# Patient Record
Sex: Male | Born: 1945 | Race: White | Hispanic: No | Smoking: Former smoker
Health system: Southern US, Community
[De-identification: ages and names within clinical notes are randomized; demographics above are authoritative.]

## PROBLEM LIST (undated history)

## (undated) DIAGNOSIS — C801 Malignant (primary) neoplasm, unspecified: Secondary | ICD-10-CM

## (undated) DIAGNOSIS — L309 Dermatitis, unspecified: Secondary | ICD-10-CM

## (undated) DIAGNOSIS — R42 Dizziness and giddiness: Secondary | ICD-10-CM

## (undated) DIAGNOSIS — K635 Polyp of colon: Secondary | ICD-10-CM

## (undated) DIAGNOSIS — I639 Cerebral infarction, unspecified: Secondary | ICD-10-CM

## (undated) DIAGNOSIS — I6529 Occlusion and stenosis of unspecified carotid artery: Secondary | ICD-10-CM

## (undated) DIAGNOSIS — I10 Essential (primary) hypertension: Secondary | ICD-10-CM

## (undated) DIAGNOSIS — J45909 Unspecified asthma, uncomplicated: Secondary | ICD-10-CM

## (undated) DIAGNOSIS — J9819 Other pulmonary collapse: Secondary | ICD-10-CM

## (undated) DIAGNOSIS — K219 Gastro-esophageal reflux disease without esophagitis: Secondary | ICD-10-CM

## (undated) DIAGNOSIS — J302 Other seasonal allergic rhinitis: Secondary | ICD-10-CM

## (undated) DIAGNOSIS — M1990 Unspecified osteoarthritis, unspecified site: Secondary | ICD-10-CM

## (undated) HISTORY — PX: HERNIA REPAIR: SHX51

## (undated) HISTORY — DX: Cerebral infarction, unspecified: I63.9

## (undated) HISTORY — DX: Occlusion and stenosis of unspecified carotid artery: I65.29

## (undated) HISTORY — PX: OTHER SURGICAL HISTORY: SHX169

## (undated) HISTORY — DX: Polyp of colon: K63.5

## (undated) HISTORY — DX: Gastro-esophageal reflux disease without esophagitis: K21.9

## (undated) HISTORY — DX: Unspecified osteoarthritis, unspecified site: M19.90

## (undated) HISTORY — PX: PROSTATE SURGERY: SHX751

## (undated) HISTORY — DX: Dermatitis, unspecified: L30.9

## (undated) HISTORY — DX: Dizziness and giddiness: R42

---

## 2007-01-15 DIAGNOSIS — R972 Elevated prostate specific antigen [PSA]: Secondary | ICD-10-CM | POA: Insufficient documentation

## 2009-01-11 DIAGNOSIS — M129 Arthropathy, unspecified: Secondary | ICD-10-CM | POA: Insufficient documentation

## 2009-07-23 DIAGNOSIS — R310 Gross hematuria: Secondary | ICD-10-CM | POA: Insufficient documentation

## 2009-07-27 DIAGNOSIS — N281 Cyst of kidney, acquired: Secondary | ICD-10-CM | POA: Insufficient documentation

## 2009-08-09 DIAGNOSIS — N35919 Unspecified urethral stricture, male, unspecified site: Secondary | ICD-10-CM | POA: Insufficient documentation

## 2009-08-09 DIAGNOSIS — R319 Hematuria, unspecified: Secondary | ICD-10-CM | POA: Insufficient documentation

## 2011-06-27 DIAGNOSIS — K429 Umbilical hernia without obstruction or gangrene: Secondary | ICD-10-CM | POA: Insufficient documentation

## 2011-08-09 DIAGNOSIS — Z860101 Personal history of adenomatous and serrated colon polyps: Secondary | ICD-10-CM | POA: Insufficient documentation

## 2012-02-17 ENCOUNTER — Emergency Department (HOSPITAL_COMMUNITY): Payer: Medicare HMO

## 2012-02-17 ENCOUNTER — Emergency Department (HOSPITAL_COMMUNITY)
Admission: EM | Admit: 2012-02-17 | Discharge: 2012-02-17 | Disposition: A | Discharge status: Home / Self Care | Payer: Medicare HMO | Attending: Emergency Medicine | Admitting: Emergency Medicine

## 2012-02-17 ENCOUNTER — Encounter (HOSPITAL_COMMUNITY): Payer: Self-pay

## 2012-02-17 DIAGNOSIS — J329 Chronic sinusitis, unspecified: Secondary | ICD-10-CM

## 2012-02-17 DIAGNOSIS — Z8709 Personal history of other diseases of the respiratory system: Secondary | ICD-10-CM | POA: Insufficient documentation

## 2012-02-17 DIAGNOSIS — I10 Essential (primary) hypertension: Secondary | ICD-10-CM | POA: Insufficient documentation

## 2012-02-17 DIAGNOSIS — Z79899 Other long term (current) drug therapy: Secondary | ICD-10-CM | POA: Insufficient documentation

## 2012-02-17 DIAGNOSIS — Z5189 Encounter for other specified aftercare: Secondary | ICD-10-CM

## 2012-02-17 DIAGNOSIS — J45909 Unspecified asthma, uncomplicated: Secondary | ICD-10-CM | POA: Insufficient documentation

## 2012-02-17 DIAGNOSIS — L089 Local infection of the skin and subcutaneous tissue, unspecified: Secondary | ICD-10-CM

## 2012-02-17 DIAGNOSIS — J019 Acute sinusitis, unspecified: Secondary | ICD-10-CM | POA: Insufficient documentation

## 2012-02-17 HISTORY — DX: Unspecified asthma, uncomplicated: J45.909

## 2012-02-17 HISTORY — DX: Essential (primary) hypertension: I10

## 2012-02-17 HISTORY — DX: Other pulmonary collapse: J98.19

## 2012-02-17 HISTORY — DX: Other seasonal allergic rhinitis: J30.2

## 2012-02-17 LAB — CBC WITH DIFFERENTIAL/PLATELET
HCT: 42.3 % (ref 39.0–52.0)
Hemoglobin: 14.3 g/dL (ref 13.0–17.0)
Lymphocytes Relative: 12 % (ref 12–46)
Lymphs Abs: 1 10*3/uL (ref 0.7–4.0)
Monocytes Absolute: 1 10*3/uL (ref 0.1–1.0)
Monocytes Relative: 12 % (ref 3–12)
Neutro Abs: 6.2 10*3/uL (ref 1.7–7.7)
WBC: 8.4 10*3/uL (ref 4.0–10.5)

## 2012-02-17 LAB — BASIC METABOLIC PANEL
CO2: 25 mEq/L (ref 19–32)
Chloride: 97 mEq/L (ref 96–112)
Glucose, Bld: 101 mg/dL — ABNORMAL HIGH (ref 70–99)
Sodium: 132 mEq/L — ABNORMAL LOW (ref 135–145)

## 2012-02-17 LAB — MRSA PCR SCREENING: MRSA by PCR: NEGATIVE

## 2012-02-17 MED ORDER — MUPIROCIN CALCIUM 2 % EX CREA: TOPICAL_CREAM | Freq: Three times a day (TID) | CUTANEOUS | Status: DC

## 2012-02-17 MED ORDER — HYDROXYZINE HCL 25 MG PO TABS: 25.0000 mg | ORAL_TABLET | Freq: Four times a day (QID) | ORAL | Status: DC

## 2012-02-17 MED ORDER — HYDROXYZINE HCL 25 MG PO TABS
25.0000 mg | ORAL_TABLET | Freq: Once | ORAL | Status: AC
  Administered 2012-02-17: 25 mg via ORAL
  Filled 2012-02-17: qty 1

## 2012-02-17 MED ORDER — IOHEXOL 300 MG/ML  SOLN
80.0000 mL | Freq: Once | INTRAMUSCULAR | Status: AC | PRN
  Administered 2012-02-17: 80 mL via INTRAVENOUS

## 2012-02-17 MED ORDER — SODIUM CHLORIDE 0.9 % IV SOLN
Freq: Once | INTRAVENOUS | Status: AC
  Administered 2012-02-17: 11:00:00 via INTRAVENOUS

## 2012-02-17 NOTE — ED Notes (Signed)
Sent by pmd for wound check, pt has wound to face, has been treated by multiple antibiotics, has wound that started out in nose and appears to be spreading to face and into lip area. Wbc's are getting high.

## 2012-02-17 NOTE — ED Notes (Signed)
Patient states that he was referred to the ER from Dr. Gardiner Coins office.  States that he seen his doctor this am, yesterday and last Wednesday.  States that he has been on several different medications, Bactrim, Kelflex, (Valtrex and Rocephin yesterday).

## 2012-02-17 NOTE — ED Provider Notes (Signed)
History     CSN: [id number]  Arrival date & time 02/17/12  0921   First MD Initiated Contact with Patient 02/17/12 [phone]      Chief Complaint  Patient presents with  . Wound Check    (Consider location/radiation/quality/duration/timing/severity/associated sxs/prior treatment) Patient is a 67 y.o. male presenting with wound check. The history is provided by the patient.  Wound Check This is a new problem. The problem has been gradually worsening.    Past Medical History  Diagnosis Date  . Hypertension   . Asthma   . Collapse of lung   . Seasonal allergies     Past Surgical History  Procedure Laterality Date  . Hernia repair    . Prostate surgery      No family history on file.  History  Substance Use Topics  . Smoking status: Never Smoker   . Smokeless tobacco: Not on file  . Alcohol Use: No      Review of Systems  Allergies  Review of patient's allergies indicates not on file.  Home Medications  No current outpatient prescriptions on file.  BP 140/74  Pulse 88  Temp(Src) 98.3 F (36.8 C) (Oral)  Resp 18  Ht 5\' 9"  (1.753 m)  Wt 170 lb (77.111 kg)  BMI 25.09 kg/m2  SpO2 99%  Physical Exam  Nursing note and vitals reviewed. Constitutional: He is oriented to person, place, and time. He appears well-developed and well-nourished.  Non-toxic appearance.  HENT:  Head: Normocephalic.  Right Ear: Tympanic membrane and external ear normal.  Left Ear: Tympanic membrane and external ear normal.  Pt has a small wound that has scabbed in the left nare. No active bleeding.The nose is sore There is increase redness of the face and upper lip. The face is warm but not hot. No ear involvement.  Eyes: Conjunctivae, EOM and lids are normal. Pupils are equal, round, and reactive to light. Right eye exhibits no discharge. Left eye exhibits no discharge.  Neck: Normal range of motion. Neck supple. Carotid bruit is not present.  Cardiovascular: Normal rate, regular  rhythm, normal heart sounds, intact distal pulses and normal pulses.   Pulmonary/Chest: Breath sounds normal. No respiratory distress.  Abdominal: Soft. Bowel sounds are normal. There is no tenderness. There is no guarding.  Musculoskeletal: Normal range of motion.  Lymphadenopathy:       Head (right side): No submandibular adenopathy present.       Head (left side): No submandibular adenopathy present.    He has no cervical adenopathy.  Neurological: He is alert and oriented to person, place, and time. He has normal strength. No cranial nerve deficit or sensory deficit.  Skin: Skin is warm and dry.  Psychiatric: He has a normal mood and affect. His speech is normal.    ED Course  Procedures (including critical care time)  Labs Reviewed - No data to display No results found.   No diagnosis found.    MDM  I have reviewed nursing notes, vital signs, and all appropriate lab and imaging results for this patient. MRSA screen is negative.(pt has been on 3 days of septra)  CBC wnl.  CT Maxillofacial  Questions soft tissue swelling of the chin. Sinusitis noted, but no air fluid levels. No abscess. Plan to finish the septra. Add bactroban to face and chin. Vistaril for itching. Pt to return if not improving.        Kathie Dike, [place] 02/19/12 [phone]

## 2012-02-19 NOTE — ED Provider Notes (Signed)
Medical screening examination/treatment/procedure(s) were performed by non-physician practitioner and as supervising physician I was immediately available for consultation/collaboration.   Carleene Cooper III, MD 02/19/12 [phone]

## 2012-04-16 ENCOUNTER — Other Ambulatory Visit: Payer: Self-pay | Admitting: *Deleted

## 2012-04-16 MED ORDER — OMEPRAZOLE 20 MG PO CPDR: 20.0000 mg | DELAYED_RELEASE_CAPSULE | Freq: Two times a day (BID) | ORAL | Status: DC

## 2012-04-16 NOTE — Telephone Encounter (Signed)
Requesting refill. Original rx given on 03-19-12 by [facility] for #[id number]. Please advise

## 2012-05-20 ENCOUNTER — Ambulatory Visit (INDEPENDENT_AMBULATORY_CARE_PROVIDER_SITE_OTHER): Payer: Medicare HMO | Admitting: Nurse Practitioner

## 2012-05-20 ENCOUNTER — Encounter: Payer: Self-pay | Admitting: Nurse Practitioner

## 2012-05-20 VITALS — BP 135/77 | HR 64 | Temp 97.0°F | Ht 69.0 in | Wt 175.0 lb

## 2012-05-20 DIAGNOSIS — K2970 Gastritis, unspecified, without bleeding: Secondary | ICD-10-CM

## 2012-05-20 DIAGNOSIS — B9681 Helicobacter pylori [H. pylori] as the cause of diseases classified elsewhere: Secondary | ICD-10-CM

## 2012-05-20 DIAGNOSIS — K219 Gastro-esophageal reflux disease without esophagitis: Secondary | ICD-10-CM

## 2012-05-20 DIAGNOSIS — H8110 Benign paroxysmal vertigo, unspecified ear: Secondary | ICD-10-CM

## 2012-05-20 DIAGNOSIS — J45909 Unspecified asthma, uncomplicated: Secondary | ICD-10-CM

## 2012-05-20 DIAGNOSIS — I10 Essential (primary) hypertension: Secondary | ICD-10-CM

## 2012-05-20 DIAGNOSIS — A048 Other specified bacterial intestinal infections: Secondary | ICD-10-CM

## 2012-05-20 DIAGNOSIS — M1990 Unspecified osteoarthritis, unspecified site: Secondary | ICD-10-CM

## 2012-05-20 DIAGNOSIS — N400 Enlarged prostate without lower urinary tract symptoms: Secondary | ICD-10-CM

## 2012-05-20 DIAGNOSIS — L309 Dermatitis, unspecified: Secondary | ICD-10-CM | POA: Insufficient documentation

## 2012-05-20 DIAGNOSIS — L259 Unspecified contact dermatitis, unspecified cause: Secondary | ICD-10-CM

## 2012-05-20 DIAGNOSIS — L4050 Arthropathic psoriasis, unspecified: Secondary | ICD-10-CM | POA: Insufficient documentation

## 2012-05-20 DIAGNOSIS — E291 Testicular hypofunction: Secondary | ICD-10-CM

## 2012-05-20 LAB — COMPLETE METABOLIC PANEL WITH GFR
ALT: 22 U/L (ref 0–53)
BUN: 17 mg/dL (ref 6–23)
CO2: 28 mEq/L (ref 19–32)
Calcium: 10.7 mg/dL — ABNORMAL HIGH (ref 8.4–10.5)
Chloride: 98 mEq/L (ref 96–112)
Creat: 0.96 mg/dL (ref 0.50–1.35)
GFR, Est African American: >89 mL/min
Glucose, Bld: 98 mg/dL (ref 70–99)

## 2012-05-20 MED ORDER — FLUTICASONE PROPIONATE 50 MCG/ACT NA SUSP: 2.0000 | Freq: Two times a day (BID) | NASAL | Status: DC

## 2012-05-20 MED ORDER — LOSARTAN POTASSIUM-HCTZ 100-25 MG PO TABS: 1.0000 | ORAL_TABLET | Freq: Every day | ORAL | Status: DC

## 2012-05-20 MED ORDER — TRAMADOL HCL 50 MG PO TABS: 50.0000 mg | ORAL_TABLET | Freq: Four times a day (QID) | ORAL | Status: DC | PRN

## 2012-05-20 MED ORDER — MECLIZINE HCL 25 MG PO TABS: 25.0000 mg | ORAL_TABLET | Freq: Three times a day (TID) | ORAL | Status: DC | PRN

## 2012-05-20 MED ORDER — CLOBETASOL PROPIONATE 0.05 % EX CREA: 1.0000 "application " | TOPICAL_CREAM | Freq: Every day | CUTANEOUS | Status: DC | PRN

## 2012-05-20 MED ORDER — TESTOSTERONE 10 MG/ACT (2%) TD GEL: 1.0000 "application " | Freq: Every day | TRANSDERMAL | Status: DC

## 2012-05-20 MED ORDER — OMEPRAZOLE 20 MG PO CPDR: 20.0000 mg | DELAYED_RELEASE_CAPSULE | Freq: Two times a day (BID) | ORAL | Status: DC

## 2012-05-20 NOTE — Patient Instructions (Signed)
Health Maintenance, Males A healthy lifestyle and preventative care can promote health and wellness.  Maintain regular health, dental, and eye exams.  Eat a healthy diet. Foods like vegetables, fruits, whole grains, low-fat dairy products, and lean protein foods contain the nutrients you need without too many calories. Decrease your intake of foods high in solid fats, added sugars, and salt. Get information about a proper diet from your caregiver, if necessary.  Regular physical exercise is one of the most important things you can do for your health. Most adults should get at least 150 minutes of moderate-intensity exercise (any activity that increases your heart rate and causes you to sweat) each week. In addition, most adults need muscle-strengthening exercises on 2 or more days a week.   Maintain a healthy weight. The body mass index (BMI) is a screening tool to identify possible weight problems. It provides an estimate of body fat based on height and weight. Your caregiver can help determine your BMI, and can help you achieve or maintain a healthy weight. For adults 20 years and older:  A BMI below 18.5 is considered underweight.  A BMI of 18.5 to 24.9 is normal.  A BMI of 25 to 29.9 is considered overweight.  A BMI of 30 and above is considered obese.  Maintain normal blood lipids and cholesterol by exercising and minimizing your intake of saturated fat. Eat a balanced diet with plenty of fruits and vegetables. Blood tests for lipids and cholesterol should begin at age 20 and be repeated every 5 years. If your lipid or cholesterol levels are high, you are over 50, or you are a high risk for heart disease, you may need your cholesterol levels checked more frequently.Ongoing high lipid and cholesterol levels should be treated with medicines, if diet and exercise are not effective.  If you smoke, find out from your caregiver how to quit. If you do not use tobacco, do not start.  If you  choose to drink alcohol, do not exceed 2 drinks per day. One drink is considered to be 12 ounces (355 mL) of beer, 5 ounces (148 mL) of wine, or 1.5 ounces (44 mL) of liquor.  Avoid use of street drugs. Do not share needles with anyone. Ask for help if you need support or instructions about stopping the use of drugs.  High blood pressure causes heart disease and increases the risk of stroke. Blood pressure should be checked at least every 1 to 2 years. Ongoing high blood pressure should be treated with medicines if weight loss and exercise are not effective.  If you are 45 to 69 years old, ask your caregiver if you should take aspirin to prevent heart disease.  Diabetes screening involves taking a blood sample to check your fasting blood sugar level. This should be done once every 3 years, after age 45, if you are within normal weight and without risk factors for diabetes. Testing should be considered at a younger age or be carried out more frequently if you are overweight and have at least 1 risk factor for diabetes.  Colorectal cancer can be detected and often prevented. Most routine colorectal cancer screening begins at the age of 50 and continues through age 75. However, your caregiver may recommend screening at an earlier age if you have risk factors for colon cancer. On a yearly basis, your caregiver may provide home test kits to check for hidden blood in the stool. Use of a small camera at the end of a tube,   to directly examine the colon (sigmoidoscopy or colonoscopy), can detect the earliest forms of colorectal cancer. Talk to your caregiver about this at age 50, when routine screening begins. Direct examination of the colon should be repeated every 5 to 10 years through age 75, unless early forms of pre-cancerous polyps or small growths are found.  Hepatitis C blood testing is recommended for all people born from 1945 through 1965 and any individual with known risks for hepatitis C.  Healthy  men should no longer receive prostate-specific antigen (PSA) blood tests as part of routine cancer screening. Consult with your caregiver about prostate cancer screening.  Testicular cancer screening is not recommended for adolescents or adult males who have no symptoms. Screening includes self-exam, caregiver exam, and other screening tests. Consult with your caregiver about any symptoms you have or any concerns you have about testicular cancer.  Practice safe sex. Use condoms and avoid high-risk sexual practices to reduce the spread of sexually transmitted infections (STIs).  Use sunscreen with a sun protection factor (SPF) of 30 or greater. Apply sunscreen liberally and repeatedly throughout the day. You should seek shade when your shadow is shorter than you. Protect yourself by wearing long sleeves, pants, a wide-brimmed hat, and sunglasses year round, whenever you are outdoors.  Notify your caregiver of new moles or changes in moles, especially if there is a change in shape or color. Also notify your caregiver if a mole is larger than the size of a pencil eraser.  A one-time screening for abdominal aortic aneurysm (AAA) and surgical repair of large AAAs by sound wave imaging (ultrasonography) is recommended for ages 65 to 75 years who are current or former smokers.  Stay current with your immunizations. Document Released: 06/24/2007 Document Revised: 03/20/2011 Document Reviewed: 05/23/2010 [employer] Patient Information 2013 [employer].  

## 2012-05-20 NOTE — Progress Notes (Signed)
Subjective:    Patient ID: Kamerin Grumbine, male    DOB: 1945-05-24, 67 y.o.   MRN: [id number]  Hypertension This is a chronic problem. The current episode started more than 1 year ago. The problem is unchanged. The problem is uncontrolled. Associated symptoms include peripheral edema. Pertinent negatives include no chest pain, headaches, neck pain or shortness of breath. There are no associated agents to hypertension. Risk factors for coronary artery disease include male gender. Past treatments include angiotensin blockers. The current treatment provides significant improvement. There are no compliance problems.   Gastrophageal Reflux He complains of belching. He reports no chest pain, no coughing, no dysphagia, no heartburn, no hoarse voice or no nausea. This is a chronic problem. Episode frequency: but did have a blotting sensation over the weekend. The problem has been unchanged. Pertinent negatives include no melena, muscle weakness or weight loss. He has tried a PPI for the symptoms. The treatment provided significant relief. Past procedures include an EGD (y-6).  allergic rhinitis Patient used flonase in the past. They thought he was having allergic reaction to it. But come to find out it was reaction to face masks that he was wearing. Patient says that he has been sneezing a lot from working outside.  Asthma Aggravated by outside allergens. Worse since being off flonase BPH Recently had a TURP and says that he still is having problems urinating. Eczema Worsening- Wants to see dermatologist Osteo arthritis Worsening- Has pain all the time despite using ultram- Wants to see specialist Vertigo  meclizine helps but patient is out of meds   Review of Systems  Constitutional: Negative for weight loss.  HENT: Negative for hoarse voice and neck pain.   Respiratory: Negative for cough and shortness of breath.   Cardiovascular: Negative for chest pain.  Gastrointestinal: Negative for heartburn,  dysphagia, nausea and melena.  Musculoskeletal: Negative for muscle weakness.  Neurological: Negative for headaches.  All other systems reviewed and are negative.       Objective:   Physical Exam  Constitutional: He is oriented to person, place, and time. He appears well-developed and well-nourished.  HENT:  Head: Normocephalic.  Right Ear: External ear normal.  Left Ear: External ear normal.  Nose: Nose normal.  Mouth/Throat: Oropharynx is clear and moist.  Eyes: EOM are normal. Pupils are equal, round, and reactive to light.  Neck: Normal range of motion. Neck supple. No thyromegaly present.  Cardiovascular: Normal rate, regular rhythm, normal heart sounds and intact distal pulses.   No murmur heard. Pulmonary/Chest: Effort normal and breath sounds normal. He has no wheezes. He has no rales.  Abdominal: Soft. Bowel sounds are normal.  Musculoskeletal: Normal range of motion.  Neurological: He is alert and oriented to person, place, and time.  Skin: Skin is warm and dry.  Psychiatric: He has a normal mood and affect. His behavior is normal. Judgment and thought content normal.   BP 135/77  Pulse 64  Temp(Src) 97 F (36.1 C) (Oral)  Ht 5\' 9"  (1.753 m)  Wt 175 lb (79.379 kg)  BMI 25.83 kg/m2        Assessment & Plan:  1. GERD (gastroesophageal reflux disease) Avoid spicy and fatty foods Do not eat 2 hours prior to bedtime - omeprazole (PRILOSEC) 20 MG capsule; Take 1 capsule (20 mg total) by mouth 2 (two) times daily.  Dispense: 30 capsule; Refill: 2  2. Hypertension Low Na+ diet encouraged - losartan-hydrochlorothiazide (HYZAAR) 100-25 MG per tablet; Take 1 tablet by mouth daily.  Dispense: 30 tablet; Refill: 5 - COMPLETE METABOLIC PANEL WITH GFR - NMR Lipoprofile with Lipids  3. Eczema Avoid scratching  Avoid hot showers or baths - clobetasol cream (TEMOVATE) 0.05 %; Apply 1 application topically daily as needed (for areas on back).  Dispense: 30 g; Refill:  5 - Ambulatory referral to Dermatology  4. Asthma, chronic Avoid allergens - fluticasone (FLONASE) 50 MCG/ACT nasal spray; Place 2 sprays into the nose 2 (two) times daily.  Dispense: 1 g; Refill: 5  5. BPH (benign prostatic hyperplasia) Follow up withurologist  6. Osteoarthritis Rest when hurting - traMADol (ULTRAM) 50 MG tablet; Take 1 tablet (50 mg total) by mouth every 6 (six) hours as needed for pain.  Dispense: 30 tablet; Refill: 1 - Ambulatory referral to Rheumatology  7. Benign paroxysmal positional vertigo Force fluids - meclizine (ANTIVERT) 25 MG tablet; Take 1 tablet (25 mg total) by mouth 3 (three) times daily as needed.  Dispense: 30 tablet; Refill: 3  9. Hypogonadism male  - Testosterone 10 MG/ACT (2%) GEL; Place 1 application onto the skin daily.  Dispense: 60 g; Refill: 3  Mary-Margaret Daphine Deutscher, FNP

## 2012-05-22 ENCOUNTER — Other Ambulatory Visit: Payer: Self-pay | Admitting: Nurse Practitioner

## 2012-05-22 LAB — NMR LIPOPROFILE WITH LIPIDS
HDL Particle Number: 31.4 umol/L (ref >=30.5)
LDL (calc): 111 mg/dL — ABNORMAL HIGH (ref <100)
LDL Particle Number: 1671 nmol/L — ABNORMAL HIGH (ref <1000)
LDL Size: 20.6 nm (ref >20.5)
LP-IR Score: 76 — ABNORMAL HIGH (ref <=45)
Small LDL Particle Number: 749 nmol/L — ABNORMAL HIGH (ref <=527)

## 2012-05-22 MED ORDER — ATORVASTATIN CALCIUM 40 MG PO TABS: 40.0000 mg | ORAL_TABLET | Freq: Every day | ORAL | Status: DC

## 2012-06-15 ENCOUNTER — Other Ambulatory Visit: Payer: Self-pay | Admitting: Nurse Practitioner

## 2012-07-24 ENCOUNTER — Other Ambulatory Visit: Payer: Self-pay | Admitting: Nurse Practitioner

## 2012-07-25 ENCOUNTER — Other Ambulatory Visit: Payer: Self-pay | Admitting: Nurse Practitioner

## 2012-07-25 NOTE — Telephone Encounter (Signed)
Last seen and filled 05/20/12

## 2012-08-27 ENCOUNTER — Other Ambulatory Visit: Payer: Self-pay | Admitting: Nurse Practitioner

## 2012-08-28 NOTE — Telephone Encounter (Signed)
rx ready for pickup 

## 2012-08-28 NOTE — Telephone Encounter (Signed)
Last seen 05/20/12 MMM Last filled 07/25/12   If approved print and route to nurse

## 2012-08-29 NOTE — Telephone Encounter (Signed)
Called to [facility] 

## 2012-09-30 ENCOUNTER — Other Ambulatory Visit: Payer: Self-pay | Admitting: Nurse Practitioner

## 2012-10-01 NOTE — Telephone Encounter (Signed)
Last seen 05/20/12, last filled 08/27/12, Rx will print, nurse to call pt to pickup

## 2012-10-01 NOTE — Telephone Encounter (Signed)
Last refill without being seen 

## 2012-10-03 ENCOUNTER — Other Ambulatory Visit: Payer: Self-pay | Admitting: Nurse Practitioner

## 2012-10-03 DIAGNOSIS — E291 Testicular hypofunction: Secondary | ICD-10-CM

## 2012-10-03 NOTE — Telephone Encounter (Signed)
Last seen 05/20/12  Rodney Hernandez  If approved print and route to nurse

## 2012-10-03 NOTE — Telephone Encounter (Signed)
Up front 

## 2012-10-04 ENCOUNTER — Telehealth: Payer: Self-pay | Admitting: *Deleted

## 2012-10-04 MED ORDER — TESTOSTERONE 10 MG/ACT (2%) TD GEL: 1.0000 "application " | Freq: Every day | TRANSDERMAL | Status: DC

## 2012-10-04 NOTE — Telephone Encounter (Signed)
Script for Tramadol ready but number busy.

## 2012-10-07 ENCOUNTER — Telehealth: Payer: Self-pay | Admitting: Nurse Practitioner

## 2012-10-07 DIAGNOSIS — E291 Testicular hypofunction: Secondary | ICD-10-CM

## 2012-10-08 MED ORDER — TESTOSTERONE 10 MG/ACT (2%) TD GEL: 1.0000 "application " | Freq: Every day | TRANSDERMAL | Status: DC

## 2012-10-08 NOTE — Telephone Encounter (Signed)
Testosterone rx ready for pick up  

## 2012-10-08 NOTE — Telephone Encounter (Signed)
Number keeps RX up front

## 2012-10-10 ENCOUNTER — Telehealth: Payer: Self-pay | Admitting: Nurse Practitioner

## 2012-10-10 NOTE — Telephone Encounter (Signed)
Patient aware.

## 2012-10-14 ENCOUNTER — Telehealth: Payer: Self-pay | Admitting: General Practice

## 2012-10-14 NOTE — Telephone Encounter (Signed)
Should be 1 application daily

## 2012-10-14 NOTE — Telephone Encounter (Signed)
Pharmacy aware

## 2012-11-06 NOTE — Telephone Encounter (Signed)
No response from patient

## 2012-11-11 ENCOUNTER — Other Ambulatory Visit: Payer: Self-pay | Admitting: Nurse Practitioner

## 2012-11-29 ENCOUNTER — Other Ambulatory Visit: Payer: Self-pay | Admitting: Nurse Practitioner

## 2012-12-04 ENCOUNTER — Telehealth: Payer: Self-pay | Admitting: Nurse Practitioner

## 2012-12-04 MED ORDER — TRAMADOL HCL 50 MG PO TABS: 50.0000 mg | ORAL_TABLET | Freq: Two times a day (BID) | ORAL | Status: DC | PRN

## 2012-12-04 NOTE — Telephone Encounter (Signed)
rx ready for pickup 

## 2012-12-04 NOTE — Telephone Encounter (Signed)
Called patient; aware. 

## 2012-12-10 ENCOUNTER — Other Ambulatory Visit: Payer: Self-pay | Admitting: Nurse Practitioner

## 2012-12-13 ENCOUNTER — Ambulatory Visit (INDEPENDENT_AMBULATORY_CARE_PROVIDER_SITE_OTHER): Payer: Medicare HMO | Admitting: Nurse Practitioner

## 2012-12-13 ENCOUNTER — Encounter: Payer: Self-pay | Admitting: Nurse Practitioner

## 2012-12-13 VITALS — BP 155/83 | HR 76 | Temp 97.0°F | Ht 69.0 in | Wt 176.0 lb

## 2012-12-13 DIAGNOSIS — A048 Other specified bacterial intestinal infections: Secondary | ICD-10-CM

## 2012-12-13 DIAGNOSIS — L4050 Arthropathic psoriasis, unspecified: Secondary | ICD-10-CM

## 2012-12-13 DIAGNOSIS — B9681 Helicobacter pylori [H. pylori] as the cause of diseases classified elsewhere: Secondary | ICD-10-CM

## 2012-12-13 MED ORDER — CLARITHROMYCIN 500 MG PO TABS: 500.0000 mg | ORAL_TABLET | Freq: Two times a day (BID) | ORAL | Status: DC

## 2012-12-13 MED ORDER — LANSOPRAZOLE 30 MG PO CPDR: 30.0000 mg | DELAYED_RELEASE_CAPSULE | Freq: Every day | ORAL | Status: DC

## 2012-12-13 MED ORDER — AMOXICILLIN 875 MG PO TABS: 875.0000 mg | ORAL_TABLET | Freq: Two times a day (BID) | ORAL | Status: DC

## 2012-12-13 NOTE — Patient Instructions (Signed)
Helicobacter Pylori Antibodies Test °This is a blood test which looks for a germ called Helicobacter pylori. This can also be diagnosed by a breath test or a microscopic examination of a portion (biopsy) of the small bowel. H. pylori is a germ that is found in the cells that line the stomach. It is a risk factor for stomach and small bowel ulcers, long standing inflammation of the lining of the stomach, or even ulcers that may occur in the esophagus (the canal that runs from the mouth to the stomach). This bacterium is also a factor in stomach cancer. The amount of the bacteria is found in about 10% of healthy persons younger than 69 years of age and the amount of the bacteria increases with age. Most persons with these bacteria have no symptoms; however, it is thought that when these bacteria cause ulcers, antibiotic medications can be used to help eliminate or reduce the problem.  °PREPARATION FOR TEST °No preparation or fasting is necessary. °NORMAL FINDINGS °Negative (H-pylori bacteria not present) °Ranges for normal findingsmay vary among different laboratories and hospitals. You should always check with your doctor after having lab work or other tests done to discuss the meaning of your test results and whether or not your values are considered within normal limits. °MEANING OF TEST  °Your caregiver will go over the test results with you and discuss the importance and meaning of your results, as well as treatment options and the need for additional tests if necessary. °It is your responsibility to obtain your test results. Ask the lab or department performing the test when and how you will get your results. °OBTAINING THE TEST RESULTS °It is your responsibility to obtain your test results. Ask the lab or department performing the test when and how you will get your results. °Document Released: 01/20/2004 Document Revised: 03/20/2011 Document Reviewed: 12/07/2007 °[employer]® Patient Information ©2014 [employer]. ° °

## 2012-12-13 NOTE — Progress Notes (Signed)
   Subjective:    Patient ID: Rodney Hernandez, male    DOB: [birth year], 67 y.o.   MRN: [id number]  HPI Patient in wanting a GI referral- Has had hpylori and took  prevpak- patient thinks it has come back- has bad gastric reflux and is very gassy- this past week symptoms have gotten worse. Symptoms are exactly the same as they were when was positive for hpylori.    Review of Systems  Constitutional: Negative.   HENT: Negative.   Respiratory: Negative.   Cardiovascular: Negative.   Gastrointestinal: Positive for nausea. Negative for abdominal pain, blood in stool and abdominal distention.  Genitourinary: Negative.   All other systems reviewed and are negative.       Objective:   Physical Exam  Constitutional: He appears well-developed and well-nourished.  Cardiovascular: Normal rate and normal heart sounds.   Pulmonary/Chest: Effort normal and breath sounds normal.  Abdominal: Soft. Bowel sounds are normal. He exhibits no mass. There is no tenderness. There is no rebound and no guarding.     BP 155/83  Pulse 76  Temp(Src) 97 F (36.1 C) (Oral)  Ht 5\' 9"  (1.753 m)  Wt 176 lb (79.833 kg)  BMI 25.98 kg/m2      Assessment & Plan:   1. Gastritis, Helicobacter pylori   2. Psoriatic arthritis    Orders Placed This Encounter  Procedures  . Ambulatory referral to Rheumatology    Referral Priority:  Routine    Referral Type:  Consultation    Referral Reason:  Specialty Services Required    Requested Specialty:  Rheumatology    Number of Visits Requested:  1   Meds ordered this encounter  Medications  . folic acid (FOLVITE) 1 MG tablet    Sig:   . methotrexate 25 MG/ML injection    Sig: Inject 50 mg/m2 as directed once a week.   . lansoprazole (PREVACID) 30 MG capsule    Sig: Take 1 capsule (30 mg total) by mouth daily at 12 noon.    Dispense:  28 capsule    Refill:  0    Order Specific Question:  Supervising Provider    Answer:  Ernestina Penna [[id number]]  . clarithromycin  (BIAXIN) 500 MG tablet    Sig: Take 1 tablet (500 mg total) by mouth 2 (two) times daily.    Dispense:  28 tablet    Refill:  0    Order Specific Question:  Supervising Provider    Answer:  Ernestina Penna [[id number]]  . amoxicillin (AMOXIL) 875 MG tablet    Sig: Take 1 tablet (875 mg total) by mouth 2 (two) times daily.    Dispense:  28 tablet    Refill:  0    Order Specific Question:  Supervising Provider    Answer:  Deborra Medina    Continue all meds Labs pending Diet and exercise encouraged Health maintenance reviewed Follow up in 3 months  Mary-Margaret Daphine Deutscher, FNP

## 2012-12-21 ENCOUNTER — Other Ambulatory Visit: Payer: Self-pay | Admitting: Nurse Practitioner

## 2012-12-25 ENCOUNTER — Telehealth: Payer: Self-pay | Admitting: Nurse Practitioner

## 2013-01-03 ENCOUNTER — Other Ambulatory Visit: Payer: Self-pay | Admitting: Nurse Practitioner

## 2013-01-17 ENCOUNTER — Telehealth: Payer: Self-pay | Admitting: Nurse Practitioner

## 2013-01-20 ENCOUNTER — Ambulatory Visit (INDEPENDENT_AMBULATORY_CARE_PROVIDER_SITE_OTHER): Payer: Medicare HMO | Admitting: Nurse Practitioner

## 2013-01-20 ENCOUNTER — Encounter (INDEPENDENT_AMBULATORY_CARE_PROVIDER_SITE_OTHER): Payer: Self-pay

## 2013-01-20 ENCOUNTER — Encounter: Payer: Self-pay | Admitting: Nurse Practitioner

## 2013-01-20 VITALS — BP 142/73 | HR 67 | Temp 97.0°F | Ht 69.0 in | Wt 176.0 lb

## 2013-01-20 DIAGNOSIS — L259 Unspecified contact dermatitis, unspecified cause: Secondary | ICD-10-CM

## 2013-01-20 DIAGNOSIS — L309 Dermatitis, unspecified: Secondary | ICD-10-CM

## 2013-01-20 DIAGNOSIS — K219 Gastro-esophageal reflux disease without esophagitis: Secondary | ICD-10-CM

## 2013-01-20 DIAGNOSIS — E291 Testicular hypofunction: Secondary | ICD-10-CM

## 2013-01-20 DIAGNOSIS — A048 Other specified bacterial intestinal infections: Secondary | ICD-10-CM

## 2013-01-20 DIAGNOSIS — K2970 Gastritis, unspecified, without bleeding: Secondary | ICD-10-CM

## 2013-01-20 DIAGNOSIS — N400 Enlarged prostate without lower urinary tract symptoms: Secondary | ICD-10-CM

## 2013-01-20 DIAGNOSIS — B9681 Helicobacter pylori [H. pylori] as the cause of diseases classified elsewhere: Secondary | ICD-10-CM

## 2013-01-20 DIAGNOSIS — I10 Essential (primary) hypertension: Secondary | ICD-10-CM

## 2013-01-20 MED ORDER — TESTOSTERONE 10 MG/ACT (2%) TD GEL: 1.0000 "application " | Freq: Every day | TRANSDERMAL | Status: DC

## 2013-01-20 NOTE — Patient Instructions (Signed)

## 2013-01-20 NOTE — Progress Notes (Signed)
Subjective:    Patient ID: Rodney Hernandez, male    DOB: [birth year], 68 y.o.   MRN: [id number]   Patien there today for follow up- he had gastritis at last visit but is much better. No complaints today.  Hypertension This is a chronic problem. The current episode started more than 1 year ago. The problem is unchanged. The problem is uncontrolled. Associated symptoms include peripheral edema. Pertinent negatives include no chest pain, headaches, neck pain or shortness of breath. There are no associated agents to hypertension. Risk factors for coronary artery disease include male gender. Past treatments include angiotensin blockers. The current treatment provides significant improvement. There are no compliance problems.   Gastrophageal Reflux He complains of belching. He reports no chest pain, no coughing, no dysphagia, no heartburn, no hoarse voice or no nausea. This is a chronic problem. Episode frequency: but did have a blotting sensation over the weekend. The problem has been unchanged. Pertinent negatives include no melena, muscle weakness or weight loss. He has tried a PPI for the symptoms. The treatment provided significant relief. Past procedures include an EGD (y-6).  allergic rhinitis Patient used flonase in the past. They thought he was having allergic reaction to it. But come to find out it was reaction to face masks that he was wearing. Patient says that he has been sneezing a lot from working outside.  Asthma Aggravated by outside allergens. Worse since being off flonase BPH Recently had a TURP and says that he still is having problems urinating. Eczema Worsening- Wants to see dermatologist Osteo arthritis Worsening- Has pain all the time despite using ultram- Wants to see specialist Vertigo  meclizine helps but patient is out of meds   Review of Systems  Constitutional: Negative for weight loss.  HENT: Negative for hoarse voice.   Respiratory: Negative for cough and shortness of  breath.   Cardiovascular: Negative for chest pain.  Gastrointestinal: Negative for heartburn, dysphagia, nausea and melena.  Musculoskeletal: Negative for muscle weakness and neck pain.  Neurological: Negative for headaches.  All other systems reviewed and are negative.       Objective:   Physical Exam  Constitutional: He is oriented to person, place, and time. He appears well-developed and well-nourished.  HENT:  Head: Normocephalic.  Right Ear: External ear normal.  Left Ear: External ear normal.  Nose: Nose normal.  Mouth/Throat: Oropharynx is clear and moist.  Eyes: EOM are normal. Pupils are equal, round, and reactive to light.  Neck: Normal range of motion. Neck supple. No thyromegaly present.  Cardiovascular: Normal rate, regular rhythm, normal heart sounds and intact distal pulses.   No murmur heard. Pulmonary/Chest: Effort normal and breath sounds normal. He has no wheezes. He has no rales.  Abdominal: Soft. Bowel sounds are normal.  Musculoskeletal: Normal range of motion.  Neurological: He is alert and oriented to person, place, and time.  Skin: Skin is warm and dry.  Psychiatric: He has a normal mood and affect. His behavior is normal. Judgment and thought content normal.   BP 142/73  Pulse 67  Temp(Src) 97 F (36.1 C) (Oral)  Ht _0  (1.753 m)  Wt 176 lb (79.833 kg)  BMI 25.98 kg/m2        Assessment & Plan:   1. Hypertension   2. Helicobacter positive gastritis   3. GERD (gastroesophageal reflux disease)   4. Eczema   5. 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  CMP14+EGFR  . NMR, lipoprofile   Meds ordered this encounter  Medications  . Testosterone 10 MG/ACT (2%) GEL    Sig: Place 1 application onto the skin daily.    Dispense:  60 g    Refill:  2    Order Specific Question:  Supervising Provider    Answer:  Joycelyn Man    Continue all meds Labs pending Diet  and exercise encouraged Health maintenance reviewed Follow up in 3 months  [place], FNP

## 2013-01-22 LAB — NMR, LIPOPROFILE
CHOLESTEROL: 171 mg/dL (ref <200)
HDL Cholesterol by NMR: 45 mg/dL (ref >=40)
HDL Particle Number: 32.3 umol/L (ref >=30.5)
LDL PARTICLE NUMBER: 1156 nmol/L — AB (ref <1000)
LDL SIZE: 20.6 nm (ref >20.5)
LDLC SERPL CALC-MCNC: 89 mg/dL (ref <100)
LP-IR SCORE: 57 — AB (ref <=45)
SMALL LDL PARTICLE NUMBER: 573 nmol/L — AB (ref <=527)
TRIGLYCERIDES BY NMR: 185 mg/dL — AB (ref <150)

## 2013-01-22 LAB — CMP14+EGFR
ALBUMIN: 4.7 g/dL (ref 3.6–4.8)
ALT: 21 IU/L (ref 0–44)
AST: 16 IU/L (ref 0–40)
Albumin/Globulin Ratio: 2.4 (ref 1.1–2.5)
Alkaline Phosphatase: 100 IU/L (ref 39–117)
BUN/Creatinine Ratio: 15 (ref 10–22)
BUN: 17 mg/dL (ref 8–27)
CO2: 26 mmol/L (ref 18–29)
Calcium: 10.4 mg/dL — ABNORMAL HIGH (ref 8.6–10.2)
Chloride: 97 mmol/L (ref 97–108)
Creatinine, Ser: 1.14 mg/dL (ref 0.76–1.27)
GFR calc Af Amer: 75 mL/min/{1.73_m2} (ref >59)
GFR calc non Af Amer: 65 mL/min/{1.73_m2} (ref >59)
GLUCOSE: 91 mg/dL (ref 65–99)
Globulin, Total: 2 g/dL (ref 1.5–4.5)
Potassium: 3.9 mmol/L (ref 3.5–5.2)
Sodium: 138 mmol/L (ref 134–144)
TOTAL PROTEIN: 6.7 g/dL (ref 6.0–8.5)
Total Bilirubin: 0.2 mg/dL (ref 0.0–1.2)

## 2013-01-24 ENCOUNTER — Telehealth: Payer: Self-pay | Admitting: Nurse Practitioner

## 2013-01-28 NOTE — Telephone Encounter (Signed)
Pt seen 01/20/13 at [facility] by [facility]

## 2013-01-29 ENCOUNTER — Other Ambulatory Visit: Payer: Self-pay | Admitting: Nurse Practitioner

## 2013-01-29 DIAGNOSIS — E291 Testicular hypofunction: Secondary | ICD-10-CM

## 2013-01-29 MED ORDER — TESTOSTERONE 10 MG/ACT (2%) TD GEL: TRANSDERMAL | Status: DC

## 2013-01-30 ENCOUNTER — Telehealth: Payer: Self-pay | Admitting: Nurse Practitioner

## 2013-01-30 NOTE — Telephone Encounter (Signed)
Rodney Hernandez has been working on this. Information was sent to rheumatology office but we haven't received a response despite leaving messages to call us back.  I will check with Rodney Hernandez when she returns tomorrow.

## 2013-02-01 ENCOUNTER — Other Ambulatory Visit: Payer: Self-pay | Admitting: Nurse Practitioner

## 2013-02-05 ENCOUNTER — Telehealth: Payer: Self-pay | Admitting: Nurse Practitioner

## 2013-02-05 NOTE — Telephone Encounter (Signed)
Need to kno wwhat they are talking about

## 2013-02-06 NOTE — Telephone Encounter (Signed)
Pt gets Testosterone 10% cream. 1 ml topically to muscle area every morning. 30grams qmonth with 6 month refill. This needs to be okay and [facility] [phone]. The rx keepsbeing sent for gel. He has been on cream since 2012.

## 2013-02-06 NOTE — Telephone Encounter (Signed)
Have them send request electronically- can not find this in epic to order- keeps coming up as gel

## 2013-02-06 NOTE — Telephone Encounter (Signed)
They can't send electronically refills on control meds. The technician at [facility] said they would take verbal if you would ok since pt brought other testosterone  rx to them.

## 2013-02-06 NOTE — Telephone Encounter (Signed)
[employer] notified.

## 2013-02-06 NOTE — Telephone Encounter (Signed)
Ok to give verbal order for testosterone he usually uses

## 2013-03-04 ENCOUNTER — Other Ambulatory Visit: Payer: Self-pay | Admitting: Nurse Practitioner

## 2013-03-07 ENCOUNTER — Other Ambulatory Visit: Payer: Self-pay | Admitting: *Deleted

## 2013-03-17 ENCOUNTER — Ambulatory Visit (INDEPENDENT_AMBULATORY_CARE_PROVIDER_SITE_OTHER): Payer: Medicare HMO | Admitting: Family Medicine

## 2013-03-17 ENCOUNTER — Encounter: Payer: Self-pay | Admitting: Family Medicine

## 2013-03-17 VITALS — BP 128/79 | HR 84 | Temp 98.0°F | Ht 69.0 in | Wt 174.0 lb

## 2013-03-17 DIAGNOSIS — R42 Dizziness and giddiness: Secondary | ICD-10-CM

## 2013-03-17 LAB — POCT CBC
Granulocyte percent: 78.9 %G (ref 37–80)
HCT, POC: 47.5 % (ref 43.5–53.7)
HEMOGLOBIN: 15.1 g/dL (ref 14.1–18.1)
Lymph, poc: 1.5 (ref 0.6–3.4)
MCH: 27.3 pg (ref 27–31.2)
MCHC: 31.7 g/dL — AB (ref 31.8–35.4)
MCV: 85.9 fL (ref 80–97)
MPV: 8.3 fL (ref 0–99.8)
POC Granulocyte: 7.2 — AB (ref 2–6.9)
POC LYMPH PERCENT: 16.8 %L (ref 10–50)
Platelet Count, POC: 380 10*3/uL (ref 142–424)
RBC: 5.5 M/uL (ref 4.69–6.13)
RDW, POC: 16.7 %
WBC: 9.1 10*3/uL (ref 4.6–10.2)

## 2013-03-17 LAB — GLUCOSE, POCT (MANUAL RESULT ENTRY): POC Glucose: 103 mg/dl — AB (ref 70–99)

## 2013-03-17 MED ORDER — MECLIZINE HCL 25 MG PO TABS: 25.0000 mg | ORAL_TABLET | Freq: Three times a day (TID) | ORAL | Status: DC | PRN

## 2013-03-17 NOTE — Progress Notes (Signed)
   Subjective:    Patient ID: Rodney Hernandez, male    DOB: 29-Jun-1945, 68 y.o.   MRN: [id number]  HPI Pt presents today with chief complaint of dizziness.  Pt states that sxs have been present for the past 2-3 days.  Has had recurrent generalized dizziness that has been present both with rest and exertion.  Denies any trauma or recent strenuous activity.  Has had vertiginous sxs in the past. Sxs do not seem to be in similar pattern.  No prior hx/o CVA, CVD in the past.  No hemiparesis, confusion, nausea.  On MTX for PsA Does take androgel for low T On Hyzaar for HTN. BP has been fairly controlled recently.      Review of Systems  All other systems reviewed and are negative.       Objective:   Physical Exam  Constitutional: He appears well-developed and well-nourished.  HENT:  Head: Normocephalic and atraumatic.  Eyes: Conjunctivae are normal. Pupils are equal, round, and reactive to light.  Neck: Normal range of motion. Neck supple.  Cardiovascular: Normal rate and regular rhythm.   Pulmonary/Chest: Effort normal.  Abdominal: Soft.  Musculoskeletal: Normal range of motion.  Neurological: No cranial nerve deficit. Coordination normal.  dix hallpike faintly positive on the L    Skin: Skin is warm.          Assessment & Plan:  Dizziness - Plan: POCT CBC, POCT glucose (manual entry), EKG 12-Lead, MR Brain W Wo Contrast, CANCELED: MR Brain W Wo Contrast  EKG, CBC, CBG, orthostatics WNL This may be recurrence of vertigo, but CVA/TIA is stil on ddx as sxs are not similar to previous vertigo flare. Will start pt on meclizine.  Will obtain MRI brain w/w/o contrast to better assess neuro pathology.  Discussed neuro red flags at length.  Follow up pending imaging.   Clinical update: Insurance will not pay for MRI Pt states that he will take meclizine. If sxs not improved with meclizine, will go to ER for further evaluation.  Discussed Neurovascular red flags with pt.  Expressed understanding.

## 2013-03-18 ENCOUNTER — Other Ambulatory Visit: Payer: Self-pay | Admitting: Family Medicine

## 2013-03-18 ENCOUNTER — Ambulatory Visit (HOSPITAL_COMMUNITY)
Admission: RE | Admit: 2013-03-18 | Discharge: 2013-03-18 | Disposition: A | Discharge status: Home / Self Care | Payer: Medicare HMO | Source: Ambulatory Visit | Attending: Family Medicine | Admitting: Family Medicine

## 2013-03-18 DIAGNOSIS — G319 Degenerative disease of nervous system, unspecified: Secondary | ICD-10-CM | POA: Insufficient documentation

## 2013-03-18 DIAGNOSIS — R42 Dizziness and giddiness: Secondary | ICD-10-CM

## 2013-03-18 DIAGNOSIS — R5383 Other fatigue: Secondary | ICD-10-CM

## 2013-03-18 DIAGNOSIS — R5381 Other malaise: Secondary | ICD-10-CM | POA: Insufficient documentation

## 2013-03-20 ENCOUNTER — Other Ambulatory Visit: Payer: Self-pay | Admitting: *Deleted

## 2013-03-20 DIAGNOSIS — IMO0002 Reserved for concepts with insufficient information to code with codable children: Secondary | ICD-10-CM

## 2013-03-21 ENCOUNTER — Telehealth: Payer: Self-pay | Admitting: Family Medicine

## 2013-03-21 NOTE — Telephone Encounter (Signed)
Discussed results and recommendations with patient.   

## 2013-03-25 ENCOUNTER — Ambulatory Visit (INDEPENDENT_AMBULATORY_CARE_PROVIDER_SITE_OTHER): Payer: Medicare HMO | Admitting: Neurology

## 2013-03-25 ENCOUNTER — Encounter (INDEPENDENT_AMBULATORY_CARE_PROVIDER_SITE_OTHER): Payer: Self-pay

## 2013-03-25 ENCOUNTER — Encounter: Payer: Self-pay | Admitting: Neurology

## 2013-03-25 VITALS — BP 136/84 | HR 83 | Ht 69.0 in | Wt 175.0 lb

## 2013-03-25 DIAGNOSIS — R42 Dizziness and giddiness: Secondary | ICD-10-CM

## 2013-03-25 DIAGNOSIS — I10 Essential (primary) hypertension: Secondary | ICD-10-CM

## 2013-03-25 NOTE — Progress Notes (Signed)
[facility]    Provider:  Dr Janann Colonel Referring Provider: Chipper Herb, MD Primary Care Physician:  Redge Gainer, MD  CC:  Vertigo  HPI:  Rodney Hernandez is a 68 y.o. male here as a referral from Dr. Laurance Flatten for vertigo evaluation. Has not had any symptoms for past 2 days. Most recent symptoms started around 2 weeks ago, noted episodes of feeling light headed, dizzy, off balance, room swimming sensation. Symptoms would come on suddenly, occurs when both still and when moving. Symptoms could be triggered by moving head right or left. Noted that he would veer or tip always to the right side. No nausea or emesis. No palpitations. Symptoms would last a few seconds (10-20s at the most). No improvement with sitting down. Tried meclizine, made him feel strange so he stopped taking it.   In the past had episodes of questionable vertigo 2-3 years ago, felt it was different then these episodes. Back thhen felt sensation of head moving forward. Symptoms lasted around 1 week and then improved with meclizine.   Has history of HTN, HLD. No DM. No tobacco, no EtOH.   MRI brain (without contrast) reviewed showed no structural processes, no noted CPA mass, moderate non specific white matter changes  Review of Systems: Out of a complete 14 system review, the patient complains of only the following symptoms, and all other reviewed systems are negative. + fatigue, blurred vision, easy bruising, confusion, headaches, sleepiness, dizziness  History   Social History  . Marital Status: Widowed    Spouse Name: N/A    Number of Children: 2  . Years of Education: 12   Occupational History  . Not on file.   Social History Main Topics  . Smoking status: Never Smoker   . Smokeless tobacco: Never Used  . Alcohol Use: No  . Drug Use: No  . Sexual Activity: No   Other Topics Concern  . Not on file   Social History Narrative   Widowed, 2 children   Right handed   12th grade   3 cups  daily    Family History  Problem Relation Age of Onset  . Cancer Father     lung ca  . Cancer Paternal Uncle     prostate ca  . Cancer Maternal Grandmother     stomach    Past Medical History  Diagnosis Date  . Hypertension   . Asthma   . Collapse of lung   . Seasonal allergies   . GERD (gastroesophageal reflux disease)   . Eczema   . Vertigo   . Arthritis     psoriatic arthritis and osteo arthritis    Past Surgical History  Procedure Laterality Date  . Hernia repair    . Prostate surgery    . Collapsed lung      Current Outpatient Prescriptions  Medication Sig Dispense Refill  . clobetasol cream (TEMOVATE) 0.05 %       . folic acid (FOLVITE) 1 MG tablet       . losartan-hydrochlorothiazide (HYZAAR) 100-25 MG per tablet TAKE 1 TABLET BY MOUTH DAILY.  30 tablet  5  . methotrexate 25 MG/ML injection Inject 50 mg/m2 as directed once a week.       Marland Kitchen omeprazole (PRILOSEC) 20 MG capsule TAKE 1 CAPSULE BY MOUTH 2 TIMES DAILY  60 capsule  2  . PROAIR HFA 108 (90 BASE) MCG/ACT inhaler INHALE 2 PUFFS 4 TIMES DAILY  8.5 each  5  . sucralfate (CARAFATE)  1 G tablet TAKE 1 TABLET BY MOUTH FOUR TIMES DAILY  120 tablet  2  . Testosterone (ANDROGEL TD) Place onto the skin.      Marland Kitchen traMADol (ULTRAM) 50 MG tablet        No current facility-administered medications for this visit.    Allergies as of 03/25/2013 - Review Complete 03/25/2013  Allergen Reaction Noted  . Nsaids Shortness Of Breath 02/17/2012  . Asa [aspirin]  05/20/2012  . Morphine and related Hives and Itching 02/17/2012  . Sulfa antibiotics  05/20/2012    Vitals: BP 136/84  Pulse 83  Ht 5\' 9"  (1.753 m)  Wt 175 lb (79.379 kg)  BMI 25.83 kg/m2 Last Weight:  Wt Readings from Last 1 Encounters:  03/25/13 175 lb (79.379 kg)   Last Height:   Ht Readings from Last 1 Encounters:  03/25/13 5\' 9"  (1.753 m)     Physical exam: Exam: Gen: NAD, conversant Eyes: anicteric sclerae, moist conjunctivae HENT:  Atraumatic, oropharynx clear Neck: Trachea midline; supple,  Lungs: CTA, no wheezing, rales, rhonic                          CV: RRR, no MRG Abdomen: Soft, non-tender;  Extremities: No peripheral edema  Skin: Normal temperature, no rash,  Psych: Appropriate affect, pleasant  Negative Dix Hallpike  Neuro: MS: AA&Ox3, appropriately interactive, normal affect   Speech: fluent w/o paraphasic error  Memory: good recent and remote recall  CN: PERRL, EOMI no nystagmus, no ptosis, sensation intact to LT V1-V3 bilat, face symmetric, no weakness, hearing grossly intact, palate elevates symmetrically, shoulder shrug 5/5 bilat,  tongue protrudes midline, no fasiculations noted.  Motor: normal bulk and tone Strength: 5/5  In all extremities  Coord: rapid alternating and point-to-point (FNF, HTS) movements intact.  Reflexes: symmetrical, bilat downgoing toes  Sens: LT intact in all extremities  Gait: posture, stance, stride and arm-swing normal. Tandem gait intact. Able to walk on heels and toes. Romberg absent.   Assessment:  After physical and neurologic examination, review of laboratory studies, imaging, neurophysiology testing and pre-existing records, assessment will be reviewed on the problem list.  Plan:  Treatment plan and additional workup will be reviewed under Problem List.  1)Vertigo 2)HTN  67y/o gentleman presenting for initial evaluation of symptoms concerning for a diagnosis of vertigo. Currently asymptomatic with last episode 2 days ago. Has had questionable episode of vertigo in the past. Based on clinical description and history suspect symptoms likely represent BPPV. Differential would also include a vascular etiology vs possible CPA mass. As symptoms have currently resolved would hold off on checking MRA head and/or MRI head with contrast at this time. If symptoms return can consider vestibular rehab referral.White matter changes are nonspecific. Follow up as needed.      Jim Like, DO  [facility] [address] [place], [place] [address]  Phone [phone] Fax [phone]

## 2013-05-12 ENCOUNTER — Other Ambulatory Visit: Payer: Self-pay | Admitting: *Deleted

## 2013-05-12 MED ORDER — SUCRALFATE 1 G PO TABS: 1.0000 g | ORAL_TABLET | Freq: Four times a day (QID) | ORAL | Status: DC

## 2013-06-09 ENCOUNTER — Other Ambulatory Visit: Payer: Self-pay

## 2013-06-09 MED ORDER — OMEPRAZOLE 20 MG PO CPDR: DELAYED_RELEASE_CAPSULE | ORAL | Status: DC

## 2013-06-09 MED ORDER — LOSARTAN POTASSIUM-HCTZ 100-25 MG PO TABS: ORAL_TABLET | ORAL | Status: DC

## 2013-07-08 ENCOUNTER — Other Ambulatory Visit (INDEPENDENT_AMBULATORY_CARE_PROVIDER_SITE_OTHER): Payer: Medicare HMO

## 2013-07-08 DIAGNOSIS — L4050 Arthropathic psoriasis, unspecified: Secondary | ICD-10-CM

## 2013-07-09 LAB — CBC WITH DIFFERENTIAL
Basophils Absolute: 0.1 10*3/uL (ref 0.0–0.2)
Basos: 1 %
EOS ABS: 0.2 10*3/uL (ref 0.0–0.4)
EOS: 2 %
HCT: 43.3 % (ref 37.5–51.0)
Hemoglobin: 14.5 g/dL (ref 12.6–17.7)
IMMATURE GRANS (ABS): 0.1 10*3/uL (ref 0.0–0.1)
Immature Granulocytes: 1 %
LYMPHS ABS: 1.9 10*3/uL (ref 0.7–3.1)
Lymphs: 21 %
MCH: 29.4 pg (ref 26.6–33.0)
MCHC: 33.5 g/dL (ref 31.5–35.7)
MCV: 88 fL (ref 79–97)
MONOS ABS: 1.1 10*3/uL — AB (ref 0.1–0.9)
Monocytes: 12 %
NEUTROS PCT: 63 %
Neutrophils Absolute: 5.8 10*3/uL (ref 1.4–7.0)
Platelets: 386 10*3/uL — ABNORMAL HIGH (ref 150–379)
RBC: 4.94 x10E6/uL (ref 4.14–5.80)
RDW: 15.6 % — ABNORMAL HIGH (ref 12.3–15.4)
WBC: 9.1 10*3/uL (ref 3.4–10.8)

## 2013-07-09 LAB — CMP14+EGFR
A/G RATIO: 2.4 (ref 1.1–2.5)
ALK PHOS: 104 IU/L (ref 39–117)
ALT: 30 IU/L (ref 0–44)
AST: 20 IU/L (ref 0–40)
Albumin: 4.7 g/dL (ref 3.5–4.8)
BUN / CREAT RATIO: 11 (ref 10–22)
BUN: 13 mg/dL (ref 8–27)
CALCIUM: 11.3 mg/dL — AB (ref 8.6–10.2)
CO2: 27 mmol/L (ref 18–29)
CREATININE: 1.19 mg/dL (ref 0.76–1.27)
Chloride: 97 mmol/L (ref 97–108)
GFR calc Af Amer: 71 mL/min/{1.73_m2} (ref >59)
GFR calc non Af Amer: 62 mL/min/{1.73_m2} (ref >59)
GLOBULIN, TOTAL: 2 g/dL (ref 1.5–4.5)
Glucose: 104 mg/dL — ABNORMAL HIGH (ref 65–99)
POTASSIUM: 4.6 mmol/L (ref 3.5–5.2)
SODIUM: 139 mmol/L (ref 134–144)
Total Bilirubin: 0.4 mg/dL (ref 0.0–1.2)
Total Protein: 6.7 g/dL (ref 6.0–8.5)

## 2013-07-23 ENCOUNTER — Other Ambulatory Visit: Payer: Self-pay | Admitting: Nurse Practitioner

## 2013-07-23 ENCOUNTER — Telehealth: Payer: Self-pay | Admitting: Nurse Practitioner

## 2013-07-23 MED ORDER — TESTOSTERONE 40.5 MG/2.5GM (1.62%) TD GEL: 1.0000 "application " | Freq: Every day | TRANSDERMAL | Status: DC

## 2013-07-23 NOTE — Telephone Encounter (Signed)
rx ready for pickup 

## 2013-07-23 NOTE — Telephone Encounter (Signed)
Patient aware.

## 2013-08-26 ENCOUNTER — Other Ambulatory Visit: Payer: Self-pay | Admitting: Family Medicine

## 2013-08-26 ENCOUNTER — Telehealth: Payer: Self-pay | Admitting: Nurse Practitioner

## 2013-08-26 MED ORDER — TESTOSTERONE 40.5 MG/2.5GM (1.62%) TD GEL: 1.0000 "application " | Freq: Every day | TRANSDERMAL | Status: DC

## 2013-08-26 NOTE — Telephone Encounter (Signed)
rx ready for pickup 

## 2013-08-27 NOTE — Telephone Encounter (Signed)
Up front patient aware 

## 2013-08-30 ENCOUNTER — Other Ambulatory Visit: Payer: Self-pay | Admitting: Family Medicine

## 2013-09-02 ENCOUNTER — Other Ambulatory Visit: Payer: Self-pay | Admitting: Family Medicine

## 2013-09-02 ENCOUNTER — Telehealth: Payer: Self-pay | Admitting: Nurse Practitioner

## 2013-09-02 NOTE — Telephone Encounter (Signed)
They will fax over RX request for provider to review

## 2013-09-05 ENCOUNTER — Other Ambulatory Visit: Payer: Self-pay | Admitting: Nurse Practitioner

## 2013-10-20 ENCOUNTER — Other Ambulatory Visit: Payer: Self-pay | Admitting: Nurse Practitioner

## 2013-10-21 NOTE — Telephone Encounter (Signed)
Last seen 03/17/13  Dr Ernestina Patches

## 2013-10-21 NOTE — Telephone Encounter (Signed)
no more refills without being seen  

## 2013-10-27 ENCOUNTER — Telehealth: Payer: Self-pay | Admitting: Family Medicine

## 2013-10-27 MED ORDER — TESTOSTERONE 40.5 MG/2.5GM (1.62%) TD GEL: 1.0000 "application " | Freq: Every day | TRANSDERMAL | Status: DC

## 2013-10-27 NOTE — Telephone Encounter (Signed)
rx ready for pickup 

## 2013-10-27 NOTE — Telephone Encounter (Signed)
Patient aware to pick up 

## 2013-11-11 ENCOUNTER — Other Ambulatory Visit: Payer: Self-pay | Admitting: Family Medicine

## 2013-11-13 ENCOUNTER — Other Ambulatory Visit: Payer: Self-pay | Admitting: Family Medicine

## 2013-11-15 ENCOUNTER — Other Ambulatory Visit: Payer: Self-pay | Admitting: Nurse Practitioner

## 2013-12-04 ENCOUNTER — Other Ambulatory Visit: Payer: Self-pay | Admitting: Family Medicine

## 2013-12-11 ENCOUNTER — Other Ambulatory Visit: Payer: Self-pay | Admitting: Nurse Practitioner

## 2013-12-24 ENCOUNTER — Ambulatory Visit (INDEPENDENT_AMBULATORY_CARE_PROVIDER_SITE_OTHER): Payer: Medicare HMO | Admitting: Family Medicine

## 2013-12-24 ENCOUNTER — Encounter: Payer: Self-pay | Admitting: Family Medicine

## 2013-12-24 VITALS — BP 155/88 | HR 96 | Temp 97.6°F | Ht 69.0 in | Wt 172.0 lb

## 2013-12-24 DIAGNOSIS — J0101 Acute recurrent maxillary sinusitis: Secondary | ICD-10-CM

## 2013-12-24 MED ORDER — AMOXICILLIN 875 MG PO TABS: 875.0000 mg | ORAL_TABLET | Freq: Two times a day (BID) | ORAL | Status: DC

## 2013-12-24 NOTE — Progress Notes (Signed)
   Subjective:    Patient ID: Rodney Hernandez, male    DOB: 12/16/1945, 68 y.o.   MRN: [id number]  HPI  68 year old gentleman with several day history of head congestion and cough. He has a history of bronchitis. He was a former smoker. He coughs up green sputum. He has had some fever and chills but is afebrile today.    Review of Systems  Constitutional: Negative.   HENT: Positive for congestion.   Eyes: Negative.   Respiratory: Positive for cough. Negative for shortness of breath.   Cardiovascular: Negative.  Negative for chest pain and leg swelling.  Gastrointestinal: Negative.   Genitourinary: Negative.   Musculoskeletal: Negative.   Skin: Negative.   Neurological: Negative.   Psychiatric/Behavioral: Negative.   All other systems reviewed and are negative.      Objective:   Physical Exam  Constitutional: He appears well-developed and well-nourished.  HENT:  Sinuses are tender to percussion  Cardiovascular: Normal rate and regular rhythm.   Pulmonary/Chest: Effort normal and breath sounds normal.   BP 155/88 mmHg  Pulse 96  Temp(Src) 97.6 F (36.4 C) (Oral)  Ht 5\' 9"  (1.753 m)  Wt 172 lb (78.019 kg)  BMI 25.39 kg/m2       Assessment & Plan:  1. Acute recurrent maxillary sinusitis Since the sputum is green and he has had some fever will treat with amoxicillin 875 twice a day for 10 days. Drink plenty of liquids. Patient says he cannot take Mucinex  Wardell Honour MD

## 2014-01-04 ENCOUNTER — Other Ambulatory Visit: Payer: Self-pay | Admitting: Family Medicine

## 2014-01-06 ENCOUNTER — Other Ambulatory Visit (INDEPENDENT_AMBULATORY_CARE_PROVIDER_SITE_OTHER): Payer: Medicare HMO

## 2014-01-06 ENCOUNTER — Telehealth: Payer: Self-pay | Admitting: *Deleted

## 2014-01-06 ENCOUNTER — Telehealth: Payer: Self-pay | Admitting: Nurse Practitioner

## 2014-01-06 DIAGNOSIS — L4053 Psoriatic spondylitis: Secondary | ICD-10-CM

## 2014-01-06 MED ORDER — TESTOSTERONE 40.5 MG/2.5GM (1.62%) TD GEL: 1.0000 "application " | Freq: Every day | TRANSDERMAL | Status: DC

## 2014-01-06 NOTE — Telephone Encounter (Signed)
Aware ,script ready. 

## 2014-01-06 NOTE — Progress Notes (Signed)
LAB ONLY FOR DR. Leigh Aurora

## 2014-01-06 NOTE — Telephone Encounter (Signed)
androgel rx ready for pick up

## 2014-01-07 LAB — CBC WITH DIFFERENTIAL
BASOS: 1 %
Basophils Absolute: 0.1 10*3/uL (ref 0.0–0.2)
EOS ABS: 0.1 10*3/uL (ref 0.0–0.4)
Eos: 1 %
HEMATOCRIT: 42.6 % (ref 37.5–51.0)
Hemoglobin: 13.9 g/dL (ref 12.6–17.7)
IMMATURE GRANS (ABS): 0.2 10*3/uL — AB (ref 0.0–0.1)
Immature Granulocytes: 3 %
Lymphocytes Absolute: 1.4 10*3/uL (ref 0.7–3.1)
Lymphs: 15 %
MCH: 29.3 pg (ref 26.6–33.0)
MCHC: 32.6 g/dL (ref 31.5–35.7)
MCV: 90 fL (ref 79–97)
MONOCYTES: 11 %
Monocytes Absolute: 1 10*3/uL — ABNORMAL HIGH (ref 0.1–0.9)
Neutrophils Absolute: 6.4 10*3/uL (ref 1.4–7.0)
Neutrophils Relative %: 69 %
Platelets: 366 10*3/uL (ref 150–379)
RBC: 4.75 x10E6/uL (ref 4.14–5.80)
RDW: 15.2 % (ref 12.3–15.4)
WBC: 9.2 10*3/uL (ref 3.4–10.8)

## 2014-01-07 LAB — CMP14+EGFR
ALT: 24 IU/L (ref 0–44)
AST: 18 IU/L (ref 0–40)
Albumin/Globulin Ratio: 2 (ref 1.1–2.5)
Albumin: 4.2 g/dL (ref 3.5–4.8)
Alkaline Phosphatase: 107 IU/L (ref 39–117)
BUN / CREAT RATIO: 14 (ref 10–22)
BUN: 14 mg/dL (ref 8–27)
CALCIUM: 10.6 mg/dL — AB (ref 8.6–10.2)
CO2: 26 mmol/L (ref 18–29)
CREATININE: 1.01 mg/dL (ref 0.76–1.27)
Chloride: 98 mmol/L (ref 97–108)
GFR calc Af Amer: 87 mL/min/{1.73_m2} (ref >59)
GFR calc non Af Amer: 75 mL/min/{1.73_m2} (ref >59)
Globulin, Total: 2.1 g/dL (ref 1.5–4.5)
Glucose: 89 mg/dL (ref 65–99)
POTASSIUM: 4.2 mmol/L (ref 3.5–5.2)
Sodium: 139 mmol/L (ref 134–144)
Total Bilirubin: 0.3 mg/dL (ref 0.0–1.2)
Total Protein: 6.3 g/dL (ref 6.0–8.5)

## 2014-01-26 ENCOUNTER — Other Ambulatory Visit: Payer: Self-pay | Admitting: Family Medicine

## 2014-01-26 NOTE — Telephone Encounter (Signed)
Per protocol, next appt 03/02/14

## 2014-02-22 ENCOUNTER — Other Ambulatory Visit: Payer: Self-pay | Admitting: Family Medicine

## 2014-02-25 DIAGNOSIS — Z85828 Personal history of other malignant neoplasm of skin: Secondary | ICD-10-CM | POA: Diagnosis not present

## 2014-02-25 DIAGNOSIS — L259 Unspecified contact dermatitis, unspecified cause: Secondary | ICD-10-CM | POA: Diagnosis not present

## 2014-02-25 DIAGNOSIS — L570 Actinic keratosis: Secondary | ICD-10-CM | POA: Diagnosis not present

## 2014-03-02 ENCOUNTER — Encounter: Payer: Self-pay | Admitting: Nurse Practitioner

## 2014-03-02 ENCOUNTER — Ambulatory Visit (INDEPENDENT_AMBULATORY_CARE_PROVIDER_SITE_OTHER): Payer: Commercial Managed Care - HMO | Admitting: Nurse Practitioner

## 2014-03-02 VITALS — BP 136/84 | HR 67 | Temp 97.1°F | Ht 69.0 in | Wt 173.0 lb

## 2014-03-02 DIAGNOSIS — N400 Enlarged prostate without lower urinary tract symptoms: Secondary | ICD-10-CM

## 2014-03-02 DIAGNOSIS — J4520 Mild intermittent asthma, uncomplicated: Secondary | ICD-10-CM

## 2014-03-02 DIAGNOSIS — Z23 Encounter for immunization: Secondary | ICD-10-CM | POA: Diagnosis not present

## 2014-03-02 DIAGNOSIS — E291 Testicular hypofunction: Secondary | ICD-10-CM | POA: Diagnosis not present

## 2014-03-02 DIAGNOSIS — K219 Gastro-esophageal reflux disease without esophagitis: Secondary | ICD-10-CM

## 2014-03-02 DIAGNOSIS — I10 Essential (primary) hypertension: Secondary | ICD-10-CM

## 2014-03-02 DIAGNOSIS — S61215A Laceration without foreign body of left ring finger without damage to nail, initial encounter: Secondary | ICD-10-CM | POA: Diagnosis not present

## 2014-03-02 MED ORDER — TESTOSTERONE 40.5 MG/2.5GM (1.62%) TD GEL: 1.0000 "application " | Freq: Every day | TRANSDERMAL | Status: DC

## 2014-03-02 MED ORDER — ALBUTEROL SULFATE HFA 108 (90 BASE) MCG/ACT IN AERS: 2.0000 | INHALATION_SPRAY | Freq: Four times a day (QID) | RESPIRATORY_TRACT | Status: DC | PRN

## 2014-03-02 MED ORDER — LOSARTAN POTASSIUM-HCTZ 100-25 MG PO TABS: 1.0000 | ORAL_TABLET | Freq: Every day | ORAL | Status: DC

## 2014-03-02 MED ORDER — OMEPRAZOLE 20 MG PO CPDR: DELAYED_RELEASE_CAPSULE | ORAL | Status: DC

## 2014-03-02 NOTE — Addendum Note (Signed)
Addended by: Earlene Plater on: 03/02/2014 02:47 PM   Modules accepted: Miquel Dunn

## 2014-03-02 NOTE — Patient Instructions (Signed)
Exercise to Stay Healthy Exercise helps you become and stay healthy. EXERCISE IDEAS AND TIPS Choose exercises that:  You enjoy.  Fit into your day. You do not need to exercise really hard to be healthy. You can do exercises at a slow or medium level and stay healthy. You can:  Stretch before and after working out.  Try yoga, Pilates, or tai chi.  Lift weights.  Walk fast, swim, jog, run, climb stairs, bicycle, dance, or rollerskate.  Take aerobic classes. Exercises that burn about 150 calories:  Running 1  miles in 15 minutes.  Playing volleyball for 45 to 60 minutes.  Washing and waxing a car for 45 to 60 minutes.  Playing touch football for 45 minutes.  Walking 1  miles in 35 minutes.  Pushing a stroller 1  miles in 30 minutes.  Playing basketball for 30 minutes.  Raking leaves for 30 minutes.  Bicycling 5 miles in 30 minutes.  Walking 2 miles in 30 minutes.  Dancing for 30 minutes.  Shoveling snow for 15 minutes.  Swimming laps for 20 minutes.  Walking up stairs for 15 minutes.  Bicycling 4 miles in 15 minutes.  Gardening for 30 to 45 minutes.  Jumping rope for 15 minutes.  Washing windows or floors for 45 to 60 minutes. Document Released: 01/28/2010 Document Revised: 03/20/2011 Document Reviewed: 01/28/2010 ExitCare Patient Information 2015 [employer]. This information is not intended to replace advice given to you by your health care provider. Make sure you discuss any questions you have with your health care provider.  

## 2014-03-02 NOTE — Progress Notes (Addendum)
Subjective:    Patient ID: Rodney Hernandez, male    DOB: November 01, 1945, 69 y.o.   MRN: [id number]   Patien there today for follow up- he had gastritis at last visit but is much better. No complaints today. Patient cut his left ring finger with a knife while cutting up an onion earlier today.  Hypertension This is a chronic problem. The current episode started more than 1 year ago. The problem is unchanged. The problem is controlled. Pertinent negatives include no headaches, neck pain or shortness of breath. Risk factors for coronary artery disease include dyslipidemia and male gender. Past treatments include angiotensin blockers and diuretics. Compliance problems include diet.   allergic rhinitis Patient used flonase in the past. They thought he was having allergic reaction to it. But come to find out it was reaction to face masks that he was wearing. Patient says that he has been sneezing a lot from working outside.  Asthma Aggravated by outside allergens. Worse since being off flonase BPH Recently had a TURP and says that he still is having problems urinating. Eczema Worsening- Wants to see dermatologist Osteo arthritis Worsening- Has pain all the time despite using ultram- Wants to see specialist Vertigo  meclizine helps but patient is out of meds GERD ompeprazole keeps symptoms under control   Review of Systems  Respiratory: Negative for shortness of breath.   Musculoskeletal: Negative for neck pain.  Neurological: Negative for headaches.  All other systems reviewed and are negative.      Objective:   Physical Exam  Constitutional: He is oriented to person, place, and time. He appears well-developed and well-nourished.  HENT:  Head: Normocephalic.  Right Ear: External ear normal.  Left Ear: External ear normal.  Nose: Nose normal.  Mouth/Throat: Oropharynx is clear and moist.  Eyes: EOM are normal. Pupils are equal, round, and reactive to light.  Neck: Normal range of motion.  Neck supple. No thyromegaly present.  Cardiovascular: Normal rate, regular rhythm, normal heart sounds and intact distal pulses.   No murmur heard. Pulmonary/Chest: Effort normal and breath sounds normal. He has no wheezes. He has no rales.  Abdominal: Soft. Bowel sounds are normal.  Genitourinary:  Hx prostatectomy   Musculoskeletal: Normal range of motion.  Neurological: He is alert and oriented to person, place, and time.  Skin: Skin is warm and dry.  Superficial laceration to left ring finger.  Psychiatric: He has a normal mood and affect. His behavior is normal. Judgment and thought content normal.    BP 136/84 mmHg  Pulse 67  Temp(Src) 97.1 F (36.2 C) (Oral)  Ht _0  (1.753 m)  Wt 173 lb (78.472 kg)  BMI 25.54 kg/m2        Assessment & Plan:   1. Essential hypertension Do not add slat to diet - losartan-hydrochlorothiazide (HYZAAR) 100-25 MG per tablet; Take 1 tablet by mouth daily.  Dispense: 30 tablet; Refill: 5 - CMP14+EGFR - NMR, lipoprofile  2. Gastroesophageal reflux disease without esophagitis Avoid spicy foods Do not eat 2 hours prior to bedtime - omeprazole (PRILOSEC) 20 MG capsule; TAKE 1 CAPSULE BY MOUTH 2 TIMES DAILY  Dispense: 60 capsule; Refill: 5  3. BPH (benign prostatic hyperplasia) Had prostatectomy  4. Asthma, chronic, mild intermittent, uncomplicated Avoid allergens - albuterol (PROVENTIL HFA;VENTOLIN HFA) 108 (90 BASE) MCG/ACT inhaler; Inhale 2 puffs into the lungs every 6 (six) hours as needed for wheezing or shortness of breath.  Dispense: 1 Inhaler; Refill: 1  5. Hypogonadism in male -  Testosterone (ANDROGEL) 40.5 MG/2.5GM (1.62%) GEL; Apply 1 application topically daily.  Dispense: 2.5 g; Refill: 5  6. Laceration left ring finger - TD  prevnar 13 today Td today Labs pending Health maintenance reviewed Diet and exercise encouraged Continue all meds Follow up  In 6 months   [place], FNP

## 2014-03-02 NOTE — Addendum Note (Signed)
Addended by: Rolena Infante on: 03/02/2014 03:16 PM   Modules accepted: Orders

## 2014-03-03 LAB — NMR, LIPOPROFILE
Cholesterol: 191 mg/dL (ref 100–199)
HDL CHOLESTEROL BY NMR: 54 mg/dL (ref >39)
HDL Particle Number: 39 umol/L (ref >=30.5)
LDL Particle Number: 1466 nmol/L — ABNORMAL HIGH (ref <1000)
LDL Size: 21 nm (ref >20.5)
LDL-C: 117 mg/dL — ABNORMAL HIGH (ref 0–99)
LP-IR SCORE: 50 — AB (ref <=45)
Small LDL Particle Number: 484 nmol/L (ref <=527)
Triglycerides by NMR: 101 mg/dL (ref 0–149)

## 2014-03-03 LAB — CMP14+EGFR
ALK PHOS: 93 IU/L (ref 39–117)
ALT: 34 IU/L (ref 0–44)
AST: 27 IU/L (ref 0–40)
Albumin/Globulin Ratio: 1.9 (ref 1.1–2.5)
Albumin: 4.5 g/dL (ref 3.5–4.8)
BUN/Creatinine Ratio: 14 (ref 10–22)
BUN: 18 mg/dL (ref 8–27)
Bilirubin Total: 0.4 mg/dL (ref 0.0–1.2)
CALCIUM: 10.8 mg/dL — AB (ref 8.6–10.2)
CO2: 28 mmol/L (ref 18–29)
Chloride: 96 mmol/L — ABNORMAL LOW (ref 97–108)
Creatinine, Ser: 1.28 mg/dL — ABNORMAL HIGH (ref 0.76–1.27)
GFR calc Af Amer: 65 mL/min/{1.73_m2} (ref >59)
GFR calc non Af Amer: 56 mL/min/{1.73_m2} — ABNORMAL LOW (ref >59)
GLOBULIN, TOTAL: 2.4 g/dL (ref 1.5–4.5)
GLUCOSE: 90 mg/dL (ref 65–99)
POTASSIUM: 4.5 mmol/L (ref 3.5–5.2)
SODIUM: 138 mmol/L (ref 134–144)
Total Protein: 6.9 g/dL (ref 6.0–8.5)

## 2014-03-06 ENCOUNTER — Telehealth: Payer: Self-pay | Admitting: Nurse Practitioner

## 2014-03-06 MED ORDER — ATORVASTATIN CALCIUM 40 MG PO TABS: 40.0000 mg | ORAL_TABLET | Freq: Every day | ORAL | Status: DC

## 2014-03-06 NOTE — Telephone Encounter (Signed)
lipitor rx sent to pharmacy 

## 2014-03-11 IMAGING — CT CT MAXILLOFACIAL W/ CM
3 of 5 series · 16 of 47 positions shown, 19 images · IV contrast (Omnipaque 300)
Comparison: None.

CLINICAL DATA: Facial cellulitis

CT MAXILLOFACIAL WITH CONTRAST
TECHNIQUE: Multidetector CT imaging of the maxillofacial
structures was performed with intravenous contrast. Multiplanar CT
image reconstructions were also generated.
Contrast: 80mL OMNIPAQUE IOHEXOL 300 MG/ML  SOLN

[Series 2: max st axial · axial · 0.37mm/px · z∈[-138,+14]mm · 11 of 90 slices shown, 14 images]
[im 7/90  brain]
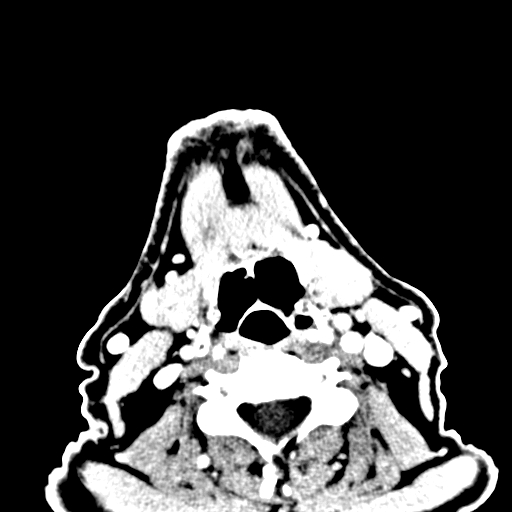
[im 7/90  bone]
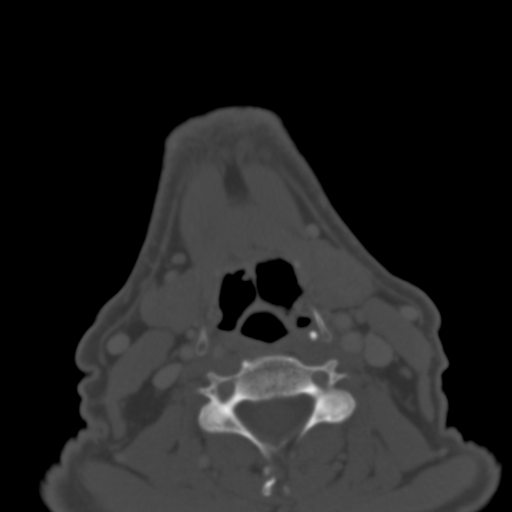
[im 13/90  bone]
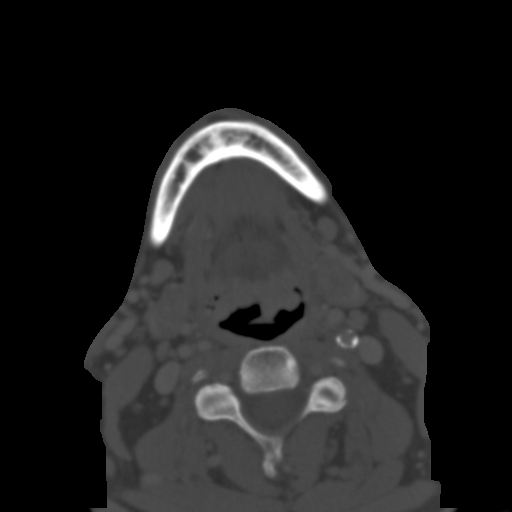
[im 22/90  bone]
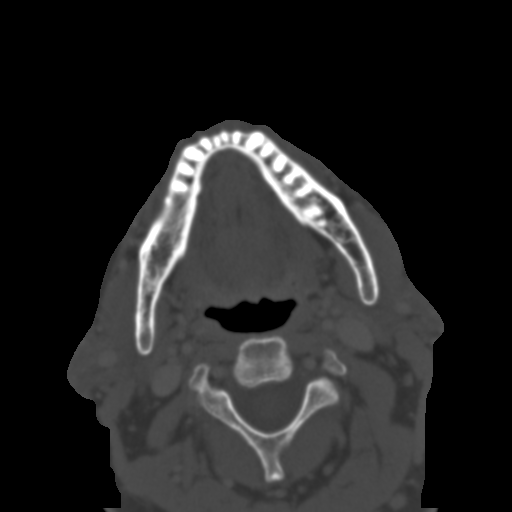
[im 28/90  bone]
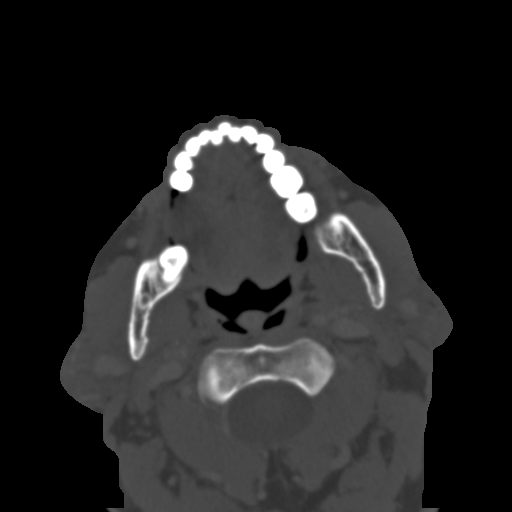
[im 37/90  brain]
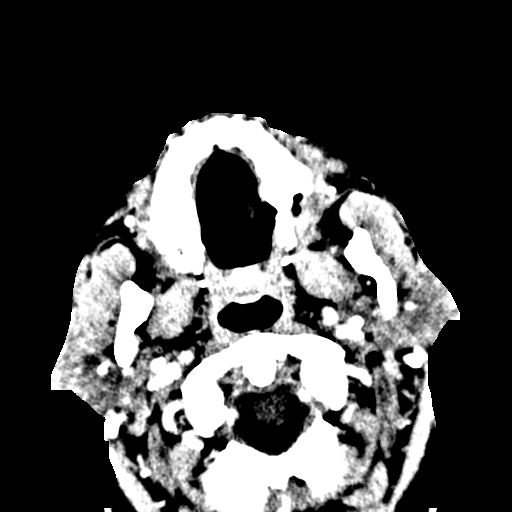
[im 37/90  bone]
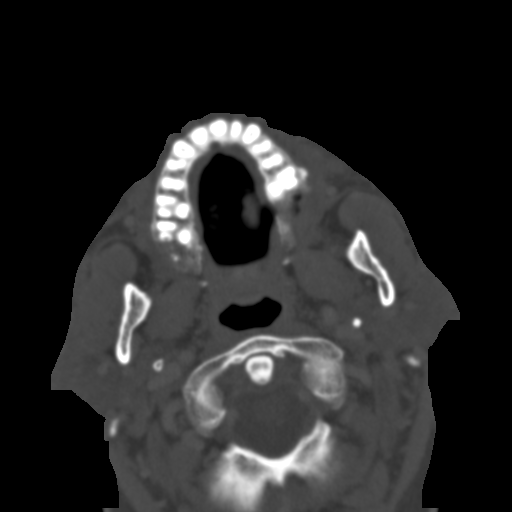
[im 47/90  bone]
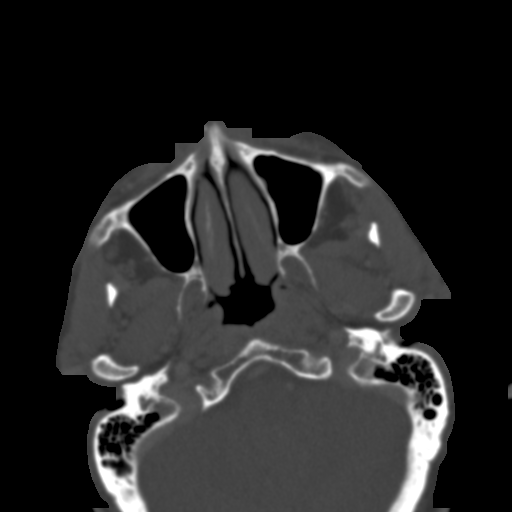
[im 53/90  bone]
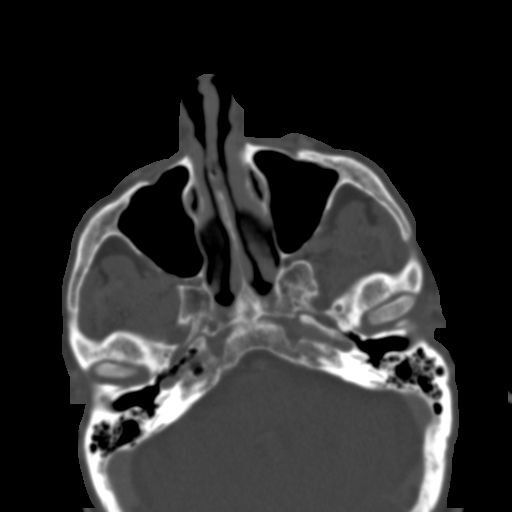
[im 62/90  bone]
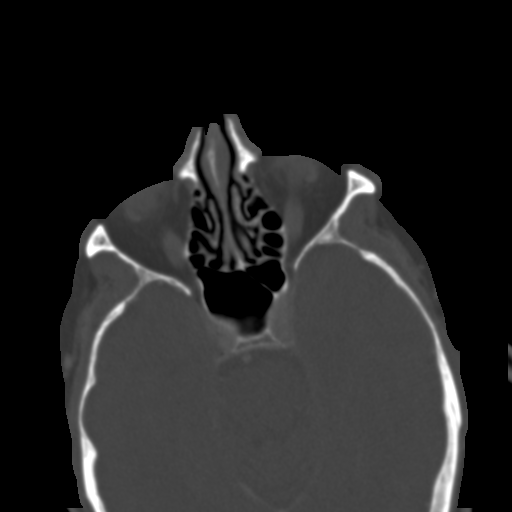
[im 68/90  brain]
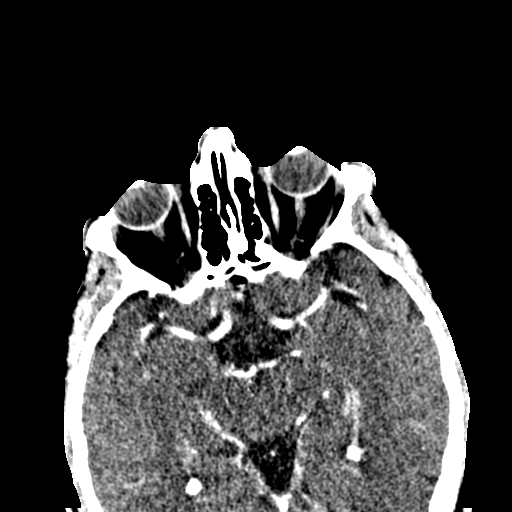
[im 68/90  bone]
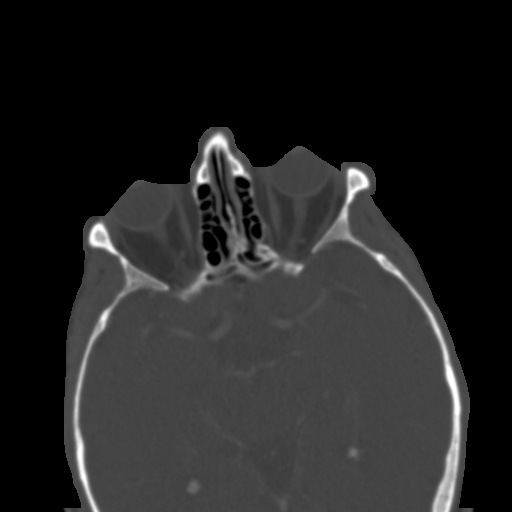
[im 77/90  bone]
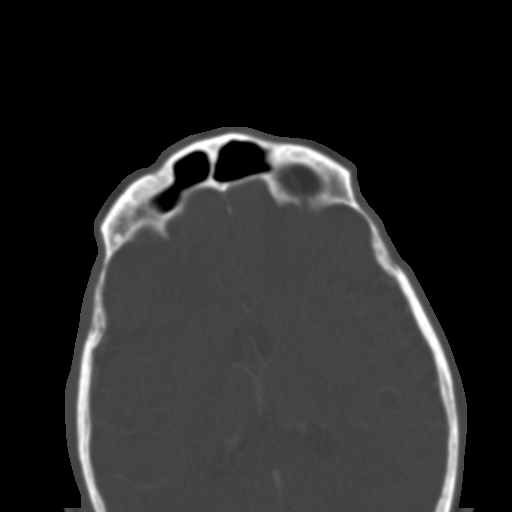
[im 83/90  bone]
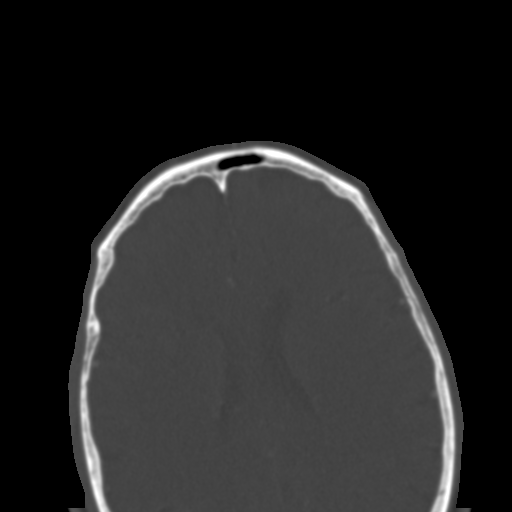

[Series 6: max bone coro · coronal · 0.36mm/px · 3 of 127 slices shown]
[im 26/127  bone]
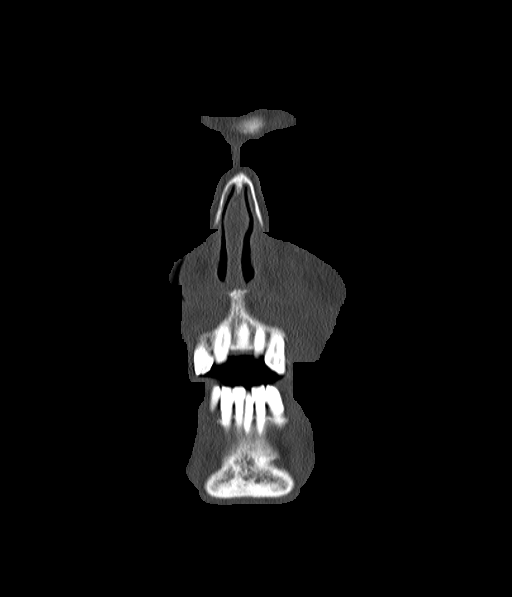
[im 51/127  bone]
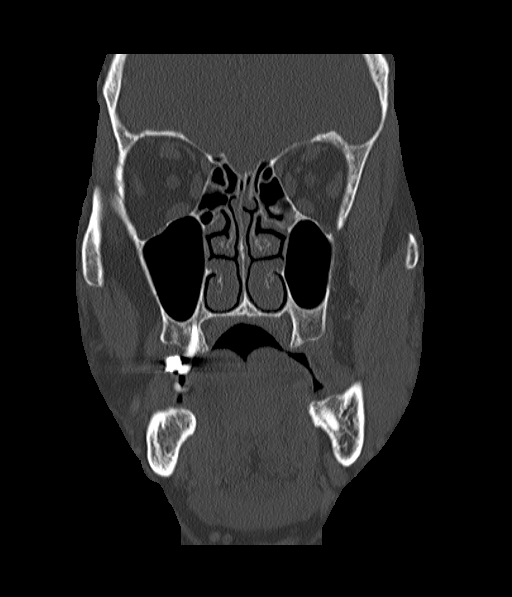
[im 76/127  bone]
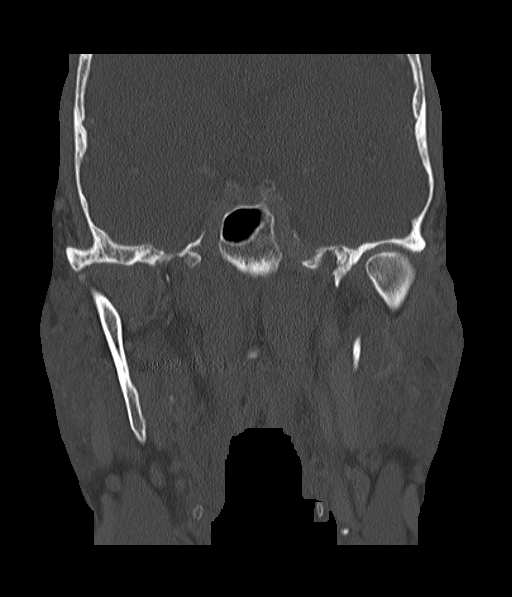

[Series 7: max bone sag · sagittal · 0.37mm/px · 2 of 127 slices shown]
[im 43/127  bone]
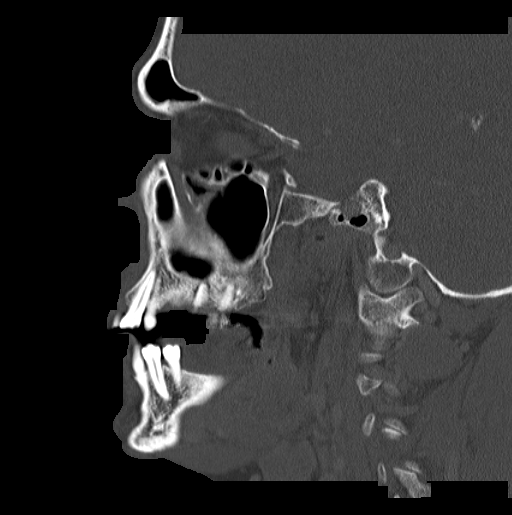
[im 85/127  bone]
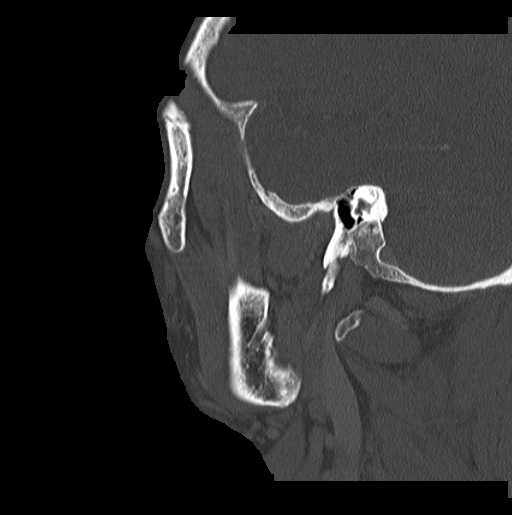

[16 of 47 positions shown; findings below may reference images not displayed]

FINDINGS: Question mild soft tissue swelling of the chin.  The soft
tissues overlying the remainder of the face are symmetric.  No soft
tissue abscess or mass is identified.

Submandibular and parotid glands are normal bilaterally.  No
pathologic adenopathy.  Sub centimeter submandibular nodes are
present bilaterally.

Negative for fracture.  No bony mass.  No periapical dental
abscess.

Mild sinusitis with mucosal thickening in the frontal and ethmoid
and maxillary sinuses bilaterally.  No air-fluid level.  Mastoid
sinus is clear bilaterally.
IMPRESSION: Negative for soft tissue abscess.

Negative for mass lesion.

Mild chronic sinusitis.

## 2014-03-23 ENCOUNTER — Other Ambulatory Visit: Payer: Self-pay | Admitting: Nurse Practitioner

## 2014-04-01 ENCOUNTER — Telehealth: Payer: Self-pay | Admitting: Nurse Practitioner

## 2014-04-01 ENCOUNTER — Other Ambulatory Visit: Payer: Self-pay | Admitting: *Deleted

## 2014-04-01 MED ORDER — SUCRALFATE 1 G PO TABS: ORAL_TABLET | ORAL | Status: DC

## 2014-04-02 ENCOUNTER — Telehealth: Payer: Self-pay | Admitting: Nurse Practitioner

## 2014-04-02 DIAGNOSIS — M5137 Other intervertebral disc degeneration, lumbosacral region: Secondary | ICD-10-CM | POA: Diagnosis not present

## 2014-04-02 DIAGNOSIS — M179 Osteoarthritis of knee, unspecified: Secondary | ICD-10-CM | POA: Diagnosis not present

## 2014-04-02 DIAGNOSIS — L4050 Arthropathic psoriasis, unspecified: Secondary | ICD-10-CM | POA: Diagnosis not present

## 2014-04-02 DIAGNOSIS — M25569 Pain in unspecified knee: Secondary | ICD-10-CM | POA: Diagnosis not present

## 2014-04-02 NOTE — Telephone Encounter (Signed)
Patient is not getting any better. The meclizine is not helping. He is still having real bad dizzy spells.

## 2014-04-06 ENCOUNTER — Other Ambulatory Visit: Payer: Self-pay

## 2014-04-06 DIAGNOSIS — H8110 Benign paroxysmal vertigo, unspecified ear: Secondary | ICD-10-CM

## 2014-04-06 NOTE — Telephone Encounter (Signed)
PER PATIENT, NO LONGER NTBS. HAS REFERRAL

## 2014-04-06 NOTE — Telephone Encounter (Signed)
This patient needs to make an appointment and come back in the rechecked.

## 2014-04-06 NOTE — Telephone Encounter (Signed)
Lmtcb, ntbs per Dr. Laurance Flatten

## 2014-04-13 DIAGNOSIS — H905 Unspecified sensorineural hearing loss: Secondary | ICD-10-CM | POA: Diagnosis not present

## 2014-04-13 DIAGNOSIS — H833X3 Noise effects on inner ear, bilateral: Secondary | ICD-10-CM | POA: Diagnosis not present

## 2014-04-13 DIAGNOSIS — H8113 Benign paroxysmal vertigo, bilateral: Secondary | ICD-10-CM | POA: Diagnosis not present

## 2014-04-28 DIAGNOSIS — H5210 Myopia, unspecified eye: Secondary | ICD-10-CM | POA: Diagnosis not present

## 2014-04-28 DIAGNOSIS — H524 Presbyopia: Secondary | ICD-10-CM | POA: Diagnosis not present

## 2014-04-28 DIAGNOSIS — D3132 Benign neoplasm of left choroid: Secondary | ICD-10-CM | POA: Diagnosis not present

## 2014-04-28 DIAGNOSIS — H52223 Regular astigmatism, bilateral: Secondary | ICD-10-CM | POA: Diagnosis not present

## 2014-04-28 DIAGNOSIS — H2510 Age-related nuclear cataract, unspecified eye: Secondary | ICD-10-CM | POA: Diagnosis not present

## 2014-04-28 DIAGNOSIS — I10 Essential (primary) hypertension: Secondary | ICD-10-CM | POA: Diagnosis not present

## 2014-06-01 ENCOUNTER — Telehealth: Payer: Self-pay | Admitting: Nurse Practitioner

## 2014-06-01 NOTE — Telephone Encounter (Signed)
NTBS.

## 2014-06-01 NOTE — Telephone Encounter (Signed)
Do you want to see patient or do you want him to just do labs

## 2014-06-02 NOTE — Telephone Encounter (Signed)
Appointment given for 6/2 with Ronnald Collum, FNP.

## 2014-06-11 ENCOUNTER — Ambulatory Visit (INDEPENDENT_AMBULATORY_CARE_PROVIDER_SITE_OTHER): Payer: Commercial Managed Care - HMO | Admitting: Nurse Practitioner

## 2014-06-11 ENCOUNTER — Encounter: Payer: Self-pay | Admitting: Nurse Practitioner

## 2014-06-11 VITALS — BP 123/71 | HR 78 | Temp 97.0°F | Ht 69.0 in | Wt 171.0 lb

## 2014-06-11 DIAGNOSIS — K219 Gastro-esophageal reflux disease without esophagitis: Secondary | ICD-10-CM

## 2014-06-11 DIAGNOSIS — I10 Essential (primary) hypertension: Secondary | ICD-10-CM | POA: Diagnosis not present

## 2014-06-11 DIAGNOSIS — E785 Hyperlipidemia, unspecified: Secondary | ICD-10-CM

## 2014-06-11 DIAGNOSIS — J4520 Mild intermittent asthma, uncomplicated: Secondary | ICD-10-CM

## 2014-06-11 DIAGNOSIS — N400 Enlarged prostate without lower urinary tract symptoms: Secondary | ICD-10-CM

## 2014-06-11 DIAGNOSIS — E291 Testicular hypofunction: Secondary | ICD-10-CM | POA: Insufficient documentation

## 2014-06-11 DIAGNOSIS — L309 Dermatitis, unspecified: Secondary | ICD-10-CM

## 2014-06-11 MED ORDER — ATORVASTATIN CALCIUM 40 MG PO TABS: 40.0000 mg | ORAL_TABLET | Freq: Every day | ORAL | Status: DC

## 2014-06-11 MED ORDER — LOSARTAN POTASSIUM-HCTZ 100-25 MG PO TABS: 1.0000 | ORAL_TABLET | Freq: Every day | ORAL | Status: DC

## 2014-06-11 MED ORDER — OMEPRAZOLE 20 MG PO CPDR: DELAYED_RELEASE_CAPSULE | ORAL | Status: DC

## 2014-06-11 MED ORDER — TESTOSTERONE 40.5 MG/2.5GM (1.62%) TD GEL: 1.0000 "application " | Freq: Every day | TRANSDERMAL | Status: DC

## 2014-06-11 NOTE — Patient Instructions (Signed)
Exercise to Stay Healthy Exercise helps you become and stay healthy. EXERCISE IDEAS AND TIPS Choose exercises that:  You enjoy.  Fit into your day. You do not need to exercise really hard to be healthy. You can do exercises at a slow or medium level and stay healthy. You can:  Stretch before and after working out.  Try yoga, Pilates, or tai chi.  Lift weights.  Walk fast, swim, jog, run, climb stairs, bicycle, dance, or rollerskate.  Take aerobic classes. Exercises that burn about 150 calories:  Running 1  miles in 15 minutes.  Playing volleyball for 45 to 60 minutes.  Washing and waxing a car for 45 to 60 minutes.  Playing touch football for 45 minutes.  Walking 1  miles in 35 minutes.  Pushing a stroller 1  miles in 30 minutes.  Playing basketball for 30 minutes.  Raking leaves for 30 minutes.  Bicycling 5 miles in 30 minutes.  Walking 2 miles in 30 minutes.  Dancing for 30 minutes.  Shoveling snow for 15 minutes.  Swimming laps for 20 minutes.  Walking up stairs for 15 minutes.  Bicycling 4 miles in 15 minutes.  Gardening for 30 to 45 minutes.  Jumping rope for 15 minutes.  Washing windows or floors for 45 to 60 minutes. Document Released: 01/28/2010 Document Revised: 03/20/2011 Document Reviewed: 01/28/2010 ExitCare Patient Information 2015 [employer]. This information is not intended to replace advice given to you by your health care provider. Make sure you discuss any questions you have with your health care provider.  

## 2014-06-11 NOTE — Progress Notes (Signed)
Subjective:    Patient ID: Rodney Hernandez, male    DOB: Mar 03, 1945, 69 y.o.   MRN: [id number]   Patien there today for follow up- he had gastritis at last visit but is much better. No complaints today.  Hypertension This is a chronic problem. The current episode started more than 1 year ago. The problem is unchanged. The problem is controlled. Pertinent negatives include no headaches, neck pain or shortness of breath. Risk factors for coronary artery disease include dyslipidemia and male gender. Past treatments include angiotensin blockers and diuretics. Compliance problems include diet.   Hyperlipidemia This is a chronic problem. The current episode started more than 1 year ago. Recent lipid tests were reviewed and are variable. Pertinent negatives include no shortness of breath. Current antihyperlipidemic treatment includes statins. The current treatment provides moderate improvement of lipids. Compliance problems include adherence to diet.  Risk factors for coronary artery disease include dyslipidemia, hypertension and male sex.  allergic rhinitis Patient used flonase in the past. They thought he was having allergic reaction to it. But come to find out it was reaction to face masks that he was wearing. Patient says that he has been sneezing a lot from working outside.  Asthma Aggravated by outside allergens. Worse since being off flonase BPH Recently had a TURP and says that he still is having problems urinating. Eczema Worsening- Wants to see dermatologist Osteo arthritis Worsening- Has pain all the time despite using ultram- Wants to see specialist Vertigo  meclizine helps but patient is out of meds GERD ompeprazole keeps symptoms under control   Review of Systems  Constitutional: Negative.   HENT: Negative.   Respiratory: Negative for shortness of breath.   Genitourinary: Negative.        Denies urinary incontinence  Musculoskeletal: Negative for neck pain.  Neurological: Negative  for headaches.  Psychiatric/Behavioral: Negative.   All other systems reviewed and are negative.      Objective:   Physical Exam  Constitutional: He is oriented to person, place, and time. He appears well-developed and well-nourished.  HENT:  Head: Normocephalic.  Right Ear: External ear normal.  Left Ear: External ear normal.  Nose: Nose normal.  Mouth/Throat: Oropharynx is clear and moist.  Eyes: EOM are normal. Pupils are equal, round, and reactive to light.  Neck: Normal range of motion. Neck supple. No thyromegaly present.  Cardiovascular: Normal rate, regular rhythm, normal heart sounds and intact distal pulses.   No murmur heard. Pulmonary/Chest: Effort normal and breath sounds normal. He has no wheezes. He has no rales.  Abdominal: Soft. Bowel sounds are normal.  Genitourinary:  Hx prostatectomy   Musculoskeletal: Normal range of motion.  Neurological: He is alert and oriented to person, place, and time.  Skin: Skin is warm and dry.  Psychiatric: He has a normal mood and affect. His behavior is normal. Judgment and thought content normal.    BP 123/71 mmHg  Pulse 78  Temp(Src) 97 F (36.1 C) (Oral)  Ht '5\' 9"'  (1.753 m)  Wt 171 lb (77.565 kg)  BMI 25.24 kg/m2        Assessment & Plan:    1. Essential hypertension Do not add slat to diet - losartan-hydrochlorothiazide (HYZAAR) 100-25 MG per tablet; Take 1 tablet by mouth daily.  Dispense: 30 tablet; Refill: 5 - CMP14+EGFR  2. Asthma, chronic, mild intermittent, uncomplicated  3. Gastroesophageal reflux disease without esophagitis .Avoid spicy foods Do not eat 2 hours prior to bedtime - omeprazole (PRILOSEC) 20 MG capsule; TAKE  1 CAPSULE BY MOUTH 2 TIMES DAILY  Dispense: 60 capsule; Refill: 5  4. BPH (benign prostatic hyperplasia) - PSA Total+%Free (Serial)  5. Eczema  6. Hypogonadism in male - Testosterone (ANDROGEL) 40.5 MG/2.5GM (1.62%) GEL; Apply 1 application topically daily.  Dispense: 2.5 g;  Refill: 5  7. Hyperlipidemia with target LDL less than 100 Low fat diet - atorvastatin (LIPITOR) 40 MG tablet; Take 1 tablet (40 mg total) by mouth daily.  Dispense: 90 tablet; Refill: 1 - NMR, lipoprofile    Labs pending Health maintenance reviewed Diet and exercise encouraged Continue all meds Follow up  In 6 months   [place], FNP

## 2014-06-12 ENCOUNTER — Telehealth: Payer: Self-pay | Admitting: *Deleted

## 2014-06-12 DIAGNOSIS — R972 Elevated prostate specific antigen [PSA]: Secondary | ICD-10-CM

## 2014-06-12 LAB — CMP14+EGFR
ALBUMIN: 4.5 g/dL (ref 3.5–4.8)
ALK PHOS: 98 IU/L (ref 39–117)
ALT: 25 IU/L (ref 0–44)
AST: 18 IU/L (ref 0–40)
Albumin/Globulin Ratio: 2.3 (ref 1.1–2.5)
BUN/Creatinine Ratio: 10 (ref 10–22)
BUN: 11 mg/dL (ref 8–27)
Bilirubin Total: 1 mg/dL (ref 0.0–1.2)
CO2: 23 mmol/L (ref 18–29)
CREATININE: 1.07 mg/dL (ref 0.76–1.27)
Calcium: 10.4 mg/dL — ABNORMAL HIGH (ref 8.6–10.2)
Chloride: 98 mmol/L (ref 97–108)
GFR, EST AFRICAN AMERICAN: 80 mL/min/{1.73_m2} (ref >59)
GFR, EST NON AFRICAN AMERICAN: 69 mL/min/{1.73_m2} (ref >59)
GLUCOSE: 94 mg/dL (ref 65–99)
Globulin, Total: 2 g/dL (ref 1.5–4.5)
Potassium: 4.3 mmol/L (ref 3.5–5.2)
Sodium: 139 mmol/L (ref 134–144)
TOTAL PROTEIN: 6.5 g/dL (ref 6.0–8.5)

## 2014-06-12 LAB — NMR, LIPOPROFILE
Cholesterol: 99 mg/dL — ABNORMAL LOW (ref 100–199)
HDL Cholesterol by NMR: 39 mg/dL — ABNORMAL LOW (ref >39)
HDL Particle Number: 28.8 umol/L — ABNORMAL LOW (ref >=30.5)
LDL PARTICLE NUMBER: 559 nmol/L (ref <1000)
LDL Size: 20.4 nm (ref >20.5)
LDL-C: 45 mg/dL (ref 0–99)
LP-IR Score: 65 — ABNORMAL HIGH (ref <=45)
Small LDL Particle Number: 265 nmol/L (ref <=527)
TRIGLYCERIDES BY NMR: 76 mg/dL (ref 0–149)

## 2014-06-12 LAB — PSA TOTAL+% FREE (SERIAL)
PSA FREE PCT: 21.7 %
PSA, Free: 1.02 ng/mL
Prostate Specific Ag, Serum: 4.7 ng/mL — ABNORMAL HIGH (ref 0.0–4.0)

## 2014-06-12 NOTE — Telephone Encounter (Signed)
-----   Message from Chevis Pretty, [place] sent at 06/12/2014  3:25 PM EDT ----- Kidney and liver function stable Cholesterol looks great psa elevated- needs to see urologist- does he have one?

## 2014-06-12 NOTE — Telephone Encounter (Signed)
Pt notified of results Verbalizes understanding Referral entered for urology

## 2014-06-22 DIAGNOSIS — R972 Elevated prostate specific antigen [PSA]: Secondary | ICD-10-CM | POA: Diagnosis not present

## 2014-06-22 DIAGNOSIS — E291 Testicular hypofunction: Secondary | ICD-10-CM | POA: Diagnosis not present

## 2014-07-03 ENCOUNTER — Other Ambulatory Visit (INDEPENDENT_AMBULATORY_CARE_PROVIDER_SITE_OTHER): Payer: Commercial Managed Care - HMO

## 2014-07-03 DIAGNOSIS — L4050 Arthropathic psoriasis, unspecified: Secondary | ICD-10-CM

## 2014-07-03 NOTE — Progress Notes (Signed)
Lab work for Dr Leigh Aurora CMP,CBC DX: L40.50

## 2014-07-04 LAB — CMP14+EGFR
ALBUMIN: 4.4 g/dL (ref 3.5–4.8)
ALK PHOS: 102 IU/L (ref 39–117)
ALT: 45 IU/L — ABNORMAL HIGH (ref 0–44)
AST: 24 IU/L (ref 0–40)
Albumin/Globulin Ratio: 2.1 (ref 1.1–2.5)
BILIRUBIN TOTAL: 0.5 mg/dL (ref 0.0–1.2)
BUN/Creatinine Ratio: 10 (ref 10–22)
BUN: 11 mg/dL (ref 8–27)
CO2: 27 mmol/L (ref 18–29)
Calcium: 10.8 mg/dL — ABNORMAL HIGH (ref 8.6–10.2)
Chloride: 98 mmol/L (ref 97–108)
Creatinine, Ser: 1.07 mg/dL (ref 0.76–1.27)
GFR calc Af Amer: 80 mL/min/{1.73_m2} (ref >59)
GFR calc non Af Amer: 69 mL/min/{1.73_m2} (ref >59)
GLOBULIN, TOTAL: 2.1 g/dL (ref 1.5–4.5)
Glucose: 90 mg/dL (ref 65–99)
Potassium: 4.1 mmol/L (ref 3.5–5.2)
SODIUM: 140 mmol/L (ref 134–144)
Total Protein: 6.5 g/dL (ref 6.0–8.5)

## 2014-07-04 LAB — CBC WITH DIFFERENTIAL/PLATELET
Basophils Absolute: 0 10*3/uL (ref 0.0–0.2)
Basos: 1 %
EOS (ABSOLUTE): 0.1 10*3/uL (ref 0.0–0.4)
EOS: 1 %
Hematocrit: 43.4 % (ref 37.5–51.0)
Hemoglobin: 14.6 g/dL (ref 12.6–17.7)
IMMATURE GRANS (ABS): 0 10*3/uL (ref 0.0–0.1)
Immature Granulocytes: 0 %
LYMPHS: 18 %
Lymphocytes Absolute: 1.5 10*3/uL (ref 0.7–3.1)
MCH: 29.4 pg (ref 26.6–33.0)
MCHC: 33.6 g/dL (ref 31.5–35.7)
MCV: 87 fL (ref 79–97)
MONOS ABS: 0.8 10*3/uL (ref 0.1–0.9)
Monocytes: 10 %
NEUTROS PCT: 70 %
Neutrophils Absolute: 5.7 10*3/uL (ref 1.4–7.0)
Platelets: 360 10*3/uL (ref 150–379)
RBC: 4.97 x10E6/uL (ref 4.14–5.80)
RDW: 14.7 % (ref 12.3–15.4)
WBC: 8.1 10*3/uL (ref 3.4–10.8)

## 2014-07-22 DIAGNOSIS — Z86010 Personal history of colonic polyps: Secondary | ICD-10-CM | POA: Diagnosis not present

## 2014-07-22 DIAGNOSIS — K219 Gastro-esophageal reflux disease without esophagitis: Secondary | ICD-10-CM | POA: Diagnosis not present

## 2014-07-27 DIAGNOSIS — J449 Chronic obstructive pulmonary disease, unspecified: Secondary | ICD-10-CM | POA: Diagnosis not present

## 2014-07-27 DIAGNOSIS — K2950 Unspecified chronic gastritis without bleeding: Secondary | ICD-10-CM | POA: Diagnosis not present

## 2014-07-27 DIAGNOSIS — Z86010 Personal history of colonic polyps: Secondary | ICD-10-CM | POA: Diagnosis not present

## 2014-07-27 DIAGNOSIS — Z1211 Encounter for screening for malignant neoplasm of colon: Secondary | ICD-10-CM | POA: Diagnosis not present

## 2014-07-27 DIAGNOSIS — D122 Benign neoplasm of ascending colon: Secondary | ICD-10-CM | POA: Diagnosis not present

## 2014-07-27 DIAGNOSIS — D126 Benign neoplasm of colon, unspecified: Secondary | ICD-10-CM | POA: Diagnosis not present

## 2014-07-27 DIAGNOSIS — K219 Gastro-esophageal reflux disease without esophagitis: Secondary | ICD-10-CM | POA: Diagnosis not present

## 2014-07-27 LAB — HM COLONOSCOPY

## 2014-08-19 ENCOUNTER — Other Ambulatory Visit: Payer: Self-pay | Admitting: Nurse Practitioner

## 2014-08-26 DIAGNOSIS — L570 Actinic keratosis: Secondary | ICD-10-CM | POA: Diagnosis not present

## 2014-08-26 DIAGNOSIS — L259 Unspecified contact dermatitis, unspecified cause: Secondary | ICD-10-CM | POA: Diagnosis not present

## 2014-08-26 DIAGNOSIS — Z85828 Personal history of other malignant neoplasm of skin: Secondary | ICD-10-CM | POA: Diagnosis not present

## 2014-08-28 ENCOUNTER — Other Ambulatory Visit: Payer: Self-pay | Admitting: Nurse Practitioner

## 2014-10-05 DIAGNOSIS — M5137 Other intervertebral disc degeneration, lumbosacral region: Secondary | ICD-10-CM | POA: Diagnosis not present

## 2014-10-05 DIAGNOSIS — M170 Bilateral primary osteoarthritis of knee: Secondary | ICD-10-CM | POA: Diagnosis not present

## 2014-10-05 DIAGNOSIS — M62838 Other muscle spasm: Secondary | ICD-10-CM | POA: Diagnosis not present

## 2014-10-05 DIAGNOSIS — L4050 Arthropathic psoriasis, unspecified: Secondary | ICD-10-CM | POA: Diagnosis not present

## 2014-10-05 DIAGNOSIS — M25569 Pain in unspecified knee: Secondary | ICD-10-CM | POA: Diagnosis not present

## 2014-10-14 ENCOUNTER — Ambulatory Visit (INDEPENDENT_AMBULATORY_CARE_PROVIDER_SITE_OTHER): Payer: Commercial Managed Care - HMO

## 2014-10-14 DIAGNOSIS — Z23 Encounter for immunization: Secondary | ICD-10-CM

## 2014-11-23 ENCOUNTER — Other Ambulatory Visit: Payer: Self-pay | Admitting: Nurse Practitioner

## 2014-12-01 DIAGNOSIS — L4059 Other psoriatic arthropathy: Secondary | ICD-10-CM | POA: Diagnosis not present

## 2014-12-01 DIAGNOSIS — M2550 Pain in unspecified joint: Secondary | ICD-10-CM | POA: Diagnosis not present

## 2014-12-22 ENCOUNTER — Other Ambulatory Visit (INDEPENDENT_AMBULATORY_CARE_PROVIDER_SITE_OTHER): Payer: Commercial Managed Care - HMO

## 2014-12-22 DIAGNOSIS — L4059 Other psoriatic arthropathy: Secondary | ICD-10-CM | POA: Diagnosis not present

## 2014-12-22 NOTE — Progress Notes (Signed)
Lab only 

## 2014-12-23 LAB — CMP14+EGFR
A/G RATIO: 2.4 (ref 1.1–2.5)
ALBUMIN: 4.6 g/dL (ref 3.5–4.8)
ALK PHOS: 105 IU/L (ref 39–117)
ALT: 34 IU/L (ref 0–44)
AST: 19 IU/L (ref 0–40)
BILIRUBIN TOTAL: 0.7 mg/dL (ref 0.0–1.2)
BUN / CREAT RATIO: 16 (ref 10–22)
BUN: 16 mg/dL (ref 8–27)
CHLORIDE: 95 mmol/L — AB (ref 96–106)
CO2: 26 mmol/L (ref 18–29)
Calcium: 10.7 mg/dL — ABNORMAL HIGH (ref 8.6–10.2)
Creatinine, Ser: 1 mg/dL (ref 0.76–1.27)
GFR calc Af Amer: 87 mL/min/{1.73_m2} (ref >59)
GFR calc non Af Amer: 75 mL/min/{1.73_m2} (ref >59)
GLOBULIN, TOTAL: 1.9 g/dL (ref 1.5–4.5)
Glucose: 111 mg/dL — ABNORMAL HIGH (ref 65–99)
POTASSIUM: 4.1 mmol/L (ref 3.5–5.2)
SODIUM: 136 mmol/L (ref 134–144)
Total Protein: 6.5 g/dL (ref 6.0–8.5)

## 2014-12-23 LAB — CBC WITH DIFFERENTIAL/PLATELET
BASOS ABS: 0.1 10*3/uL (ref 0.0–0.2)
BASOS: 1 %
EOS (ABSOLUTE): 0.2 10*3/uL (ref 0.0–0.4)
Eos: 2 %
Hematocrit: 47 % (ref 37.5–51.0)
Hemoglobin: 15.7 g/dL (ref 12.6–17.7)
Immature Grans (Abs): 0.1 10*3/uL (ref 0.0–0.1)
Immature Granulocytes: 1 %
LYMPHS: 10 %
Lymphocytes Absolute: 1.4 10*3/uL (ref 0.7–3.1)
MCH: 29.7 pg (ref 26.6–33.0)
MCHC: 33.4 g/dL (ref 31.5–35.7)
MCV: 89 fL (ref 79–97)
MONOS ABS: 1.7 10*3/uL — AB (ref 0.1–0.9)
Monocytes: 12 %
NEUTROS ABS: 10.5 10*3/uL — AB (ref 1.4–7.0)
Neutrophils: 74 %
PLATELETS: 260 10*3/uL (ref 150–379)
RBC: 5.28 x10E6/uL (ref 4.14–5.80)
RDW: 15.5 % — AB (ref 12.3–15.4)
WBC: 14 10*3/uL — ABNORMAL HIGH (ref 3.4–10.8)

## 2014-12-23 LAB — C-REACTIVE PROTEIN: CRP: 0.3 mg/L (ref 0.0–4.9)

## 2014-12-23 LAB — SEDIMENTATION RATE: SED RATE: 2 mm/h (ref 0–30)

## 2015-01-19 ENCOUNTER — Other Ambulatory Visit: Payer: Self-pay | Admitting: Nurse Practitioner

## 2015-01-19 DIAGNOSIS — E291 Testicular hypofunction: Secondary | ICD-10-CM

## 2015-01-19 MED ORDER — TESTOSTERONE 40.5 MG/2.5GM (1.62%) TD GEL: 1.0000 "application " | Freq: Every day | TRANSDERMAL | Status: DC

## 2015-01-19 NOTE — Telephone Encounter (Signed)
Patient notified that rx up front and ready for pick up 

## 2015-01-19 NOTE — Telephone Encounter (Signed)
Testosterone rx ready for pick up  

## 2015-01-23 ENCOUNTER — Other Ambulatory Visit: Payer: Self-pay | Admitting: Nurse Practitioner

## 2015-01-26 ENCOUNTER — Telehealth: Payer: Self-pay | Admitting: Nurse Practitioner

## 2015-01-26 NOTE — Telephone Encounter (Signed)
Called [facility] and verified testosterone rx.

## 2015-02-15 DIAGNOSIS — L4059 Other psoriatic arthropathy: Secondary | ICD-10-CM | POA: Diagnosis not present

## 2015-02-15 DIAGNOSIS — M170 Bilateral primary osteoarthritis of knee: Secondary | ICD-10-CM | POA: Diagnosis not present

## 2015-02-15 DIAGNOSIS — Z79899 Other long term (current) drug therapy: Secondary | ICD-10-CM | POA: Diagnosis not present

## 2015-02-15 DIAGNOSIS — M2550 Pain in unspecified joint: Secondary | ICD-10-CM | POA: Diagnosis not present

## 2015-02-15 DIAGNOSIS — M25561 Pain in right knee: Secondary | ICD-10-CM | POA: Diagnosis not present

## 2015-02-27 ENCOUNTER — Other Ambulatory Visit: Payer: Self-pay | Admitting: Nurse Practitioner

## 2015-03-01 DIAGNOSIS — Z85828 Personal history of other malignant neoplasm of skin: Secondary | ICD-10-CM | POA: Diagnosis not present

## 2015-03-01 DIAGNOSIS — L570 Actinic keratosis: Secondary | ICD-10-CM | POA: Diagnosis not present

## 2015-03-01 DIAGNOSIS — D485 Neoplasm of uncertain behavior of skin: Secondary | ICD-10-CM | POA: Diagnosis not present

## 2015-03-01 NOTE — Telephone Encounter (Signed)
Please advise on refills- omeprazole no longer on med list.

## 2015-04-08 ENCOUNTER — Other Ambulatory Visit: Payer: Self-pay | Admitting: Nurse Practitioner

## 2015-04-08 DIAGNOSIS — E291 Testicular hypofunction: Secondary | ICD-10-CM

## 2015-04-08 MED ORDER — TESTOSTERONE 40.5 MG/2.5GM (1.62%) TD GEL: 1.0000 "application " | Freq: Every day | TRANSDERMAL | Status: DC

## 2015-04-08 NOTE — Telephone Encounter (Signed)
Pt aware.

## 2015-04-08 NOTE — Telephone Encounter (Signed)
Testosterone ready for pick up

## 2015-04-15 DIAGNOSIS — M170 Bilateral primary osteoarthritis of knee: Secondary | ICD-10-CM | POA: Diagnosis not present

## 2015-04-15 DIAGNOSIS — Z79899 Other long term (current) drug therapy: Secondary | ICD-10-CM | POA: Diagnosis not present

## 2015-04-15 DIAGNOSIS — L4059 Other psoriatic arthropathy: Secondary | ICD-10-CM | POA: Diagnosis not present

## 2015-04-15 DIAGNOSIS — M25561 Pain in right knee: Secondary | ICD-10-CM | POA: Diagnosis not present

## 2015-04-15 DIAGNOSIS — M2550 Pain in unspecified joint: Secondary | ICD-10-CM | POA: Diagnosis not present

## 2015-04-25 ENCOUNTER — Other Ambulatory Visit: Payer: Self-pay | Admitting: Nurse Practitioner

## 2015-04-26 NOTE — Telephone Encounter (Signed)
Last seen 06/11/14   MMM

## 2015-04-26 NOTE — Telephone Encounter (Signed)
Last refill without being seen 

## 2015-04-26 NOTE — Telephone Encounter (Signed)
Patient has appointment 5/4

## 2015-04-29 DIAGNOSIS — Z0100 Encounter for examination of eyes and vision without abnormal findings: Secondary | ICD-10-CM | POA: Diagnosis not present

## 2015-04-29 DIAGNOSIS — I10 Essential (primary) hypertension: Secondary | ICD-10-CM | POA: Diagnosis not present

## 2015-05-12 ENCOUNTER — Encounter (INDEPENDENT_AMBULATORY_CARE_PROVIDER_SITE_OTHER): Payer: Self-pay

## 2015-05-13 ENCOUNTER — Ambulatory Visit (INDEPENDENT_AMBULATORY_CARE_PROVIDER_SITE_OTHER): Payer: Commercial Managed Care - HMO

## 2015-05-13 ENCOUNTER — Ambulatory Visit (INDEPENDENT_AMBULATORY_CARE_PROVIDER_SITE_OTHER): Payer: Commercial Managed Care - HMO | Admitting: Nurse Practitioner

## 2015-05-13 ENCOUNTER — Encounter: Payer: Self-pay | Admitting: Nurse Practitioner

## 2015-05-13 VITALS — BP 106/59 | HR 63 | Temp 97.3°F | Ht 69.0 in | Wt 165.2 lb

## 2015-05-13 DIAGNOSIS — E291 Testicular hypofunction: Secondary | ICD-10-CM

## 2015-05-13 DIAGNOSIS — J4520 Mild intermittent asthma, uncomplicated: Secondary | ICD-10-CM

## 2015-05-13 DIAGNOSIS — L409 Psoriasis, unspecified: Secondary | ICD-10-CM

## 2015-05-13 DIAGNOSIS — I10 Essential (primary) hypertension: Secondary | ICD-10-CM

## 2015-05-13 DIAGNOSIS — Z1159 Encounter for screening for other viral diseases: Secondary | ICD-10-CM | POA: Diagnosis not present

## 2015-05-13 DIAGNOSIS — K219 Gastro-esophageal reflux disease without esophagitis: Secondary | ICD-10-CM

## 2015-05-13 DIAGNOSIS — L4050 Arthropathic psoriasis, unspecified: Secondary | ICD-10-CM

## 2015-05-13 DIAGNOSIS — E1169 Type 2 diabetes mellitus with other specified complication: Secondary | ICD-10-CM | POA: Insufficient documentation

## 2015-05-13 DIAGNOSIS — Z0000 Encounter for general adult medical examination without abnormal findings: Secondary | ICD-10-CM | POA: Diagnosis not present

## 2015-05-13 DIAGNOSIS — L309 Dermatitis, unspecified: Secondary | ICD-10-CM

## 2015-05-13 DIAGNOSIS — E785 Hyperlipidemia, unspecified: Secondary | ICD-10-CM | POA: Insufficient documentation

## 2015-05-13 DIAGNOSIS — N400 Enlarged prostate without lower urinary tract symptoms: Secondary | ICD-10-CM

## 2015-05-13 MED ORDER — TESTOSTERONE 40.5 MG/2.5GM (1.62%) TD GEL: 1.0000 "application " | Freq: Every day | TRANSDERMAL | Status: DC

## 2015-05-13 MED ORDER — ALBUTEROL SULFATE HFA 108 (90 BASE) MCG/ACT IN AERS: 2.0000 | INHALATION_SPRAY | Freq: Four times a day (QID) | RESPIRATORY_TRACT | Status: DC | PRN

## 2015-05-13 MED ORDER — OMEPRAZOLE 20 MG PO CPDR: DELAYED_RELEASE_CAPSULE | ORAL | Status: DC

## 2015-05-13 MED ORDER — LOSARTAN POTASSIUM-HCTZ 100-25 MG PO TABS: 1.0000 | ORAL_TABLET | Freq: Every day | ORAL | Status: DC

## 2015-05-13 MED ORDER — ATORVASTATIN CALCIUM 40 MG PO TABS: ORAL_TABLET | ORAL | Status: DC

## 2015-05-13 NOTE — Patient Instructions (Signed)

## 2015-05-13 NOTE — Progress Notes (Signed)
Subjective:    Patient ID: Rodney Hernandez, male    DOB: Nov 02, 1945, 70 y.o.   MRN: [id number]  Patient here today for annual exam and follow up of chronic medical problems.  Outpatient Encounter Prescriptions as of 05/13/2015  Medication Sig  . albuterol (PROVENTIL HFA;VENTOLIN HFA) 108 (90 BASE) MCG/ACT inhaler Inhale 2 puffs into the lungs every 6 (six) hours as needed for wheezing or shortness of breath.  . Apremilast (OTEZLA) 30 MG TABS Take by mouth.  Marland Kitchen atorvastatin (LIPITOR) 40 MG tablet TAKE 1 TABLET (40 MG TOTAL) BY MOUTH DAILY.  . fluticasone (FLONASE) 50 MCG/ACT nasal spray Place into both nostrils daily.  . folic acid (FOLVITE) 1 MG tablet   . losartan-hydrochlorothiazide (HYZAAR) 100-25 MG tablet TAKE 1 TABLET BY MOUTH DAILY.  . methotrexate 25 MG/ML injection Inject 50 mg/m2 as directed once a week.   Marland Kitchen omeprazole (PRILOSEC) 20 MG capsule TAKE 1 CAPSULE BY MOUTH 2 TIMES DAILY  . omeprazole (PRILOSEC) 20 MG capsule TAKE 1 CAPSULE BY MOUTH 2 TIMES DAILY  . Testosterone (ANDROGEL) 40.5 MG/2.5GM (1.62%) GEL Apply 1 application topically daily.         Hypertension This is a chronic problem. The current episode started more than 1 year ago. The problem is unchanged. The problem is controlled. Pertinent negatives include no headaches, neck pain or shortness of breath. Risk factors for coronary artery disease include dyslipidemia and male gender. Past treatments include angiotensin blockers and diuretics. Compliance problems include diet.   Hyperlipidemia This is a chronic problem. The current episode started more than 1 year ago. Recent lipid tests were reviewed and are variable. Pertinent negatives include no shortness of breath. Current antihyperlipidemic treatment includes statins. The current treatment provides moderate improvement of lipids. Compliance problems include adherence to diet.  Risk factors for coronary artery disease include dyslipidemia, hypertension and male sex.   allergic rhinitis Patient used flonase in the past. They thought he was having allergic reaction to it. But come to find out it was reaction to face masks that he was wearing. Patient says that he has been sneezing a lot from working outside.  Asthma Aggravated by outside allergens. Worse since being off flonase BPH Recently had a TURP and says that he still is having problems urinating. Eczema Worsening- Wants to see dermatologist Osteo arthritis/psoriatic arthritis Has seen rheumatologist and has started on otezla and methotrexate injections Vertigo  meclizine helps but patient is out of meds GERD ompeprazole keeps symptoms under control Hypogonadism androgel daily- no c/o fatigue     Review of Systems  Constitutional: Negative.   HENT: Negative.   Respiratory: Negative for shortness of breath.   Genitourinary: Negative.        Denies urinary incontinence  Musculoskeletal: Negative for neck pain.  Neurological: Negative for headaches.  Psychiatric/Behavioral: Negative.   All other systems reviewed and are negative.      Objective:   Physical Exam  Constitutional: He is oriented to person, place, and time. He appears well-developed and well-nourished.  HENT:  Head: Normocephalic.  Right Ear: External ear normal.  Left Ear: External ear normal.  Nose: Nose normal.  Mouth/Throat: Oropharynx is clear and moist.  Eyes: EOM are normal. Pupils are equal, round, and reactive to light.  Neck: Normal range of motion. Neck supple. No thyromegaly present.  Cardiovascular: Normal rate, regular rhythm, normal heart sounds and intact distal pulses.   No murmur heard. Pulmonary/Chest: Effort normal and breath sounds normal. He has no wheezes. He has  no rales.  Abdominal: Soft. Bowel sounds are normal.  Genitourinary:  Hx prostatectomy   Musculoskeletal: Normal range of motion.  Neurological: He is alert and oriented to person, place, and time.  Skin: Skin is warm and dry.   Psychiatric: He has a normal mood and affect. His behavior is normal. Judgment and thought content normal.    BP 106/59 mmHg  Pulse 63  Temp(Src) 97.3 F (36.3 C) (Oral)  Ht '5\' 9"'  (1.753 m)  Wt 165 lb 3.2 oz (74.934 kg)  BMI 24.38 kg/m2  Chest x ray- no cardiopulmonary abnormalities-Preliminary reading by Ronnald Collum, FNP  [facility] EKG- Kerry Hough, FNP      Assessment & Plan:   1. Annual physical exam  2. Essential hypertension Do not add salt to diet - DG Chest 2 View; Future - EKG 12-Lead - losartan-hydrochlorothiazide (HYZAAR) 100-25 MG tablet; Take 1 tablet by mouth daily.  Dispense: 90 tablet; Refill: 1 - CMP14+EGFR  3. Gastroesophageal reflux disease without esophagitis Avoid spicy foods Do not eat 2 hours prior to bedtime - omeprazole (PRILOSEC) 20 MG capsule; TAKE 1 CAPSULE BY MOUTH 2 TIMES DAILY  Dispense: 180 capsule; Refill: 1  4. BPH (benign prostatic hyperplasia) Continue  To see urologist   5. Eczema/psoriasis  6. Hypogonadism in male - Testosterone (ANDROGEL) 40.5 MG/2.5GM (1.62%) GEL; Apply 1 application topically daily.  Dispense: 7.5 g; Refill: 5  7. Need for hepatitis C screening test - Hepatitis C antibody  8. Hyperlipidemia with target LDL less than 100 Low fat diet - atorvastatin (LIPITOR) 40 MG tablet; TAKE 1 TABLET (40 MG TOTAL) BY MOUTH DAILY.  Dispense: 90 tablet; Refill: 1 - Lipid panel  9. Psoriatic arthritis Keep follow up appointments with rheumatologist  Labs pending Health maintenance reviewed Diet and exercise encouraged Continue all meds Follow up  In 3 months   [place], FNP

## 2015-05-14 LAB — CMP14+EGFR
ALBUMIN: 4.3 g/dL (ref 3.5–4.8)
ALK PHOS: 94 IU/L (ref 39–117)
ALT: 32 IU/L (ref 0–44)
AST: 23 IU/L (ref 0–40)
Albumin/Globulin Ratio: 1.9 (ref 1.2–2.2)
BUN / CREAT RATIO: 16 (ref 10–24)
BUN: 16 mg/dL (ref 8–27)
Bilirubin Total: 0.9 mg/dL (ref 0.0–1.2)
CALCIUM: 10.9 mg/dL — AB (ref 8.6–10.2)
CO2: 26 mmol/L (ref 18–29)
CREATININE: 1.01 mg/dL (ref 0.76–1.27)
Chloride: 94 mmol/L — ABNORMAL LOW (ref 96–106)
GFR calc Af Amer: 86 mL/min/{1.73_m2} (ref >59)
GFR, EST NON AFRICAN AMERICAN: 74 mL/min/{1.73_m2} (ref >59)
GLOBULIN, TOTAL: 2.3 g/dL (ref 1.5–4.5)
GLUCOSE: 107 mg/dL — AB (ref 65–99)
Potassium: 3.5 mmol/L (ref 3.5–5.2)
SODIUM: 137 mmol/L (ref 134–144)
Total Protein: 6.6 g/dL (ref 6.0–8.5)

## 2015-05-14 LAB — LIPID PANEL
CHOL/HDL RATIO: 2 ratio (ref 0.0–5.0)
CHOLESTEROL TOTAL: 103 mg/dL (ref 100–199)
HDL: 51 mg/dL (ref >39)
LDL CALC: 36 mg/dL (ref 0–99)
TRIGLYCERIDES: 82 mg/dL (ref 0–149)
VLDL CHOLESTEROL CAL: 16 mg/dL (ref 5–40)

## 2015-05-14 LAB — HEPATITIS C ANTIBODY

## 2015-05-22 ENCOUNTER — Other Ambulatory Visit: Payer: Self-pay | Admitting: Nurse Practitioner

## 2015-05-25 ENCOUNTER — Encounter: Payer: Self-pay | Admitting: *Deleted

## 2015-06-22 ENCOUNTER — Telehealth: Payer: Self-pay | Admitting: Nurse Practitioner

## 2015-06-22 NOTE — Telephone Encounter (Signed)
Patients daughter aware of results.

## 2015-06-22 NOTE — Telephone Encounter (Signed)
Spoke with Marita [place], she said Mr. Cobbett has been complaining of SOB with activity and wants to see MMM, he refuses to see anyone else.  I explained to her that MMM is out of the office until 07/05/15 and does not have an open appointment until 07/09/14.  I recommended to her that he be seen with someone else before then because there are so many things that could be wrong.  She said that she had tried to get him to but he is refusing to see anyone but MMM.  I made an appointment for him on 07/09/15 and told her to contact us before then if he worsened or to take him to the ER.

## 2015-07-09 ENCOUNTER — Ambulatory Visit (INDEPENDENT_AMBULATORY_CARE_PROVIDER_SITE_OTHER): Payer: Commercial Managed Care - HMO | Admitting: Nurse Practitioner

## 2015-07-09 ENCOUNTER — Encounter: Payer: Self-pay | Admitting: Nurse Practitioner

## 2015-07-09 VITALS — BP 144/72 | HR 86 | Temp 97.5°F | Ht 69.0 in | Wt 163.8 lb

## 2015-07-09 DIAGNOSIS — K219 Gastro-esophageal reflux disease without esophagitis: Secondary | ICD-10-CM | POA: Diagnosis not present

## 2015-07-09 DIAGNOSIS — R0602 Shortness of breath: Secondary | ICD-10-CM

## 2015-07-09 MED ORDER — OMEPRAZOLE 40 MG PO CPDR: 40.0000 mg | DELAYED_RELEASE_CAPSULE | Freq: Every day | ORAL | Status: DC

## 2015-07-09 MED ORDER — PANTOPRAZOLE SODIUM 40 MG PO TBEC: 40.0000 mg | DELAYED_RELEASE_TABLET | Freq: Every day | ORAL | Status: DC

## 2015-07-09 NOTE — Progress Notes (Signed)
   Subjective:    Patient ID: Rodney Hernandez, male    DOB: Apr 20, 1945, 70 y.o.   MRN: [id number]  HPI Patient comes in today accompanied by his daughter. He is c/o SOB and has a constant cough. He does have reflux which bothers him daily. He said he was playing golf about a month ago and he became very tired, SOB - he said that he got out of golf cart and his legs just crumbled with him and he fell. Lasted just a few seconds. But he has been very fatigued.    Review of Systems  Constitutional: Negative.  Negative for fever.  HENT: Negative.   Respiratory: Positive for cough and shortness of breath.   Cardiovascular: Negative.   Gastrointestinal: Negative.   Genitourinary: Negative.   Neurological: Negative.   Psychiatric/Behavioral: Negative.   All other systems reviewed and are negative.      Objective:   Physical Exam  Constitutional: He is oriented to person, place, and time. He appears well-developed and well-nourished. No distress.  Cardiovascular: Normal rate and normal heart sounds.   Pulmonary/Chest: Effort normal and breath sounds normal. No respiratory distress. He has no wheezes. He has no rales.  Constant cough  Neurological: He is alert and oriented to person, place, and time.  Skin: Skin is warm.  Psychiatric: He has a normal mood and affect. His behavior is normal. Judgment and thought content normal.   BP 144/72 mmHg  Pulse 86  Temp(Src) 97.5 F (36.4 C) (Oral)  Ht 5\' 9"  (1.753 m)  Wt 163 lb 12.8 oz (74.299 kg)  BMI 24.18 kg/m2  SpO2 99%  EKG- NSR- unchanged from Larae Grooms, FNP       Assessment & Plan:  1. Gastroesophageal reflux disease without esophagitis Increased from omeprazole 20mg  to 40mg  daily Avoid spicy foods Do not eat 2 hours prior to bedtime - omeprazole (PRILOSEC) 40 MG capsule; Take 1 capsule (40 mg total) by mouth daily.  Dispense: 30 capsule; Refill: 3  2. SOB (shortness of breath) - EKG 12-Lead - Ambulatory  referral to Cardiology   [place], FNP

## 2015-07-19 ENCOUNTER — Telehealth: Payer: Self-pay | Admitting: Nurse Practitioner

## 2015-07-19 NOTE — Telephone Encounter (Signed)
Pt given an appt tomorrow at 9:15 with MMM.

## 2015-07-20 ENCOUNTER — Ambulatory Visit (INDEPENDENT_AMBULATORY_CARE_PROVIDER_SITE_OTHER): Payer: Commercial Managed Care - HMO | Admitting: Nurse Practitioner

## 2015-07-20 ENCOUNTER — Ambulatory Visit (INDEPENDENT_AMBULATORY_CARE_PROVIDER_SITE_OTHER): Payer: Commercial Managed Care - HMO

## 2015-07-20 ENCOUNTER — Encounter: Payer: Self-pay | Admitting: Nurse Practitioner

## 2015-07-20 VITALS — BP 125/73 | HR 64 | Temp 97.1°F | Ht 69.0 in | Wt 164.0 lb

## 2015-07-20 DIAGNOSIS — R059 Cough, unspecified: Secondary | ICD-10-CM

## 2015-07-20 DIAGNOSIS — I700 Atherosclerosis of aorta: Secondary | ICD-10-CM | POA: Insufficient documentation

## 2015-07-20 DIAGNOSIS — R05 Cough: Secondary | ICD-10-CM | POA: Diagnosis not present

## 2015-07-20 DIAGNOSIS — J309 Allergic rhinitis, unspecified: Secondary | ICD-10-CM

## 2015-07-20 DIAGNOSIS — I10 Essential (primary) hypertension: Secondary | ICD-10-CM | POA: Diagnosis not present

## 2015-07-20 MED ORDER — AMLODIPINE BESYLATE 5 MG PO TABS: 5.0000 mg | ORAL_TABLET | Freq: Every day | ORAL | Status: DC

## 2015-07-20 MED ORDER — HYDROCHLOROTHIAZIDE 25 MG PO TABS: 25.0000 mg | ORAL_TABLET | Freq: Every day | ORAL | Status: DC

## 2015-07-20 NOTE — Patient Instructions (Signed)

## 2015-07-20 NOTE — Progress Notes (Signed)
   Subjective:    Patient ID: Rodney Hernandez, male    DOB: 1945/08/05, 70 y.o.   MRN: [id number]  HPI  Patient was seen 07/09/15 with c/o SOB and cough- he was treated with pantropazole for cough due to history of GERD and was not on any meds. EKG was negative and referral was made for cardiology referral. He comes in today c/o constantly having to clear his throat. The protonix has helped with gastric reflux but he finds hisself still clearing his throat. He could not tolerate Ace inhibitor due to cough nad was changed to losartan and has been on that for awhile - cough and clearing of throat just started  About 2 weeks ago   Review of Systems  Constitutional: Negative.  Negative for fever and appetite change.  HENT: Negative.   Respiratory: Positive for cough. Negative for shortness of breath.   Cardiovascular: Negative.   Gastrointestinal: Negative.   Genitourinary: Negative.   Neurological: Negative.   Psychiatric/Behavioral: Negative.   All other systems reviewed and are negative.      Objective:   Physical Exam  Constitutional: He is oriented to person, place, and time. He appears well-developed and well-nourished. No distress.  HENT:  Right Ear: Hearing, tympanic membrane, external ear and ear canal normal.  Left Ear: Hearing, tympanic membrane, external ear and ear canal normal.  Nose: Mucosal edema and rhinorrhea present. Right sinus exhibits no maxillary sinus tenderness and no frontal sinus tenderness. Left sinus exhibits no maxillary sinus tenderness and no frontal sinus tenderness.  Mouth/Throat: Uvula is midline, oropharynx is clear and moist and mucous membranes are normal.  Cardiovascular: Normal rate, regular rhythm and normal heart sounds.   Pulmonary/Chest: Effort normal and breath sounds normal.  Neurological: He is alert and oriented to person, place, and time.  Skin: Skin is warm and dry.  Psychiatric: He has a normal mood and affect. His behavior is normal. Judgment  and thought content normal.    BP 125/73 mmHg  Pulse 64  Temp(Src) 97.1 F (36.2 C) (Oral)  Ht 5\' 9"  (1.753 m)  Wt 164 lb (74.39 kg)  BMI 24.21 kg/m2  Chest x ray- unchanged from previous- no acute findings-Preliminary reading by Ronnald Collum, FNP  [facility]      Assessment & Plan:  1. Essential hypertension Stop losartan/HCTZ and see if will help with cough - amLODipine (NORVASC) 5 MG tablet; Take 1 tablet (5 mg total) by mouth daily.  Dispense: 30 tablet; Refill: 5 - hydrochlorothiazide (HYDRODIURIL) 25 MG tablet; Take 1 tablet (25 mg total) by mouth daily.  Dispense: 30 tablet; Refill: 5  2. Allergic rhinitis, unspecified allergic rhinitis type Use flonase daily- make sure cough not caused from allergies  3. Cough Force fluids - DG Chest 2 View; Future  RTO in 1-2 weeks if no improvement  Mary-Margaret Hassell Done, FNP

## 2015-07-21 DIAGNOSIS — M25561 Pain in right knee: Secondary | ICD-10-CM | POA: Diagnosis not present

## 2015-07-21 DIAGNOSIS — M170 Bilateral primary osteoarthritis of knee: Secondary | ICD-10-CM | POA: Diagnosis not present

## 2015-07-21 DIAGNOSIS — M2550 Pain in unspecified joint: Secondary | ICD-10-CM | POA: Diagnosis not present

## 2015-07-21 DIAGNOSIS — L4059 Other psoriatic arthropathy: Secondary | ICD-10-CM | POA: Diagnosis not present

## 2015-07-21 DIAGNOSIS — Z79899 Other long term (current) drug therapy: Secondary | ICD-10-CM | POA: Diagnosis not present

## 2015-08-02 ENCOUNTER — Ambulatory Visit (INDEPENDENT_AMBULATORY_CARE_PROVIDER_SITE_OTHER): Payer: Commercial Managed Care - HMO | Admitting: Cardiology

## 2015-08-02 VITALS — BP 126/76 | HR 66 | Ht 69.0 in | Wt 163.0 lb

## 2015-08-02 DIAGNOSIS — R0602 Shortness of breath: Secondary | ICD-10-CM | POA: Diagnosis not present

## 2015-08-02 DIAGNOSIS — I10 Essential (primary) hypertension: Secondary | ICD-10-CM | POA: Diagnosis not present

## 2015-08-02 NOTE — Patient Instructions (Signed)
Medication Instructions:  Your physician recommends that you continue on your current medications as directed. Please refer to the Current Medication list given to you today.   Labwork: none  Testing/Procedures: Your physician has requested that you have an echocardiogram. Echocardiography is a painless test that uses sound waves to create images of your heart. It provides your doctor with information about the size and shape of your heart and how well your heart's chambers and valves are working. This procedure takes approximately one hour. There are no restrictions for this procedure.    Follow-Up: Your physician recommends that you schedule a follow-up appointment in: to be determined based on test    Any Other Special Instructions Will Be Listed Below (If Applicable).     If you need a refill on your cardiac medications before your next appointment, please call your pharmacy.

## 2015-08-02 NOTE — Progress Notes (Signed)
Clinical Summary Rodney Hernandez is a 70 y.o.male seen today as a new patient, he is referred by NP Hassell Done.   1. SOB - DOE with activity which is fairly, example playing golf he gets significantly SOB. Used to tolerate quite well. Exertion is also limited by chronic leg pains as well.  - denies any chest pain. No LE edema. No orthopnea. No palpitations  CAD risk factors: HTN, Hyperlipidemia, former smoker x 20. Multiple family members on father's side with heart conditions.    2. HTN - checks at home at times, 140s/60s - recently had losartan/HCTZ stopped due to orthstatic symptoms.   3. Chronic cough  - being worked up by [employer]    Past Medical History:  Diagnosis Date  . Arthritis    psoriatic arthritis and osteo arthritis  . Asthma   . Collapse of lung   . Eczema   . GERD (gastroesophageal reflux disease)   . Hypertension   . Seasonal allergies   . Vertigo      Allergies  Allergen Reactions  . Nsaids Shortness Of Breath  . Fluticasone Rash    Of face  . Asa [Aspirin]   . Morphine And Related Hives and Itching  . Sulfa Antibiotics      Current Outpatient Prescriptions  Medication Sig Dispense Refill  . albuterol (PROVENTIL HFA;VENTOLIN HFA) 108 (90 Base) MCG/ACT inhaler Inhale 2 puffs into the lungs every 6 (six) hours as needed for wheezing or shortness of breath. 1 Inhaler 1  . amLODipine (NORVASC) 5 MG tablet Take 1 tablet (5 mg total) by mouth daily. 30 tablet 5  . Apremilast (OTEZLA) 30 MG TABS Take by mouth.    Marland Kitchen atorvastatin (LIPITOR) 40 MG tablet TAKE 1 TABLET (40 MG TOTAL) BY MOUTH DAILY. 90 tablet 1  . famotidine (PEPCID) 20 MG tablet Take 20 mg by mouth 2 (two) times daily.    . fluticasone (FLONASE) 50 MCG/ACT nasal spray Place into both nostrils daily.    . folic acid (FOLVITE) 1 MG tablet     . hydrochlorothiazide (HYDRODIURIL) 25 MG tablet Take 1 tablet (25 mg total) by mouth daily. 30 tablet 5  . methotrexate 25 MG/ML injection Inject 50 mg/m2  as directed once a week.     . pantoprazole (PROTONIX) 40 MG tablet TAKE 1 TABLET (40 MG TOTAL) BY MOUTH DAILY.  3  . Testosterone (ANDROGEL) 40.5 MG/2.5GM (1.62%) GEL Apply 1 application topically daily. 7.5 g 5   No current facility-administered medications for this visit.      Past Surgical History:  Procedure Laterality Date  . collapsed lung    . HERNIA REPAIR    . PROSTATE SURGERY       Allergies  Allergen Reactions  . Nsaids Shortness Of Breath  . Fluticasone Rash    Of face  . Asa [Aspirin]   . Morphine And Related Hives and Itching  . Sulfa Antibiotics       Family History  Problem Relation Age of Onset  . Cancer Father     lung ca  . Cancer Paternal Uncle     prostate ca  . Cancer Maternal Grandmother     stomach     Social History Rodney Hernandez reports that he has never smoked. He has never used smokeless tobacco. Rodney Hernandez reports that he does not drink alcohol.   Review of Systems CONSTITUTIONAL: No weight loss, fever, chills, weakness or fatigue.  HEENT: Eyes: No visual loss, blurred vision,  double vision or yellow sclerae.No hearing loss, sneezing, congestion, runny nose or sore throat.  SKIN: No rash or itching.  CARDIOVASCULAR: per HPI RESPIRATORY: per HPI GASTROINTESTINAL: No anorexia, nausea, vomiting or diarrhea. No abdominal pain or blood.  GENITOURINARY: No burning on urination, no polyuria NEUROLOGICAL: No headache, dizziness, syncope, paralysis, ataxia, numbness or tingling in the extremities. No change in bowel or bladder control.  MUSCULOSKELETAL: No muscle, back pain, joint pain or stiffness.  LYMPHATICS: No enlarged nodes. No history of splenectomy.  PSYCHIATRIC: No history of depression or anxiety.  ENDOCRINOLOGIC: No reports of sweating, cold or heat intolerance. No polyuria or polydipsia.  Marland Kitchen   Physical Examination Vitals:   08/02/15 1447  BP: 126/76  Pulse: 66   Vitals:   08/02/15 1447  Weight: 163 lb (73.9 kg)  Height:  5\' 9"  (1.753 m)    Gen: resting comfortably, no acute distress HEENT: no scleral icterus, pupils equal round and reactive, no palptable cervical adenopathy,  CV: RRR, no m/r/g, no jvd Resp: Clear to auscultation bilaterally GI: abdomen is soft, non-tender, non-distended, normal bowel sounds, no hepatosplenomegaly MSK: extremities are warm, no edema.  Skin: warm, no rash Neuro:  no focal deficits Psych: appropriate affect     Assessment and Plan  1. SOB - unclear etiology, he has several cardiac risk factors. EKG from pcp shows SR no ischemic changes - will initiate evaluation with echo, dependning on results consider Lexiscan, he cannot run on treadmill due to chronic leg pains  2. HTN - at goal, continue current meds  F/u pending test results.       Arnoldo Lenis, M.D

## 2015-08-03 ENCOUNTER — Encounter: Payer: Self-pay | Admitting: Cardiology

## 2015-08-10 ENCOUNTER — Ambulatory Visit (HOSPITAL_COMMUNITY)
Admission: RE | Admit: 2015-08-10 | Discharge: 2015-08-10 | Disposition: A | Discharge status: Home / Self Care | Payer: Commercial Managed Care - HMO | Source: Ambulatory Visit | Attending: Cardiology | Admitting: Cardiology

## 2015-08-10 ENCOUNTER — Telehealth: Payer: Self-pay | Admitting: Nurse Practitioner

## 2015-08-10 ENCOUNTER — Telehealth: Payer: Self-pay

## 2015-08-10 DIAGNOSIS — R0602 Shortness of breath: Secondary | ICD-10-CM

## 2015-08-10 DIAGNOSIS — I340 Nonrheumatic mitral (valve) insufficiency: Secondary | ICD-10-CM | POA: Insufficient documentation

## 2015-08-10 DIAGNOSIS — K219 Gastro-esophageal reflux disease without esophagitis: Secondary | ICD-10-CM | POA: Insufficient documentation

## 2015-08-10 DIAGNOSIS — E785 Hyperlipidemia, unspecified: Secondary | ICD-10-CM | POA: Insufficient documentation

## 2015-08-10 DIAGNOSIS — I071 Rheumatic tricuspid insufficiency: Secondary | ICD-10-CM | POA: Insufficient documentation

## 2015-08-10 DIAGNOSIS — I119 Hypertensive heart disease without heart failure: Secondary | ICD-10-CM | POA: Diagnosis not present

## 2015-08-10 DIAGNOSIS — I351 Nonrheumatic aortic (valve) insufficiency: Secondary | ICD-10-CM | POA: Insufficient documentation

## 2015-08-10 LAB — ECHOCARDIOGRAM COMPLETE
AVLVOTPG: 5 mmHg
CHL CUP RV SYS PRESS: 42 mmHg
EERAT: 9.22
EWDT: 225 ms
FS: 45 % — AB (ref 28–44)
IVS/LV PW RATIO, ED: 1.21
LA ID, A-P, ES: 37 mm
LA vol A4C: 38 ml
LA vol index: 21.9 mL/m2
LADIAMINDEX: 1.94 cm/m2
LAVOL: 41.7 mL
LEFT ATRIUM END SYS DIAM: 37 mm
LV E/e' medial: 9.22
LV E/e'average: 9.22
LV SIMPSON'S DISK: 71
LV TDI E'LATERAL: 10
LV dias vol index: 41 mL/m2
LV dias vol: 78 mL (ref 62–150)
LVELAT: 10 cm/s
LVOT SV: 86 mL
LVOT VTI: 22.5 cm
LVOT area: 3.8 cm2
LVOT peak vel: 107 cm/s
LVOTD: 22 mm
LVSYSVOL: 23 mL (ref 21–61)
LVSYSVOLIN: 12 mL/m2
MV Dec: 225
MV Peak grad: 3 mmHg
MV pk E vel: 92.2 m/s
MVPKAVEL: 53.8 m/s
P 1/2 time: 394 ms
PW: 8.7 mm — AB (ref 0.6–1.1)
RV LATERAL S' VELOCITY: 13.8 cm/s
RV TAPSE: 26 mm
Reg peak vel: 312 cm/s
Stroke v: 55 ml
TDI e' medial: 8.59
TR max vel: 312 cm/s

## 2015-08-10 NOTE — Telephone Encounter (Signed)
-----   Message from Arnoldo Lenis, MD sent at 08/10/2015 12:40 PM EDT ----- Madaline Brilliant, please document a phone note for him in his chart about not wanting stress test. That is ok with me, he can f/u with [place] in 6 weeks to readdress symptoms, or if a lung issue is diagnosed and treated ok to push that appointment back 6 months  Zandra Abts MD ----- Message ----- From: Drema [place], CMA Sent: 08/10/2015  11:59 AM To: Arnoldo Lenis, MD  Called pt to give him results to his echo, and explained you wanted him to have  a lexi to rule out any blockages that may be causing his SOB symptoms. Pt stated that he does not want to have any more testing on his heart as he thinks it is his lungs. He is going to try to see a pulmonologist.

## 2015-08-10 NOTE — Progress Notes (Signed)
*  PRELIMINARY RESULTS* Echocardiogram 2D Echocardiogram has been performed.  Rodney Hernandez 08/10/2015, 9:05 AM

## 2015-08-10 NOTE — Telephone Encounter (Signed)
Pt stated he does not want to have any more cardiac testing done, because he personally feels it is his lungs and not his heart. Pt stated he will follow up with a pulmonologist. I told pt to inform us if he got am appointment with Dr.Hawkins.

## 2015-08-11 NOTE — Telephone Encounter (Signed)
Patient is still having cough and congestion. He saw cardio as you requested and everything is fine. He is not allowed to have his methotrexate while he is sick and hasn't been able to take it for 3 weeks. Wants to know if you will call him in an antibiotic or if he needs to come in and be seen. Please advise and route back to me so I can call patient today

## 2015-08-12 ENCOUNTER — Ambulatory Visit: Payer: Self-pay | Admitting: Nurse Practitioner

## 2015-08-12 MED ORDER — AZITHROMYCIN 250 MG PO TABS: ORAL_TABLET | ORAL | 0 refills | Status: DC

## 2015-08-12 NOTE — Telephone Encounter (Signed)
Patient aware.

## 2015-08-21 ENCOUNTER — Telehealth: Payer: Self-pay | Admitting: Nurse Practitioner

## 2015-08-21 NOTE — Telephone Encounter (Signed)
Pt had a little injury at home and Td was 02/2014 - up to date -- aware

## 2015-08-23 ENCOUNTER — Telehealth: Payer: Self-pay

## 2015-08-23 DIAGNOSIS — R0602 Shortness of breath: Secondary | ICD-10-CM

## 2015-08-23 DIAGNOSIS — L570 Actinic keratosis: Secondary | ICD-10-CM | POA: Diagnosis not present

## 2015-08-23 NOTE — Telephone Encounter (Signed)
Referral made 

## 2015-08-30 ENCOUNTER — Ambulatory Visit (INDEPENDENT_AMBULATORY_CARE_PROVIDER_SITE_OTHER): Payer: Commercial Managed Care - HMO | Admitting: Nurse Practitioner

## 2015-08-30 ENCOUNTER — Encounter: Payer: Self-pay | Admitting: Nurse Practitioner

## 2015-08-30 VITALS — BP 131/75 | HR 67 | Temp 97.6°F | Ht 69.0 in | Wt 169.0 lb

## 2015-08-30 DIAGNOSIS — R053 Chronic cough: Secondary | ICD-10-CM

## 2015-08-30 DIAGNOSIS — R05 Cough: Secondary | ICD-10-CM

## 2015-08-30 NOTE — Progress Notes (Addendum)
   Subjective:    Patient ID: Rodney Hernandez, male    DOB: Jan 05, 1946, 70 y.o.   MRN: [id number]  HPI Patient comes in today still c/o cough- he was seen C/O shortness of breath and cough 07/09/15 pin point any specific cause of cugh - was given protonix  thinking it was gastric reflux and he was referred to cardiologist to rule out heart. He saw a cardiologist on 08/02/15 and had an echo cardiogram which was normal and he has referred him to Pulmonologist but has not heard about appointment yet. He takes his protonix daily which has helped a little. Use flonase daily nad has avoid al hot drinks. Cough is constant. Very hoarse- some pain with wallowing and chokes easy.  Review of Systems  Constitutional: Negative.   HENT: Negative.  Negative for congestion.   Respiratory: Positive for cough (constant- dry cough).   Cardiovascular: Negative.   Gastrointestinal: Negative.   Genitourinary: Negative.   Neurological: Negative.   Psychiatric/Behavioral: Negative.   All other systems reviewed and are negative.      Objective:   Physical Exam  Constitutional: He is oriented to person, place, and time. He appears well-developed and well-nourished.  Eyes: Pupils are equal, round, and reactive to light.  Neck: Normal range of motion.  Cardiovascular: Normal rate and normal heart sounds.   Pulmonary/Chest: Effort normal.  Dry cough  Neurological: He is alert and oriented to person, place, and time.  Skin: Skin is warm.  Psychiatric: He has a normal mood and affect. His behavior is normal. Judgment and thought content normal.   BP 131/75   Pulse 67   Temp 97.6 F (36.4 C) (Oral)   Ht 5\' 9"  (1.753 m)   Wt 169 lb (76.7 kg)   BMI 24.96 kg/m      Assessment & Plan:   1. Chronic cough    Needs ct scan or MRI- will need to call radiology to see exactly what needs to be ordered and will order stat Will decide where to go from here once get scan results.  Orders Placed This Encounter    Procedures  . DG Esophagus    Standing Status:   Future    Standing Expiration Date:   10/30/2016    Order Specific Question:   Reason for Exam (SYMPTOM  OR DIAGNOSIS REQUIRED)    Answer:   chronic cough    Order Specific Question:   Preferred imaging location?    Answer:   [facility]  . Ambulatory referral to Gastroenterology    Referral Priority:   Urgent    Referral Type:   Consultation    Referral Reason:   Specialty Services Required    Number of Visits Requested:   [place], [place]

## 2015-08-31 NOTE — Addendum Note (Signed)
Addended by: Chevis Pretty on: 08/31/2015 08:10 AM   Modules accepted: Orders

## 2015-09-03 ENCOUNTER — Ambulatory Visit (HOSPITAL_COMMUNITY)
Admission: RE | Admit: 2015-09-03 | Discharge: 2015-09-03 | Disposition: A | Discharge status: Home / Self Care | Payer: Commercial Managed Care - HMO | Source: Ambulatory Visit | Attending: Nurse Practitioner | Admitting: Nurse Practitioner

## 2015-09-03 DIAGNOSIS — K219 Gastro-esophageal reflux disease without esophagitis: Secondary | ICD-10-CM | POA: Insufficient documentation

## 2015-09-03 DIAGNOSIS — K449 Diaphragmatic hernia without obstruction or gangrene: Secondary | ICD-10-CM | POA: Diagnosis not present

## 2015-09-03 DIAGNOSIS — R05 Cough: Secondary | ICD-10-CM | POA: Diagnosis not present

## 2015-09-03 DIAGNOSIS — R053 Chronic cough: Secondary | ICD-10-CM

## 2015-09-08 ENCOUNTER — Encounter: Payer: Self-pay | Admitting: Internal Medicine

## 2015-09-08 ENCOUNTER — Ambulatory Visit (INDEPENDENT_AMBULATORY_CARE_PROVIDER_SITE_OTHER): Payer: Commercial Managed Care - HMO | Admitting: Internal Medicine

## 2015-09-08 VITALS — BP 134/72 | HR 67 | Ht 67.52 in | Wt 168.2 lb

## 2015-09-08 DIAGNOSIS — R053 Chronic cough: Secondary | ICD-10-CM

## 2015-09-08 DIAGNOSIS — R05 Cough: Secondary | ICD-10-CM

## 2015-09-08 MED ORDER — PANTOPRAZOLE SODIUM 40 MG PO TBEC: 40.0000 mg | DELAYED_RELEASE_TABLET | Freq: Two times a day (BID) | ORAL | 2 refills | Status: DC

## 2015-09-08 NOTE — Patient Instructions (Addendum)
We have sent the following medications to your pharmacy for you to pick up at your convenience: Pantoprazole 40 mg twice daily (increase from once daily dosing)  Decrease your pepcid (famotidine) to once daily dosing (at night time).  Keep pulmonology (lung doctor) appointments.  Ask your doctor about chronic cough clinic at [facility]  We have given you handout on GERD precautions that you should read over and follow.  If you are age 72 or older, your body mass index should be between 23-30. Your Body mass index is 25.95 kg/m. If this is out of the aforementioned range listed, please consider follow up with your Primary Care Provider.  If you are age 40 or younger, your body mass index should be between 19-25. Your Body mass index is 25.95 kg/m. If this is out of the aformentioned range listed, please consider follow up with your Primary Care Provider.

## 2015-09-08 NOTE — Progress Notes (Signed)
HISTORY OF PRESENT ILLNESS:  Rodney Hernandez is a 70 y.o. male with past medical history as listed below who is referred by his primary care provider Rodney Hernandez regarding chronic cough which she believes may be secondary to GERD. Patient has had prior GI care elsewhere. Looks like most recently Dr. Lara Mulch Hernandez ([facility]) who performed surveillance colonoscopy and upper endoscopy 07/27/2014. Upper endoscopy was entirely normal except for a 4 cm hiatal hernia. 2 polyps in the ascending colon were removed. The exam was otherwise normal. The polyp was adenomatous and follow-up in 3 years with that doctor recommended. Patient is accompanied today by his daughter. He tells me that despite having chronic problems with classic GERD symptoms she's never had issues with cough until 3 months ago when after an upper respiratory illness developed cough. The problem has been associated with chronic throat clearing. Multiple episodes of throat clearing and cough throughout the day. No problems however after falling asleep. He was placed on pantoprazole and Pepcid. No change in cough but it did improve any breakthrough reflux symptoms. No dysphagia. He has tried throat lozenges. No prescription medications for cough. No formal evaluation with pulmonary or ENT.  REVIEW OF SYSTEMS:  All non-GI ROS negative except for arthritis, asthma, seasonal allergies  Past Medical History:  Diagnosis Date  . Arthritis    psoriatic arthritis and osteo arthritis  . Asthma   . Collapse of lung   . Colon polyps   . Eczema   . GERD (gastroesophageal reflux disease)   . Hypertension   . Seasonal allergies   . Vertigo     Past Surgical History:  Procedure Laterality Date  . collapsed lung    . HERNIA REPAIR    . PROSTATE SURGERY      Social History Rodney Hernandez  reports that he has never smoked. He has never used smokeless tobacco. He reports that he does not drink alcohol or use  drugs.  family history includes Lung cancer in his father; Prostate cancer in his father and paternal uncle; Stomach cancer in his maternal grandmother.  Allergies  Allergen Reactions  . Nsaids Shortness Of Breath  . Fluticasone Rash    Of face  . Asa [Aspirin]   . Morphine And Related Hives and Itching  . Sulfa Antibiotics        PHYSICAL EXAMINATION: Vital signs: BP 134/72   Pulse 67   Ht 5' 7.52" (1.715 m) Comment: w/o shoes  Wt 168 lb 4 oz (76.3 kg)   SpO2 (!) 67%   BMI 25.95 kg/m   Constitutional: Pleasant, generally well-appearing, no acute distress Psychiatric: alert and oriented x3, cooperative Eyes: extraocular movements intact, anicteric, conjunctiva pink Mouth: oral pharynx moist, no lesions Neck: supple no lymphadenopathy Cardiovascular: heart regular rate and rhythm, no murmur Lungs: clear to auscultation bilaterally Abdomen: soft, nontender, nondistended, no obvious ascites, no peritoneal signs, normal bowel sounds, no organomegaly Rectal: Omitted Extremities: no clubbing cyanosis or lower extremity edema bilaterally Skin: no lesions on visible extremities Neuro: No focal deficits. Cranial nerves intact  ASSESSMENT:  #1. Chronic cough after upper respiratory illness. I do not think this is GERD #2. History of GERD. No symptoms on current acid suppressive regimen. Normal EGD last year save hiatal hernia #3. History of adenomatous colon polyps. Undergoing surveillance elsewhere   PLAN:  1. Reflux precautions 2. Empirically increase dose of PPI to twice daily and H2 receptor antagonist at night for 2 months all see if  any additional benefit gain. If not resume lowest dose of PPI to control reflux symptoms 3. The patient tells me that he has pulmonary evaluation. I would recommend he keep this. May also need ENT evaluation. Finally, I understand that [facility] has a chronic cough clinic. This may be helpful as well. I will defer all these decisions to  his PCP. No plans for GI follow up in this clinic unless otherwise requested by primary provider.

## 2015-09-20 DIAGNOSIS — J45909 Unspecified asthma, uncomplicated: Secondary | ICD-10-CM | POA: Diagnosis not present

## 2015-09-20 DIAGNOSIS — J301 Allergic rhinitis due to pollen: Secondary | ICD-10-CM | POA: Diagnosis not present

## 2015-09-20 DIAGNOSIS — K219 Gastro-esophageal reflux disease without esophagitis: Secondary | ICD-10-CM | POA: Diagnosis not present

## 2015-10-08 ENCOUNTER — Telehealth: Payer: Self-pay

## 2015-10-08 MED ORDER — PANTOPRAZOLE SODIUM 40 MG PO TBEC: 40.0000 mg | DELAYED_RELEASE_TABLET | Freq: Two times a day (BID) | ORAL | 1 refills | Status: DC

## 2015-10-08 NOTE — Telephone Encounter (Signed)
Sent 90 day supply of Pantoprazole per patient request

## 2015-10-11 ENCOUNTER — Ambulatory Visit (INDEPENDENT_AMBULATORY_CARE_PROVIDER_SITE_OTHER): Payer: Commercial Managed Care - HMO

## 2015-10-11 DIAGNOSIS — Z23 Encounter for immunization: Secondary | ICD-10-CM

## 2015-10-21 DIAGNOSIS — M170 Bilateral primary osteoarthritis of knee: Secondary | ICD-10-CM | POA: Diagnosis not present

## 2015-10-21 DIAGNOSIS — L4059 Other psoriatic arthropathy: Secondary | ICD-10-CM | POA: Diagnosis not present

## 2015-10-21 DIAGNOSIS — M2550 Pain in unspecified joint: Secondary | ICD-10-CM | POA: Diagnosis not present

## 2015-10-21 DIAGNOSIS — Z79899 Other long term (current) drug therapy: Secondary | ICD-10-CM | POA: Diagnosis not present

## 2015-11-10 ENCOUNTER — Other Ambulatory Visit: Payer: Self-pay | Admitting: Nurse Practitioner

## 2015-11-10 DIAGNOSIS — E785 Hyperlipidemia, unspecified: Secondary | ICD-10-CM

## 2015-11-23 DIAGNOSIS — K210 Gastro-esophageal reflux disease with esophagitis: Secondary | ICD-10-CM | POA: Diagnosis not present

## 2015-11-23 DIAGNOSIS — I10 Essential (primary) hypertension: Secondary | ICD-10-CM | POA: Diagnosis not present

## 2015-11-23 DIAGNOSIS — J449 Chronic obstructive pulmonary disease, unspecified: Secondary | ICD-10-CM | POA: Diagnosis not present

## 2015-12-08 ENCOUNTER — Other Ambulatory Visit: Payer: Self-pay

## 2015-12-08 ENCOUNTER — Telehealth: Payer: Self-pay

## 2015-12-08 DIAGNOSIS — E291 Testicular hypofunction: Secondary | ICD-10-CM

## 2015-12-08 NOTE — Telephone Encounter (Signed)
Needs to be seen-- last androgel refill 6 mo ago, needs to be seen every 6 mo for androgel refills, due for yearly CBC needed while on androgel, last in 12/2014.

## 2015-12-08 NOTE — Telephone Encounter (Signed)
Levada Dy from [facility] called to request a refill on patients testosterone 10% cream, 1 ML each morning. Please advise and send back to the pools. Covering PCP.

## 2015-12-08 NOTE — Telephone Encounter (Signed)
Left detailed message that patient would need to be seen for any refills.

## 2015-12-09 DIAGNOSIS — Z79899 Other long term (current) drug therapy: Secondary | ICD-10-CM | POA: Diagnosis not present

## 2015-12-09 DIAGNOSIS — M25561 Pain in right knee: Secondary | ICD-10-CM | POA: Diagnosis not present

## 2015-12-09 DIAGNOSIS — L4059 Other psoriatic arthropathy: Secondary | ICD-10-CM | POA: Diagnosis not present

## 2015-12-09 DIAGNOSIS — M170 Bilateral primary osteoarthritis of knee: Secondary | ICD-10-CM | POA: Diagnosis not present

## 2015-12-09 DIAGNOSIS — Z6824 Body mass index (BMI) 24.0-24.9, adult: Secondary | ICD-10-CM | POA: Diagnosis not present

## 2015-12-09 DIAGNOSIS — M25562 Pain in left knee: Secondary | ICD-10-CM | POA: Diagnosis not present

## 2015-12-09 DIAGNOSIS — M2550 Pain in unspecified joint: Secondary | ICD-10-CM | POA: Diagnosis not present

## 2015-12-13 MED ORDER — TESTOSTERONE 40.5 MG/2.5GM (1.62%) TD GEL: 1.0000 "application " | Freq: Every day | TRANSDERMAL | 2 refills | Status: DC

## 2015-12-14 ENCOUNTER — Other Ambulatory Visit: Payer: Self-pay | Admitting: *Deleted

## 2015-12-14 DIAGNOSIS — I10 Essential (primary) hypertension: Secondary | ICD-10-CM

## 2015-12-14 MED ORDER — HYDROCHLOROTHIAZIDE 25 MG PO TABS: 25.0000 mg | ORAL_TABLET | Freq: Every day | ORAL | 0 refills | Status: DC

## 2015-12-21 ENCOUNTER — Encounter: Payer: Self-pay | Admitting: Nurse Practitioner

## 2015-12-21 ENCOUNTER — Ambulatory Visit (INDEPENDENT_AMBULATORY_CARE_PROVIDER_SITE_OTHER): Payer: Commercial Managed Care - HMO | Admitting: Nurse Practitioner

## 2015-12-21 VITALS — BP 144/76 | HR 75 | Temp 96.9°F | Ht 67.0 in | Wt 166.0 lb

## 2015-12-21 DIAGNOSIS — E785 Hyperlipidemia, unspecified: Secondary | ICD-10-CM | POA: Diagnosis not present

## 2015-12-21 DIAGNOSIS — N401 Enlarged prostate with lower urinary tract symptoms: Secondary | ICD-10-CM | POA: Diagnosis not present

## 2015-12-21 DIAGNOSIS — I10 Essential (primary) hypertension: Secondary | ICD-10-CM

## 2015-12-21 DIAGNOSIS — I700 Atherosclerosis of aorta: Secondary | ICD-10-CM

## 2015-12-21 DIAGNOSIS — K219 Gastro-esophageal reflux disease without esophagitis: Secondary | ICD-10-CM

## 2015-12-21 DIAGNOSIS — L4050 Arthropathic psoriasis, unspecified: Secondary | ICD-10-CM

## 2015-12-21 DIAGNOSIS — E291 Testicular hypofunction: Secondary | ICD-10-CM

## 2015-12-21 MED ORDER — AMLODIPINE BESYLATE 5 MG PO TABS: 5.0000 mg | ORAL_TABLET | Freq: Every day | ORAL | 5 refills | Status: DC

## 2015-12-21 MED ORDER — OLMESARTAN MEDOXOMIL-HCTZ 20-12.5 MG PO TABS: 1.0000 | ORAL_TABLET | Freq: Every day | ORAL | 1 refills | Status: DC

## 2015-12-21 MED ORDER — ATORVASTATIN CALCIUM 40 MG PO TABS: ORAL_TABLET | ORAL | 1 refills | Status: DC

## 2015-12-21 NOTE — Progress Notes (Signed)
Subjective:    Patient ID: Rodney Hernandez, male    DOB: 1945-07-13, 70 y.o.   MRN: [id number]  Patient here today for annual exam and follow up of chronic medical problems.  Outpatient Encounter Prescriptions as of 05/13/2015  Medication Sig  . albuterol (PROVENTIL HFA;VENTOLIN HFA) 108 (90 BASE) MCG/ACT inhaler Inhale 2 puffs into the lungs every 6 (six) hours as needed for wheezing or shortness of breath.  . Apremilast (OTEZLA) 30 MG TABS Take by mouth.  Marland Kitchen atorvastatin (LIPITOR) 40 MG tablet TAKE 1 TABLET (40 MG TOTAL) BY MOUTH DAILY.  . fluticasone (FLONASE) 50 MCG/ACT nasal spray Place into both nostrils daily.  . folic acid (FOLVITE) 1 MG tablet   . losartan-hydrochlorothiazide (HYZAAR) 100-25 MG tablet TAKE 1 TABLET BY MOUTH DAILY.  . methotrexate 25 MG/ML injection Inject 50 mg/m2 as directed once a week.   Marland Kitchen omeprazole (PRILOSEC) 20 MG capsule TAKE 1 CAPSULE BY MOUTH 2 TIMES DAILY  . omeprazole (PRILOSEC) 20 MG capsule TAKE 1 CAPSULE BY MOUTH 2 TIMES DAILY  . Testosterone (ANDROGEL) 40.5 MG/2.5GM (1.62%) GEL Apply 1 application topically daily.   * Patient has had a chronic cough- we sent him to GI and they did not feel that it was reflux, but did give him PPI to take BID to see if helped with cough. So protonix did not help so stopped taking. Saw pulmologist and he said that he did not see anything going on. He was put on inhalers and he could not tolerate. Had follow up visit and was told he was okay.      Hypertension  This is a chronic problem. The current episode started more than 1 year ago. The problem is unchanged. The problem is controlled. Pertinent negatives include no headaches, neck pain or shortness of breath. Risk factors for coronary artery disease include dyslipidemia and male gender. Past treatments include angiotensin blockers and diuretics. Compliance problems include diet.   Hyperlipidemia  This is a chronic problem. The current episode started more than 1 year ago.  Recent lipid tests were reviewed and are variable. Pertinent negatives include no shortness of breath. Current antihyperlipidemic treatment includes statins. The current treatment provides moderate improvement of lipids. Compliance problems include adherence to diet.  Risk factors for coronary artery disease include dyslipidemia, hypertension and male sex.  allergic rhinitis Patient used flonase in the past. They thought he was having allergic reaction to it. But come to find out it was reaction to face masks that he was wearing. Patient says that he has been sneezing a lot from working outside.  Asthma Aggravated by outside allergens. Worse since being off flonase BPH Recently had a TURP and says that he still is having problems urinating. Eczema Worsening- Wants to see dermatologist Osteo arthritis/psoriatic arthritis Has seen rheumatologist and has started on otezla and methotrexate injections Vertigo  meclizine helps but patient is out of meds GERD ompeprazole keeps symptoms under control Hypogonadism androgel daily- no c/o fatigue     Review of Systems  Constitutional: Negative.   HENT: Negative.   Respiratory: Negative for shortness of breath.   Genitourinary: Negative.        Denies urinary incontinence  Musculoskeletal: Negative for neck pain.  Neurological: Negative for headaches.  Psychiatric/Behavioral: Negative.   All other systems reviewed and are negative.      Objective:   Physical Exam  Constitutional: He is oriented to person, place, and time. He appears well-developed and well-nourished.  HENT:  Head: Normocephalic.  Right Ear: External ear normal.  Left Ear: External ear normal.  Nose: Nose normal.  Mouth/Throat: Oropharynx is clear and moist.  Eyes: EOM are normal. Pupils are equal, round, and reactive to light.  Neck: Normal range of motion. Neck supple. No thyromegaly present.  Cardiovascular: Normal rate, regular rhythm, normal heart sounds and  intact distal pulses.   No murmur heard. Pulmonary/Chest: Effort normal and breath sounds normal. He has no wheezes. He has no rales.  Abdominal: Soft. Bowel sounds are normal.  Genitourinary:  Genitourinary Comments: Hx prostatectomy   Musculoskeletal: Normal range of motion.  Neurological: He is alert and oriented to person, place, and time.  Skin: Skin is warm and dry.  Psychiatric: He has a normal mood and affect. His behavior is normal. Judgment and thought content normal.   BP (!) 144/76 (BP Location: Left Arm, Cuff Size: Normal)   Pulse 75   Temp (!) 96.9 F (36.1 C) (Oral)   Ht '5\' 7"'  (1.702 m)   Wt 166 lb (75.3 kg)   BMI 26.00 kg/m       Assessment & Plan:  1. Essential hypertension Low sodium diet - olmesartan-hydrochlorothiazide (BENICAR HCT) 20-12.5 MG tablet; Take 1 tablet by mouth daily.  Dispense: 90 tablet; Refill: 1 - amLODipine (NORVASC) 5 MG tablet; Take 1 tablet (5 mg total) by mouth daily.  Dispense: 30 tablet; Refill: 5 - CMP14+EGFR  2. Hyperlipidemia with target LDL less than 100 Low fat diet - atorvastatin (LIPITOR) 40 MG tablet; TAKE 1 TABLET (40 MG TOTAL) BY MOUTH DAILY.  Dispense: 90 tablet; Refill: 1 - Lipid panel  3. Aortic atherosclerosis ([place])  4. Gastroesophageal reflux disease without esophagitis Avoid spicy foods Do not eat 2 hours prior to bedtime  5. Hypogonadism in male  64. Benign prostatic hyperplasia with lower urinary tract symptoms, symptom details unspecified  7. Psoriatic arthritis ([place]) Continue to see rheumatologist    Labs pending Health maintenance reviewed Diet and exercise encouraged Continue all meds Follow up  In 3 months   [place], FNP

## 2015-12-21 NOTE — Patient Instructions (Signed)

## 2015-12-22 ENCOUNTER — Telehealth: Payer: Self-pay | Admitting: Nurse Practitioner

## 2015-12-22 LAB — CMP14+EGFR
A/G RATIO: 2 (ref 1.2–2.2)
ALBUMIN: 4.3 g/dL (ref 3.5–4.8)
ALT: 27 IU/L (ref 0–44)
AST: 19 IU/L (ref 0–40)
Alkaline Phosphatase: 121 IU/L — ABNORMAL HIGH (ref 39–117)
BUN / CREAT RATIO: 13 (ref 10–24)
BUN: 14 mg/dL (ref 8–27)
Bilirubin Total: 0.5 mg/dL (ref 0.0–1.2)
CALCIUM: 10.1 mg/dL (ref 8.6–10.2)
CHLORIDE: 94 mmol/L — AB (ref 96–106)
CO2: 25 mmol/L (ref 18–29)
CREATININE: 1.08 mg/dL (ref 0.76–1.27)
GFR calc Af Amer: 79 mL/min/{1.73_m2} (ref >59)
GFR calc non Af Amer: 68 mL/min/{1.73_m2} (ref >59)
GLOBULIN, TOTAL: 2.1 g/dL (ref 1.5–4.5)
Glucose: 118 mg/dL — ABNORMAL HIGH (ref 65–99)
Potassium: 3.4 mmol/L — ABNORMAL LOW (ref 3.5–5.2)
Sodium: 136 mmol/L (ref 134–144)
TOTAL PROTEIN: 6.4 g/dL (ref 6.0–8.5)

## 2015-12-22 LAB — LIPID PANEL
CHOLESTEROL TOTAL: 118 mg/dL (ref 100–199)
Chol/HDL Ratio: 2.3 ratio units (ref 0.0–5.0)
HDL: 52 mg/dL (ref >39)
LDL Calculated: 54 mg/dL (ref 0–99)
TRIGLYCERIDES: 60 mg/dL (ref 0–149)
VLDL CHOLESTEROL CAL: 12 mg/dL (ref 5–40)

## 2015-12-23 ENCOUNTER — Other Ambulatory Visit: Payer: Self-pay | Admitting: Nurse Practitioner

## 2015-12-23 DIAGNOSIS — J4520 Mild intermittent asthma, uncomplicated: Secondary | ICD-10-CM

## 2015-12-23 DIAGNOSIS — E291 Testicular hypofunction: Secondary | ICD-10-CM

## 2015-12-23 MED ORDER — ALBUTEROL SULFATE HFA 108 (90 BASE) MCG/ACT IN AERS: 2.0000 | INHALATION_SPRAY | Freq: Four times a day (QID) | RESPIRATORY_TRACT | 1 refills | Status: DC | PRN

## 2015-12-23 MED ORDER — TESTOSTERONE 40.5 MG/2.5GM (1.62%) TD GEL
1.0000 | Freq: Every day | TRANSDERMAL | 2 refills | Status: DC
Start: 2015-12-23 — End: 2016-05-09

## 2015-12-23 NOTE — Progress Notes (Signed)
Pt aware and will come pick up the rx.

## 2016-01-24 ENCOUNTER — Other Ambulatory Visit: Payer: Self-pay | Admitting: Nurse Practitioner

## 2016-01-24 ENCOUNTER — Other Ambulatory Visit: Payer: Medicare HMO

## 2016-01-24 DIAGNOSIS — L4050 Arthropathic psoriasis, unspecified: Secondary | ICD-10-CM

## 2016-01-24 DIAGNOSIS — L4059 Other psoriatic arthropathy: Secondary | ICD-10-CM | POA: Diagnosis not present

## 2016-02-23 DIAGNOSIS — L570 Actinic keratosis: Secondary | ICD-10-CM | POA: Diagnosis not present

## 2016-02-23 DIAGNOSIS — D044 Carcinoma in situ of skin of scalp and neck: Secondary | ICD-10-CM | POA: Diagnosis not present

## 2016-02-23 DIAGNOSIS — D485 Neoplasm of uncertain behavior of skin: Secondary | ICD-10-CM | POA: Diagnosis not present

## 2016-02-23 DIAGNOSIS — Z85828 Personal history of other malignant neoplasm of skin: Secondary | ICD-10-CM | POA: Diagnosis not present

## 2016-03-02 DIAGNOSIS — C4442 Squamous cell carcinoma of skin of scalp and neck: Secondary | ICD-10-CM | POA: Diagnosis not present

## 2016-03-02 DIAGNOSIS — D485 Neoplasm of uncertain behavior of skin: Secondary | ICD-10-CM | POA: Diagnosis not present

## 2016-03-02 DIAGNOSIS — L570 Actinic keratosis: Secondary | ICD-10-CM | POA: Diagnosis not present

## 2016-04-20 DIAGNOSIS — Z79899 Other long term (current) drug therapy: Secondary | ICD-10-CM | POA: Diagnosis not present

## 2016-04-20 DIAGNOSIS — M2550 Pain in unspecified joint: Secondary | ICD-10-CM | POA: Diagnosis not present

## 2016-04-20 DIAGNOSIS — L4059 Other psoriatic arthropathy: Secondary | ICD-10-CM | POA: Diagnosis not present

## 2016-04-20 DIAGNOSIS — M170 Bilateral primary osteoarthritis of knee: Secondary | ICD-10-CM | POA: Diagnosis not present

## 2016-04-20 DIAGNOSIS — M25561 Pain in right knee: Secondary | ICD-10-CM | POA: Diagnosis not present

## 2016-04-20 DIAGNOSIS — M25562 Pain in left knee: Secondary | ICD-10-CM | POA: Diagnosis not present

## 2016-04-20 DIAGNOSIS — E663 Overweight: Secondary | ICD-10-CM | POA: Diagnosis not present

## 2016-04-20 DIAGNOSIS — Z6825 Body mass index (BMI) 25.0-25.9, adult: Secondary | ICD-10-CM | POA: Diagnosis not present

## 2016-04-20 DIAGNOSIS — M150 Primary generalized (osteo)arthritis: Secondary | ICD-10-CM | POA: Diagnosis not present

## 2016-04-28 DIAGNOSIS — H25813 Combined forms of age-related cataract, bilateral: Secondary | ICD-10-CM | POA: Diagnosis not present

## 2016-04-28 DIAGNOSIS — H353131 Nonexudative age-related macular degeneration, bilateral, early dry stage: Secondary | ICD-10-CM | POA: Diagnosis not present

## 2016-04-28 DIAGNOSIS — D3132 Benign neoplasm of left choroid: Secondary | ICD-10-CM | POA: Diagnosis not present

## 2016-05-09 ENCOUNTER — Encounter: Payer: Self-pay | Admitting: Nurse Practitioner

## 2016-05-09 ENCOUNTER — Ambulatory Visit (INDEPENDENT_AMBULATORY_CARE_PROVIDER_SITE_OTHER): Payer: Medicare HMO | Admitting: Nurse Practitioner

## 2016-05-09 VITALS — BP 119/63 | HR 64 | Temp 97.4°F | Ht 67.0 in | Wt 175.0 lb

## 2016-05-09 DIAGNOSIS — L4050 Arthropathic psoriasis, unspecified: Secondary | ICD-10-CM

## 2016-05-09 DIAGNOSIS — N401 Enlarged prostate with lower urinary tract symptoms: Secondary | ICD-10-CM

## 2016-05-09 DIAGNOSIS — I10 Essential (primary) hypertension: Secondary | ICD-10-CM | POA: Diagnosis not present

## 2016-05-09 DIAGNOSIS — I700 Atherosclerosis of aorta: Secondary | ICD-10-CM | POA: Diagnosis not present

## 2016-05-09 DIAGNOSIS — Z125 Encounter for screening for malignant neoplasm of prostate: Secondary | ICD-10-CM | POA: Diagnosis not present

## 2016-05-09 DIAGNOSIS — K219 Gastro-esophageal reflux disease without esophagitis: Secondary | ICD-10-CM | POA: Diagnosis not present

## 2016-05-09 DIAGNOSIS — E291 Testicular hypofunction: Secondary | ICD-10-CM

## 2016-05-09 DIAGNOSIS — E785 Hyperlipidemia, unspecified: Secondary | ICD-10-CM

## 2016-05-09 MED ORDER — TESTOSTERONE 40.5 MG/2.5GM (1.62%) TD GEL: 1.0000 "application " | Freq: Every day | TRANSDERMAL | 2 refills | Status: DC

## 2016-05-09 NOTE — Progress Notes (Signed)
Subjective:    Patient ID: Rodney Hernandez, male    DOB: 1945-04-21, 71 y.o.   MRN: [id number]  HPI Rodney Hernandez is here today for follow up of chronic medical problem.  Outpatient Encounter Prescriptions as of 05/09/2016  Medication Sig  . amLODipine (NORVASC) 5 MG tablet Take 1 tablet (5 mg total) by mouth daily.  Marland Kitchen atorvastatin (LIPITOR) 40 MG tablet TAKE 1 TABLET (40 MG TOTAL) BY MOUTH DAILY.  . folic acid (FOLVITE) 1 MG tablet   . gabapentin (NEURONTIN) 300 MG capsule   . olmesartan-hydrochlorothiazide (BENICAR HCT) 20-12.5 MG tablet Take 1 tablet by mouth daily.  . RABEprazole (ACIPHEX) 20 MG tablet   . Testosterone (ANDROGEL) 40.5 MG/2.5GM (1.62%) GEL Apply 1 application topically daily.  . traMADol (ULTRAM) 50 MG tablet Take 50 mg by mouth 2 (two) times daily.  . methotrexate 25 MG/ML injection Inject 50 mg/m2 as directed once a week.   . pantoprazole (PROTONIX) 40 MG tablet Take 1 tablet (40 mg total) by mouth 2 (two) times daily.  . [DISCONTINUED] albuterol (PROVENTIL HFA;VENTOLIN HFA) 108 (90 Base) MCG/ACT inhaler Inhale 2 puffs into the lungs every 6 (six) hours as needed for wheezing or shortness of breath.  . [DISCONTINUED] famotidine (PEPCID) 20 MG tablet Take 20 mg by mouth 2 (two) times daily.  . [DISCONTINUED] fluticasone (FLONASE) 50 MCG/ACT nasal spray Place into both nostrils daily.   No facility-administered encounter medications on file as of 05/09/2016.     1. Essential hypertension  Patient following previously prescribed medication regimen.  2. Aortic atherosclerosis ([place])    3. Gastroesophageal reflux disease without esophagitis  Patient taking protonix as prescribed.  4. Hypogonadism in male  Patient using androgel  5. Psoriatic arthritis [facility])  Patient following previously prescribed meds.  6. Benign prostatic hyperplasia with lower urinary tract symptoms, symptom details unspecified  Patient having no trouble voiding at this time.  7. Hyperlipidemia with  target LDL less than 100  Patient being managed with atorvastatin.    New complaints: None today.    Review of Systems  Constitutional: Negative.   HENT: Negative.   Respiratory: Negative.   Gastrointestinal: Negative.   Endocrine: Negative.   Genitourinary: Negative.   Neurological: Negative.        Objective:   Physical Exam  Constitutional: He is oriented to person, place, and time. He appears well-developed and well-nourished.  HENT:  Head: Normocephalic.  Mouth/Throat: Oropharynx is clear and moist.  Eyes: Pupils are equal, round, and reactive to light.  Neck: Normal range of motion. Neck supple.  Cardiovascular: Normal rate, regular rhythm and normal heart sounds.   Pulmonary/Chest: Effort normal and breath sounds normal.  Abdominal: Soft. Bowel sounds are normal.  Musculoskeletal: Normal range of motion.  Neurological: He is alert and oriented to person, place, and time.  Skin: Skin is warm and dry.  Psychiatric: He has a normal mood and affect. His behavior is normal. Judgment and thought content normal.   BP 119/63   Pulse 64   Temp 97.4 F (36.3 C) (Oral)   Ht '5\' 7"'  (1.702 m)   Wt 175 lb (79.4 kg)   BMI 27.41 kg/m       Assessment & Plan:   1. Essential hypertension Low salt diet. - CMP14+EGFR  2. Aortic atherosclerosis ([place]) Continue to follow current medication regimen.  3. Gastroesophageal reflux disease without esophagitis Continue Protonix as previously prescribed.  4. Hypogonadism in male - Testosterone (ANDROGEL) 40.5 MG/2.5GM (1.62%) GEL; Apply 1  application topically daily.  Dispense: 7.5 g; Refill: 2  5. Psoriatic arthritis ([place]) Follow-up with rheumatologist.  6. Benign prostatic hyperplasia with lower urinary tract symptoms, symptom details unspecified Continue taking previously prescribed meds.  No current problems.  7. Hyperlipidemia with target LDL less than 100 Low fat diet. - Lipid panel  8. Prostate cancer  screening - PSA, total and free    Labs pending Health maintenance reviewed Diet and exercise encouraged Continue all meds Follow up  In 3 months.   Marissa Hite, SNP Mary-Margaret Hassell Done, FNP

## 2016-05-09 NOTE — Patient Instructions (Signed)
Hypertension °Hypertension is another name for high blood pressure. High blood pressure forces your heart to work harder to pump blood. This can cause problems over time. °There are two numbers in a blood pressure reading. There is a top number (systolic) over a bottom number (diastolic). It is best to have a blood pressure below 120/80. Healthy choices can help lower your blood pressure. You may need medicine to help lower your blood pressure if: °· Your blood pressure cannot be lowered with healthy choices. °· Your blood pressure is higher than 130/80. °Follow these instructions at home: °Eating and drinking  °· If directed, follow the DASH eating plan. This diet includes: °¨ Filling half of your plate at each meal with fruits and vegetables. °¨ Filling one quarter of your plate at each meal with whole grains. Whole grains include whole wheat pasta, brown rice, and whole grain bread. °¨ Eating or drinking low-fat dairy products, such as skim milk or low-fat yogurt. °¨ Filling one quarter of your plate at each meal with low-fat (lean) proteins. Low-fat proteins include fish, skinless chicken, eggs, beans, and tofu. °¨ Avoiding fatty meat, cured and processed meat, or chicken with skin. °¨ Avoiding premade or processed food. °· Eat less than 1,500 mg of salt (sodium) a day. °· Limit alcohol use to no more than 1 drink a day for nonpregnant women and 2 drinks a day for men. One drink equals 12 oz of beer, 5 oz of wine, or 1½ oz of hard liquor. °Lifestyle  °· Work with your doctor to stay at a healthy weight or to lose weight. Ask your doctor what the best weight is for you. °· Get at least 30 minutes of exercise that causes your heart to beat faster (aerobic exercise) most days of the week. This may include walking, swimming, or biking. °· Get at least 30 minutes of exercise that strengthens your muscles (resistance exercise) at least 3 days a week. This may include lifting weights or pilates. °· Do not use any  products that contain nicotine or tobacco. This includes cigarettes and e-cigarettes. If you need help quitting, ask your doctor. °· Check your blood pressure at home as told by your doctor. °· Keep all follow-up visits as told by your doctor. This is important. °Medicines  °· Take over-the-counter and prescription medicines only as told by your doctor. Follow directions carefully. °· Do not skip doses of blood pressure medicine. The medicine does not work as well if you skip doses. Skipping doses also puts you at risk for problems. °· Ask your doctor about side effects or reactions to medicines that you should watch for. °Contact a doctor if: °· You think you are having a reaction to the medicine you are taking. °· You have headaches that keep coming back (recurring). °· You feel dizzy. °· You have swelling in your ankles. °· You have trouble with your vision. °Get help right away if: °· You get a very bad headache. °· You start to feel confused. °· You feel weak or numb. °· You feel faint. °· You get very bad pain in your: °¨ Chest. °¨ Belly (abdomen). °· You throw up (vomit) more than once. °· You have trouble breathing. °Summary °· Hypertension is another name for high blood pressure. °· Making healthy choices can help lower blood pressure. If your blood pressure cannot be controlled with healthy choices, you may need to take medicine. °This information is not intended to replace advice given to you by your   health care provider. Make sure you discuss any questions you have with your health care provider. °Document Released: 06/14/2007 Document Revised: 11/24/2015 Document Reviewed: 11/24/2015 °Elsevier Interactive Patient Education © 2017 [employer] ° °

## 2016-05-10 LAB — CMP14+EGFR
A/G RATIO: 1.7 (ref 1.2–2.2)
ALBUMIN: 4.1 g/dL (ref 3.5–4.8)
ALT: 23 IU/L (ref 0–44)
AST: 21 IU/L (ref 0–40)
Alkaline Phosphatase: 96 IU/L (ref 39–117)
BUN / CREAT RATIO: 16 (ref 10–24)
BUN: 16 mg/dL (ref 8–27)
Bilirubin Total: 0.6 mg/dL (ref 0.0–1.2)
CALCIUM: 10.3 mg/dL — AB (ref 8.6–10.2)
CO2: 25 mmol/L (ref 18–29)
CREATININE: 1.03 mg/dL (ref 0.76–1.27)
Chloride: 97 mmol/L (ref 96–106)
GFR calc non Af Amer: 72 mL/min/{1.73_m2} (ref >59)
GFR, EST AFRICAN AMERICAN: 83 mL/min/{1.73_m2} (ref >59)
GLOBULIN, TOTAL: 2.4 g/dL (ref 1.5–4.5)
Glucose: 78 mg/dL (ref 65–99)
Potassium: 4.1 mmol/L (ref 3.5–5.2)
SODIUM: 138 mmol/L (ref 134–144)
Total Protein: 6.5 g/dL (ref 6.0–8.5)

## 2016-05-10 LAB — LIPID PANEL
Chol/HDL Ratio: 2.4 ratio (ref 0.0–5.0)
Cholesterol, Total: 107 mg/dL (ref 100–199)
HDL: 45 mg/dL (ref >39)
LDL CALC: 49 mg/dL (ref 0–99)
TRIGLYCERIDES: 63 mg/dL (ref 0–149)
VLDL Cholesterol Cal: 13 mg/dL (ref 5–40)

## 2016-05-10 LAB — PSA, TOTAL AND FREE
PSA FREE PCT: 20.3 %
PSA FREE: 1.3 ng/mL
Prostate Specific Ag, Serum: 6.4 ng/mL — ABNORMAL HIGH (ref 0.0–4.0)

## 2016-05-16 ENCOUNTER — Other Ambulatory Visit: Payer: Self-pay | Admitting: Nurse Practitioner

## 2016-05-16 DIAGNOSIS — R972 Elevated prostate specific antigen [PSA]: Secondary | ICD-10-CM

## 2016-06-08 ENCOUNTER — Other Ambulatory Visit: Payer: Self-pay | Admitting: Nurse Practitioner

## 2016-06-08 DIAGNOSIS — I10 Essential (primary) hypertension: Secondary | ICD-10-CM

## 2016-06-13 DIAGNOSIS — R972 Elevated prostate specific antigen [PSA]: Secondary | ICD-10-CM | POA: Diagnosis not present

## 2016-06-13 DIAGNOSIS — N401 Enlarged prostate with lower urinary tract symptoms: Secondary | ICD-10-CM | POA: Diagnosis not present

## 2016-06-15 ENCOUNTER — Other Ambulatory Visit: Payer: Self-pay | Admitting: Nurse Practitioner

## 2016-06-15 DIAGNOSIS — I10 Essential (primary) hypertension: Secondary | ICD-10-CM

## 2016-07-20 ENCOUNTER — Other Ambulatory Visit: Payer: Medicare HMO

## 2016-07-20 DIAGNOSIS — L4059 Other psoriatic arthropathy: Secondary | ICD-10-CM | POA: Diagnosis not present

## 2016-07-30 ENCOUNTER — Other Ambulatory Visit: Payer: Self-pay | Admitting: Nurse Practitioner

## 2016-07-30 DIAGNOSIS — E785 Hyperlipidemia, unspecified: Secondary | ICD-10-CM

## 2016-08-07 ENCOUNTER — Other Ambulatory Visit: Payer: Self-pay | Admitting: Nurse Practitioner

## 2016-08-07 DIAGNOSIS — E785 Hyperlipidemia, unspecified: Secondary | ICD-10-CM

## 2016-08-17 ENCOUNTER — Encounter: Payer: Self-pay | Admitting: Nurse Practitioner

## 2016-08-17 ENCOUNTER — Ambulatory Visit (INDEPENDENT_AMBULATORY_CARE_PROVIDER_SITE_OTHER): Payer: Medicare HMO | Admitting: Nurse Practitioner

## 2016-08-17 VITALS — BP 115/60 | HR 62 | Temp 97.0°F | Ht 67.0 in | Wt 172.0 lb

## 2016-08-17 DIAGNOSIS — L4050 Arthropathic psoriasis, unspecified: Secondary | ICD-10-CM | POA: Diagnosis not present

## 2016-08-17 DIAGNOSIS — K219 Gastro-esophageal reflux disease without esophagitis: Secondary | ICD-10-CM

## 2016-08-17 DIAGNOSIS — F5101 Primary insomnia: Secondary | ICD-10-CM

## 2016-08-17 DIAGNOSIS — Z125 Encounter for screening for malignant neoplasm of prostate: Secondary | ICD-10-CM

## 2016-08-17 DIAGNOSIS — J4520 Mild intermittent asthma, uncomplicated: Secondary | ICD-10-CM | POA: Diagnosis not present

## 2016-08-17 DIAGNOSIS — F325 Major depressive disorder, single episode, in full remission: Secondary | ICD-10-CM

## 2016-08-17 DIAGNOSIS — I10 Essential (primary) hypertension: Secondary | ICD-10-CM

## 2016-08-17 DIAGNOSIS — E785 Hyperlipidemia, unspecified: Secondary | ICD-10-CM

## 2016-08-17 DIAGNOSIS — I700 Atherosclerosis of aorta: Secondary | ICD-10-CM

## 2016-08-17 DIAGNOSIS — N401 Enlarged prostate with lower urinary tract symptoms: Secondary | ICD-10-CM | POA: Diagnosis not present

## 2016-08-17 DIAGNOSIS — E291 Testicular hypofunction: Secondary | ICD-10-CM

## 2016-08-17 MED ORDER — ESCITALOPRAM OXALATE 10 MG PO TABS: 10.0000 mg | ORAL_TABLET | Freq: Every day | ORAL | 5 refills | Status: DC

## 2016-08-17 MED ORDER — TRAZODONE HCL 50 MG PO TABS: 25.0000 mg | ORAL_TABLET | Freq: Every evening | ORAL | 3 refills | Status: DC | PRN

## 2016-08-17 MED ORDER — ATORVASTATIN CALCIUM 40 MG PO TABS: 40.0000 mg | ORAL_TABLET | Freq: Every evening | ORAL | 1 refills | Status: DC

## 2016-08-17 MED ORDER — SUCRALFATE 1 G PO TABS
1.0000 g | ORAL_TABLET | Freq: Three times a day (TID) | ORAL | 5 refills | Status: DC
Start: 2016-08-17 — End: 2016-12-25

## 2016-08-17 MED ORDER — TESTOSTERONE 40.5 MG/2.5GM (1.62%) TD GEL: 1.0000 "application " | Freq: Every day | TRANSDERMAL | 2 refills | Status: DC

## 2016-08-17 NOTE — Progress Notes (Signed)
Subjective:    Patient ID: Rodney Hernandez, male    DOB: 1945/04/13, 71 y.o.   MRN: [id number]  HPI Eugine Bubb is here today for follow up of chronic medical problem.  Outpatient Encounter Prescriptions as of 08/17/2016  Medication Sig  . amLODipine (NORVASC) 5 MG tablet TAKE 1 TABLET (5 MG TOTAL) BY MOUTH DAILY.  Marland Kitchen atorvastatin (LIPITOR) 40 MG tablet TAKE 1 TABLET EVERY EVENING  . atorvastatin (LIPITOR) 40 MG tablet TAKE 1 TABLET EVERY EVENING  . folic acid (FOLVITE) 1 MG tablet   . gabapentin (NEURONTIN) 300 MG capsule   . methotrexate 25 MG/ML injection Inject 50 mg/m2 as directed once a week.   . olmesartan-hydrochlorothiazide (BENICAR HCT) 20-12.5 MG tablet TAKE 1 TABLET BY MOUTH DAILY.  . pantoprazole (PROTONIX) 40 MG tablet Take 1 tablet (40 mg total) by mouth 2 (two) times daily.  . RABEprazole (ACIPHEX) 20 MG tablet   . Testosterone (ANDROGEL) 40.5 MG/2.5GM (1.62%) GEL Apply 1 application topically daily.  . traMADol (ULTRAM) 50 MG tablet Take 50 mg by mouth 2 (two) times daily.   No facility-administered encounter medications on file as of 08/17/2016.     1. Essential hypertension  Patient follows previously prescribed medication regimen.  Does not check BP at home.  2. Aortic atherosclerosis ([place])   3. Mild intermittent chronic asthma without complication  Patient does not take medication for management.  No SOB.  4. Gastroesophageal reflux disease without esophagitis  Taking Aciphex as prescribed.  Patient has been taking carafate left over from previous prescription.  He says this is helping tremendously and would like a rx.  5. Psoriatic arthritis ([place])  Follows previously prescribed medications.  6. Benign prostatic hyperplasia with lower urinary tract symptoms, symptom details unspecified  No trouble voiding at this time.  7. Hyperlipidemia with target LDL less than 100  Managed with atorvastatin.  LFTs monitored regularly.    New complaints: -insomnia- says that he  is having trouble staying asleep- wakes up around 2am and will stay up until bedtime the next day- he has tried [place] in the past which did not help - depressed- has been this way since his wife died- he is not on anything and thinks it may be time  To start something Depression screen [facility] 2/9 08/17/2016 05/09/2016 12/21/2015 08/30/2015 07/20/2015  Decreased Interest 1 0 0 0 1  Down, Depressed, Hopeless 1 0 0 0 0  PHQ - 2 Score 2 0 0 0 1  Altered sleeping 2 - - - -  Tired, decreased energy 2 - - - -  Change in appetite 2 - - - -  Feeling bad or failure about yourself  2 - - - -  Trouble concentrating 0 - - - -  Moving slowly or fidgety/restless 0 - - - -  Suicidal thoughts 0 - - - -  PHQ-9 Score 10 - - - -  Difficult doing work/chores Somewhat difficult - - - -      Social history: Wife passed away- lives alone- sees daughter almost everyday   Review of Systems  Constitutional: Negative for activity change and appetite change.  Respiratory: Negative for shortness of breath and wheezing.   Cardiovascular: Negative for chest pain and palpitations.  Gastrointestinal: Negative for abdominal pain.  Musculoskeletal: Positive for arthralgias (chronic).  Psychiatric/Behavioral: Positive for dysphoric mood (patient attributes this to lack of sleep and arthritis flare ups).  All other systems reviewed and are negative.      Objective:  Physical Exam  Constitutional: He is oriented to person, place, and time. He appears well-developed and well-nourished.  HENT:  Head: Normocephalic.  Right Ear: External ear normal.  Left Ear: External ear normal.  Mouth/Throat: Oropharynx is clear and moist.  Eyes: Pupils are equal, round, and reactive to light.  Neck: Normal range of motion. Neck supple. No thyromegaly present.  Cardiovascular: Normal rate, regular rhythm, normal heart sounds and intact distal pulses.   No murmur heard. Pulmonary/Chest: Effort normal and breath sounds normal. No  respiratory distress. He has no wheezes.  Abdominal: Soft. Bowel sounds are normal. He exhibits no distension. There is no tenderness.  Musculoskeletal: Normal range of motion. He exhibits no edema.  Lymphadenopathy:    He has no cervical adenopathy.  Neurological: He is alert and oriented to person, place, and time.  Skin: Skin is warm and dry.  Psychiatric: He has a normal mood and affect.   BP 115/60   Pulse 62   Temp (!) 97 F (36.1 C) (Oral)   Ht _0  (1.702 m)   Wt 172 lb (78 kg)   BMI 26.94 kg/m       Assessment & Plan:  1. Essential hypertension Low sodium diet - CMP14+EGFR  2. Aortic atherosclerosis ([place])  3. Mild intermittent chronic asthma without complication  4. Gastroesophageal reflux disease without esophagitis Avoid spicy foods Do not eat 2 hours prior to bedtime - sucralfate (CARAFATE) 1 g tablet; Take 1 tablet (1 g total) by mouth 4 (four) times daily -  with meals and at bedtime.  Dispense: 90 tablet; Refill: 5  5. Psoriatic arthritis ([place]) Keep follow up with rheumatologist  6. Benign prostatic hyperplasia with lower urinary tract symptoms, symptom details unspecified  7. Hyperlipidemia with target LDL less than 100 Low fat diet - atorvastatin (LIPITOR) 40 MG tablet; Take 1 tablet (40 mg total) by mouth every evening.  Dispense: 90 tablet; Refill: 1 - Lipid panel  8. Hypogonadism in male - Testosterone (ANDROGEL) 40.5 MG/2.5GM (1.62%) GEL; Apply 1 application topically daily.  Dispense: 7.5 g; Refill: 2  9. Screening for prostate cancer - PSA, total and free  10. Depression, major, single episode, complete remission (HCC) Stress management - escitalopram (LEXAPRO) 10 MG tablet; Take 1 tablet (10 mg total) by mouth daily.  Dispense: 30 tablet; Refill: 5  11. Insomnia trazadone 41m 1 po qhs #30 5 refills  Labs pending Health maintenance reviewed Diet and exercise encouraged Continue all meds Follow up  In 3 months   [place] FNP

## 2016-08-17 NOTE — Patient Instructions (Signed)

## 2016-08-18 LAB — CMP14+EGFR
ALBUMIN: 4.6 g/dL (ref 3.5–4.8)
ALT: 22 IU/L (ref 0–44)
AST: 20 IU/L (ref 0–40)
Albumin/Globulin Ratio: 2.2 (ref 1.2–2.2)
Alkaline Phosphatase: 101 IU/L (ref 39–117)
BUN / CREAT RATIO: 15 (ref 10–24)
BUN: 16 mg/dL (ref 8–27)
Bilirubin Total: 0.7 mg/dL (ref 0.0–1.2)
CALCIUM: 10.8 mg/dL — AB (ref 8.6–10.2)
CO2: 26 mmol/L (ref 20–29)
CREATININE: 1.04 mg/dL (ref 0.76–1.27)
Chloride: 100 mmol/L (ref 96–106)
GFR calc Af Amer: 82 mL/min/{1.73_m2} (ref >59)
GFR, EST NON AFRICAN AMERICAN: 71 mL/min/{1.73_m2} (ref >59)
GLOBULIN, TOTAL: 2.1 g/dL (ref 1.5–4.5)
Glucose: 123 mg/dL — ABNORMAL HIGH (ref 65–99)
Potassium: 3.9 mmol/L (ref 3.5–5.2)
SODIUM: 139 mmol/L (ref 134–144)
Total Protein: 6.7 g/dL (ref 6.0–8.5)

## 2016-08-18 LAB — LIPID PANEL
CHOL/HDL RATIO: 2.4 ratio (ref 0.0–5.0)
Cholesterol, Total: 106 mg/dL (ref 100–199)
HDL: 44 mg/dL (ref >39)
LDL CALC: 39 mg/dL (ref 0–99)
Triglycerides: 114 mg/dL (ref 0–149)
VLDL Cholesterol Cal: 23 mg/dL (ref 5–40)

## 2016-08-18 LAB — PSA, TOTAL AND FREE
PROSTATE SPECIFIC AG, SERUM: 6.3 ng/mL — AB (ref 0.0–4.0)
PSA FREE PCT: 21.3 %
PSA FREE: 1.34 ng/mL

## 2016-08-21 ENCOUNTER — Telehealth: Payer: Self-pay | Admitting: Nurse Practitioner

## 2016-08-21 MED ORDER — PANTOPRAZOLE SODIUM 40 MG PO TBEC: 40.0000 mg | DELAYED_RELEASE_TABLET | Freq: Two times a day (BID) | ORAL | 1 refills | Status: DC

## 2016-08-21 NOTE — Telephone Encounter (Signed)
Patient aware that medication has been sent to pharmacy 

## 2016-08-22 DIAGNOSIS — D485 Neoplasm of uncertain behavior of skin: Secondary | ICD-10-CM | POA: Diagnosis not present

## 2016-08-22 DIAGNOSIS — L570 Actinic keratosis: Secondary | ICD-10-CM | POA: Diagnosis not present

## 2016-08-22 DIAGNOSIS — Z85828 Personal history of other malignant neoplasm of skin: Secondary | ICD-10-CM | POA: Diagnosis not present

## 2016-08-24 ENCOUNTER — Encounter: Payer: Self-pay | Admitting: Nurse Practitioner

## 2016-08-24 ENCOUNTER — Ambulatory Visit (INDEPENDENT_AMBULATORY_CARE_PROVIDER_SITE_OTHER): Payer: Medicare HMO | Admitting: Nurse Practitioner

## 2016-08-24 VITALS — BP 134/72 | HR 77 | Temp 97.5°F | Ht 67.0 in | Wt 173.6 lb

## 2016-08-24 DIAGNOSIS — R609 Edema, unspecified: Secondary | ICD-10-CM | POA: Diagnosis not present

## 2016-08-24 DIAGNOSIS — I10 Essential (primary) hypertension: Secondary | ICD-10-CM | POA: Diagnosis not present

## 2016-08-24 MED ORDER — FUROSEMIDE 20 MG PO TABS: 20.0000 mg | ORAL_TABLET | Freq: Every day | ORAL | 3 refills | Status: DC

## 2016-08-24 MED ORDER — OLMESARTAN MEDOXOMIL 20 MG PO TABS: 20.0000 mg | ORAL_TABLET | Freq: Every day | ORAL | 5 refills | Status: DC

## 2016-08-24 NOTE — Progress Notes (Signed)
   Subjective:    Patient ID: Rodney Hernandez, male    DOB: 09/06/1945, 71 y.o.   MRN: [id number]  HPI Patient comes in today with his daughter c/o bil lower ext swelling. He says it comes and goes but for the last 3 days it will not go away. Worse in feet then legs.    Review of Systems  Constitutional: Negative.   HENT: Negative.   Respiratory: Negative.  Negative for shortness of breath.   Cardiovascular: Positive for leg swelling.  Genitourinary: Negative.   Neurological: Negative.   Psychiatric/Behavioral: Negative.   All other systems reviewed and are negative.      Objective:   Physical Exam  Constitutional: He is oriented to person, place, and time. He appears well-developed and well-nourished. No distress.  HENT:  Right Ear: External ear normal.  Left Ear: External ear normal.  Nose: Nose normal.  Mouth/Throat: Oropharynx is clear and moist.  Cardiovascular: Normal rate.   Pulmonary/Chest: Effort normal and breath sounds normal.  Musculoskeletal: He exhibits edema (1+ pitting edema bil loower ext- worse in feet).  Neurological: He is alert and oriented to person, place, and time.  Skin: Skin is warm.  Psychiatric: He has a normal mood and affect. His behavior is normal. Judgment and thought content normal.   BP 134/72   Pulse 77   Temp (!) 97.5 F (36.4 C) (Oral)   Ht 5\' 7"  (1.702 m)   Wt 173 lb 9.6 oz (78.7 kg)   BMI 27.19 kg/m       Assessment & Plan:  1. Essential hypertension Low sodium diet - olmesartan (BENICAR) 20 MG tablet; Take 1 tablet (20 mg total) by mouth daily.  Dispense: 30 tablet; Refill: 5  2. Peripheral edema Elevate legs when sitting - furosemide (LASIX) 20 MG tablet; Take 1 tablet (20 mg total) by mouth daily.  Dispense: 30 tablet; Refill: [place], FNP

## 2016-08-24 NOTE — Patient Instructions (Signed)

## 2016-10-04 ENCOUNTER — Ambulatory Visit: Payer: Medicare HMO

## 2016-10-08 ENCOUNTER — Other Ambulatory Visit: Payer: Self-pay | Admitting: Nurse Practitioner

## 2016-10-08 DIAGNOSIS — I10 Essential (primary) hypertension: Secondary | ICD-10-CM

## 2016-10-11 ENCOUNTER — Ambulatory Visit (INDEPENDENT_AMBULATORY_CARE_PROVIDER_SITE_OTHER): Payer: Medicare HMO

## 2016-10-11 DIAGNOSIS — Z23 Encounter for immunization: Secondary | ICD-10-CM | POA: Diagnosis not present

## 2016-11-20 ENCOUNTER — Ambulatory Visit: Payer: Medicare HMO | Admitting: Nurse Practitioner

## 2016-11-21 ENCOUNTER — Ambulatory Visit: Payer: Medicare HMO | Admitting: Nurse Practitioner

## 2016-11-21 ENCOUNTER — Encounter: Payer: Self-pay | Admitting: Nurse Practitioner

## 2016-11-21 VITALS — BP 145/71 | HR 59 | Temp 97.1°F | Ht 67.0 in | Wt 177.0 lb

## 2016-11-21 DIAGNOSIS — L4050 Arthropathic psoriasis, unspecified: Secondary | ICD-10-CM | POA: Diagnosis not present

## 2016-11-21 DIAGNOSIS — R609 Edema, unspecified: Secondary | ICD-10-CM | POA: Diagnosis not present

## 2016-11-21 DIAGNOSIS — R600 Localized edema: Secondary | ICD-10-CM

## 2016-11-21 DIAGNOSIS — E785 Hyperlipidemia, unspecified: Secondary | ICD-10-CM

## 2016-11-21 DIAGNOSIS — J4520 Mild intermittent asthma, uncomplicated: Secondary | ICD-10-CM

## 2016-11-21 DIAGNOSIS — F5101 Primary insomnia: Secondary | ICD-10-CM

## 2016-11-21 DIAGNOSIS — K219 Gastro-esophageal reflux disease without esophagitis: Secondary | ICD-10-CM | POA: Diagnosis not present

## 2016-11-21 DIAGNOSIS — I10 Essential (primary) hypertension: Secondary | ICD-10-CM | POA: Diagnosis not present

## 2016-11-21 DIAGNOSIS — I700 Atherosclerosis of aorta: Secondary | ICD-10-CM | POA: Diagnosis not present

## 2016-11-21 DIAGNOSIS — N401 Enlarged prostate with lower urinary tract symptoms: Secondary | ICD-10-CM | POA: Diagnosis not present

## 2016-11-21 DIAGNOSIS — F321 Major depressive disorder, single episode, moderate: Secondary | ICD-10-CM | POA: Diagnosis not present

## 2016-11-21 MED ORDER — ESCITALOPRAM OXALATE 20 MG PO TABS: 20.0000 mg | ORAL_TABLET | Freq: Every day | ORAL | 5 refills | Status: DC

## 2016-11-21 MED ORDER — MIRTAZAPINE 30 MG PO TABS: 30.0000 mg | ORAL_TABLET | Freq: Every day | ORAL | 5 refills | Status: DC

## 2016-11-21 MED ORDER — FUROSEMIDE 20 MG PO TABS: 20.0000 mg | ORAL_TABLET | Freq: Every day | ORAL | 1 refills | Status: DC

## 2016-11-21 MED ORDER — AMLODIPINE BESYLATE 5 MG PO TABS: 5.0000 mg | ORAL_TABLET | Freq: Every day | ORAL | 1 refills | Status: DC

## 2016-11-21 NOTE — Patient Instructions (Signed)

## 2016-11-21 NOTE — Progress Notes (Signed)
Subjective:    Patient ID: Rodney Hernandez, male    DOB: 08-10-1945, 71 y.o.   MRN: [id number]  HPI  Rodney Hernandez is here today for follow up of chronic medical problem.  Outpatient Encounter Medications as of 11/21/2016  Medication Sig  . amLODipine (NORVASC) 5 MG tablet TAKE 1 TABLET BY MOUTH EVERY DAY  . atorvastatin (LIPITOR) 40 MG tablet Take 1 tablet (40 mg total) by mouth every evening.  . escitalopram (LEXAPRO) 10 MG tablet Take 1 tablet (10 mg total) by mouth daily.  . folic acid (FOLVITE) 1 MG tablet   . furosemide (LASIX) 20 MG tablet Take 1 tablet (20 mg total) by mouth daily.  . methotrexate 25 MG/ML injection Inject 50 mg/m2 as directed once a week.   . olmesartan (BENICAR) 20 MG tablet Take 1 tablet (20 mg total) by mouth daily.  . sucralfate (CARAFATE) 1 g tablet Take 1 tablet (1 g total) by mouth 4 (four) times daily -  with meals and at bedtime.  . Testosterone (ANDROGEL) 40.5 MG/2.5GM (1.62%) GEL Apply 1 application topically daily.  . traMADol (ULTRAM) 50 MG tablet Take 50 mg by mouth 2 (two) times daily.  . pantoprazole (PROTONIX) 40 MG tablet Take 1 tablet (40 mg total) by mouth 2 (two) times daily.  . RABEprazole (ACIPHEX) 20 MG tablet   . traZODone (DESYREL) 50 MG tablet Take 0.5-1 tablets (25-50 mg total) by mouth at bedtime as needed for sleep.   No facility-administered encounter medications on file as of 11/21/2016.     1. Essential hypertension  No c/o chest pain, sob or headache. Does not check blood pressure at home.  BP Readings from Last 3 Encounters:  11/21/16 (!) 145/71  08/24/16 134/72  08/17/16 115/60     2. Aortic atherosclerosis ([place])  Showed up on chest xray- patient has no concerns  3. Mild intermittent chronic asthma without complication  No problems recently. Needs refill on ventolin.  4. Gastroesophageal reflux disease without esophagitis  Has seen gi and they currently have him on aciphex and protonix. Works well.  5. Psoriatic  arthritis [facility])  Sees rheumatologist and he has him methotrexate which seems to help. Having lotrs of pain. Ultram upsets his stomach and rheumatologist does not want to give him anything else.  6. Benign prostatic hyperplasia with lower urinary tract symptoms, symptom details unspecified  No problems voiding. Last saw urologist in June 2018  7. Hyperlipidemia with target LDL less than 100  Not watching diet  8. Peripheral edema  Lasix keeps it from accumilating.  9.      insomnia          Cannot take trazadone, so has not been sleeping well. 10.    Depression          lexapro does not seem to be making any difference. He says he is nit as sad as          he use to be. New complaints: Only complaint is not being able to sleep.  Social history: Lives alone but his daughters come and see him often.   Review of Systems  Constitutional: Negative for activity change and appetite change.  HENT: Negative.   Eyes: Negative for pain.  Respiratory: Negative for shortness of breath.   Cardiovascular: Negative for chest pain, palpitations and leg swelling.  Gastrointestinal: Negative for abdominal pain.  Endocrine: Negative for polydipsia.  Genitourinary: Negative.   Skin: Negative for rash.  Neurological: Negative for dizziness, weakness  and headaches.  Hematological: Does not bruise/bleed easily.  Psychiatric/Behavioral: Negative.   All other systems reviewed and are negative.      Objective:   Physical Exam  Constitutional: He is oriented to person, place, and time. He appears well-developed and well-nourished.  HENT:  Head: Normocephalic.  Right Ear: External ear normal.  Left Ear: External ear normal.  Nose: Nose normal.  Mouth/Throat: Oropharynx is clear and moist.  Eyes: EOM are normal. Pupils are equal, round, and reactive to light.  Neck: Normal range of motion. Neck supple. No JVD present. No thyromegaly present.  Cardiovascular: Normal rate, regular rhythm, normal heart  sounds and intact distal pulses. Exam reveals no gallop and no friction rub.  No murmur heard. Pulmonary/Chest: Effort normal and breath sounds normal. No respiratory distress. He has no wheezes. He has no rales. He exhibits no tenderness.  Abdominal: Soft. Bowel sounds are normal. He exhibits no mass. There is no tenderness.  Genitourinary: Prostate normal and penis normal.  Musculoskeletal: Normal range of motion. He exhibits no edema.  Lymphadenopathy:    He has no cervical adenopathy.  Neurological: He is alert and oriented to person, place, and time. No cranial nerve deficit.  Skin: Skin is warm and dry.  Psychiatric: He has a normal mood and affect. His behavior is normal. Judgment and thought content normal.   BP (!) 145/71   Pulse (!) 59   Temp (!) 97.1 F (36.2 C) (Oral)   Ht 5' 7" (1.702 m)   Wt 177 lb (80.3 kg)   BMI 27.72 kg/m       Assessment & Plan:  1. Essential hypertension Low sodium diet - amLODipine (NORVASC) 5 MG tablet; Take 1 tablet (5 mg total) daily by mouth.  Dispense: 90 tablet; Refill: 1 - CMP14+EGFR  2. Aortic atherosclerosis ([place])  3. Mild intermittent chronic asthma without complication  4. Gastroesophageal reflux disease without esophagitis Avoid spicy foods Do not eat 2 hours prior to bedtime  5. Psoriatic arthritis ([place]) Keep followup with rheumatologist Discuss pain medds with them  6. Benign prostatic hyperplasia with lower urinary tract symptoms, symptom details unspecified Keep follow up appointment with urology  7. Hyperlipidemia with target LDL less than 100 Low fat diet - Lipid panel  8. Peripheral edema Elevate legs when sitting - furosemide (LASIX) 20 MG tablet; Take 1 tablet (20 mg total) daily by mouth.  Dispense: 90 tablet; Refill: 1  9. Depression, major, single episode, moderate (HCC) increased lexapro form 42m -225m- escitalopram (LEXAPRO) 20 MG tablet; Take 1 tablet (20 mg total) daily by mouth.  Dispense: 30  tablet; Refill: 5  10. Primary insomnia Bedtime routine - mirtazapine (REMERON) 30 MG tablet; Take 1 tablet (30 mg total) at bedtime by mouth.  Dispense: 30 tablet; Refill: 5    Labs pending Health maintenance reviewed Diet and exercise encouraged Continue all meds Follow up  In 3 months   MaVaughnFNP

## 2016-11-22 LAB — CMP14+EGFR
A/G RATIO: 2 (ref 1.2–2.2)
ALT: 25 IU/L (ref 0–44)
AST: 19 IU/L (ref 0–40)
Albumin: 4.5 g/dL (ref 3.5–4.8)
Alkaline Phosphatase: 117 IU/L (ref 39–117)
BUN/Creatinine Ratio: 9 — ABNORMAL LOW (ref 10–24)
BUN: 10 mg/dL (ref 8–27)
Bilirubin Total: 0.4 mg/dL (ref 0.0–1.2)
CO2: 24 mmol/L (ref 20–29)
Calcium: 10.2 mg/dL (ref 8.6–10.2)
Chloride: 103 mmol/L (ref 96–106)
Creatinine, Ser: 1.11 mg/dL (ref 0.76–1.27)
GFR, EST AFRICAN AMERICAN: 76 mL/min/{1.73_m2} (ref >59)
GFR, EST NON AFRICAN AMERICAN: 66 mL/min/{1.73_m2} (ref >59)
GLOBULIN, TOTAL: 2.3 g/dL (ref 1.5–4.5)
Glucose: 83 mg/dL (ref 65–99)
POTASSIUM: 4.2 mmol/L (ref 3.5–5.2)
Sodium: 142 mmol/L (ref 134–144)
TOTAL PROTEIN: 6.8 g/dL (ref 6.0–8.5)

## 2016-11-22 LAB — LIPID PANEL
CHOL/HDL RATIO: 2.2 ratio (ref 0.0–5.0)
Cholesterol, Total: 114 mg/dL (ref 100–199)
HDL: 53 mg/dL (ref >39)
LDL CALC: 49 mg/dL (ref 0–99)
Triglycerides: 59 mg/dL (ref 0–149)
VLDL Cholesterol Cal: 12 mg/dL (ref 5–40)

## 2016-11-23 ENCOUNTER — Other Ambulatory Visit: Payer: Self-pay | Admitting: *Deleted

## 2016-11-23 DIAGNOSIS — F5101 Primary insomnia: Secondary | ICD-10-CM

## 2016-11-23 NOTE — Telephone Encounter (Signed)
patinet said he did not want to take anymore- please call and ask

## 2016-12-04 ENCOUNTER — Telehealth: Payer: Self-pay | Admitting: Nurse Practitioner

## 2016-12-04 ENCOUNTER — Other Ambulatory Visit: Payer: Self-pay

## 2016-12-04 DIAGNOSIS — E291 Testicular hypofunction: Secondary | ICD-10-CM

## 2016-12-04 MED ORDER — TESTOSTERONE 40.5 MG/2.5GM (1.62%) TD GEL: 1.0000 "application " | Freq: Every day | TRANSDERMAL | 2 refills | Status: DC

## 2016-12-04 MED ORDER — ALBUTEROL SULFATE HFA 108 (90 BASE) MCG/ACT IN AERS: INHALATION_SPRAY | RESPIRATORY_TRACT | 2 refills | Status: DC

## 2016-12-04 NOTE — Telephone Encounter (Signed)
Testosterone (ANDROGEL) 40.5 MG/2.5GM (1.62%) GEL   Needs it written will pick up tomorrow let patient know when ready. Also needs Ventolin HFA refilled [facility].

## 2016-12-05 ENCOUNTER — Other Ambulatory Visit: Payer: Self-pay | Admitting: *Deleted

## 2016-12-05 DIAGNOSIS — F321 Major depressive disorder, single episode, moderate: Secondary | ICD-10-CM

## 2016-12-05 MED ORDER — ESCITALOPRAM OXALATE 20 MG PO TABS: 20.0000 mg | ORAL_TABLET | Freq: Every day | ORAL | 1 refills | Status: DC

## 2016-12-08 NOTE — Telephone Encounter (Signed)
TC to patient he does not take this medication anymore

## 2016-12-13 ENCOUNTER — Other Ambulatory Visit: Payer: Self-pay | Admitting: *Deleted

## 2016-12-13 DIAGNOSIS — I10 Essential (primary) hypertension: Secondary | ICD-10-CM

## 2016-12-13 MED ORDER — OLMESARTAN MEDOXOMIL 20 MG PO TABS: 20.0000 mg | ORAL_TABLET | Freq: Every day | ORAL | 0 refills | Status: DC

## 2016-12-25 ENCOUNTER — Other Ambulatory Visit: Payer: Self-pay | Admitting: Nurse Practitioner

## 2016-12-25 DIAGNOSIS — M170 Bilateral primary osteoarthritis of knee: Secondary | ICD-10-CM | POA: Diagnosis not present

## 2016-12-25 DIAGNOSIS — M25561 Pain in right knee: Secondary | ICD-10-CM | POA: Diagnosis not present

## 2016-12-25 DIAGNOSIS — M2550 Pain in unspecified joint: Secondary | ICD-10-CM | POA: Diagnosis not present

## 2016-12-25 DIAGNOSIS — K219 Gastro-esophageal reflux disease without esophagitis: Secondary | ICD-10-CM

## 2016-12-25 DIAGNOSIS — M25562 Pain in left knee: Secondary | ICD-10-CM | POA: Diagnosis not present

## 2016-12-25 DIAGNOSIS — E663 Overweight: Secondary | ICD-10-CM | POA: Diagnosis not present

## 2016-12-25 DIAGNOSIS — Z6826 Body mass index (BMI) 26.0-26.9, adult: Secondary | ICD-10-CM | POA: Diagnosis not present

## 2016-12-25 DIAGNOSIS — Z79899 Other long term (current) drug therapy: Secondary | ICD-10-CM | POA: Diagnosis not present

## 2016-12-25 DIAGNOSIS — M150 Primary generalized (osteo)arthritis: Secondary | ICD-10-CM | POA: Diagnosis not present

## 2016-12-25 DIAGNOSIS — L4059 Other psoriatic arthropathy: Secondary | ICD-10-CM | POA: Diagnosis not present

## 2017-01-12 ENCOUNTER — Other Ambulatory Visit: Payer: Self-pay

## 2017-01-12 DIAGNOSIS — F5101 Primary insomnia: Secondary | ICD-10-CM

## 2017-01-12 MED ORDER — MIRTAZAPINE 30 MG PO TABS: 30.0000 mg | ORAL_TABLET | Freq: Every day | ORAL | 1 refills | Status: DC

## 2017-01-30 ENCOUNTER — Ambulatory Visit (INDEPENDENT_AMBULATORY_CARE_PROVIDER_SITE_OTHER): Payer: Medicare HMO | Admitting: Nurse Practitioner

## 2017-01-30 ENCOUNTER — Telehealth: Payer: Self-pay | Admitting: Nurse Practitioner

## 2017-01-30 ENCOUNTER — Encounter: Payer: Self-pay | Admitting: Nurse Practitioner

## 2017-01-30 VITALS — BP 138/72 | HR 66 | Temp 96.8°F | Ht 67.0 in | Wt 186.0 lb

## 2017-01-30 DIAGNOSIS — I10 Essential (primary) hypertension: Secondary | ICD-10-CM

## 2017-01-30 DIAGNOSIS — R609 Edema, unspecified: Secondary | ICD-10-CM

## 2017-01-30 MED ORDER — OLMESARTAN MEDOXOMIL 20 MG PO TABS: 40.0000 mg | ORAL_TABLET | Freq: Every day | ORAL | 2 refills | Status: DC

## 2017-01-30 NOTE — Telephone Encounter (Signed)
appt scheduled for evaluation 

## 2017-01-30 NOTE — Patient Instructions (Signed)

## 2017-01-30 NOTE — Progress Notes (Signed)
   Subjective:    Patient ID: Rodney Hernandez, male    DOB: [birth year], 72 y.o.   MRN: [id number]  HPI Patient brought in today with patient c/o swelling/ fluid retention. Started several weeks ago. He is on lasix daily. He was started on amlodipine for about 1 year. He is also on remeron to sleep which causes fluid retention.    Review of Systems  Constitutional: Negative.   Respiratory: Negative.   Cardiovascular: Positive for leg swelling.  Gastrointestinal: Negative.   Neurological: Negative.   Psychiatric/Behavioral: Negative.   All other systems reviewed and are negative.      Objective:   Physical Exam  Constitutional: He is oriented to person, place, and time. He appears well-developed and well-nourished. No distress.  Cardiovascular: Normal rate and regular rhythm.  Pulmonary/Chest: Effort normal and breath sounds normal. He has no wheezes. He has no rales.  Musculoskeletal: He exhibits edema (1+ edema bil lower ext).  Neurological: He is alert and oriented to person, place, and time.  Skin: Skin is warm.  Psychiatric: He has a normal mood and affect. His behavior is normal. Judgment and thought content normal.   BP 138/72   Pulse 66   Temp (!) 96.8 F (36 C) (Oral)   Ht 5\' 7"  (1.702 m)   Wt 186 lb (84.4 kg)   BMI 29.13 kg/m       Assessment & Plan:   1. Essential hypertension   2. Peripheral edema    Increased benicar to 40mg  daily Stop norvasc Continue lasix Call me in 2 days if not helping- will stop remeron next to see if that is causing edema.  Mary-Margaret Hassell Done, FNP

## 2017-02-02 ENCOUNTER — Telehealth: Payer: Self-pay | Admitting: Nurse Practitioner

## 2017-02-02 DIAGNOSIS — F321 Major depressive disorder, single episode, moderate: Secondary | ICD-10-CM

## 2017-02-05 ENCOUNTER — Other Ambulatory Visit: Payer: Self-pay | Admitting: Nurse Practitioner

## 2017-02-05 DIAGNOSIS — F325 Major depressive disorder, single episode, in full remission: Secondary | ICD-10-CM

## 2017-02-06 MED ORDER — ESCITALOPRAM OXALATE 20 MG PO TABS: 20.0000 mg | ORAL_TABLET | Freq: Every day | ORAL | 1 refills | Status: DC

## 2017-02-06 NOTE — Telephone Encounter (Signed)
Patient is correct per MMM's office visit notes, patient was to increase Benicar from 20 mg to 40, and increase his Lexapro from 10 mg to 20 mg.  MMM sent in a prescription correctly for his Benicar 20 mg with instructions to take 2 daily.  She incorrectly sent the Lexapro 10 mg, one daily.  I am sending in a new script for the 20 mg Lexapro and will contact the pharmacy to disregard the earlier script for 10 mg.

## 2017-02-06 NOTE — Telephone Encounter (Signed)
Pt states that mmm was going to double dosage on olmesartan (BENICAR) 20 MG tablet and lexapro. It has not been done. [facility]

## 2017-02-14 ENCOUNTER — Other Ambulatory Visit: Payer: Self-pay | Admitting: *Deleted

## 2017-02-14 MED ORDER — PANTOPRAZOLE SODIUM 40 MG PO TBEC: 40.0000 mg | DELAYED_RELEASE_TABLET | Freq: Two times a day (BID) | ORAL | 1 refills | Status: DC

## 2017-02-20 ENCOUNTER — Telehealth: Payer: Self-pay | Admitting: Nurse Practitioner

## 2017-02-20 NOTE — Telephone Encounter (Signed)
Please advise 

## 2017-02-21 NOTE — Telephone Encounter (Signed)
Does it  Not work at all cause can go up to 1 1/2 tablets Have we tried belsomra

## 2017-02-21 NOTE — Telephone Encounter (Signed)
Pt says it helps some never has been a good sleeper Concerned about the weight Has not tried Belsomra that he knows of Will try 1.5 tabs of the Mirtazapine first and let you know how this goes

## 2017-02-27 DIAGNOSIS — L570 Actinic keratosis: Secondary | ICD-10-CM | POA: Diagnosis not present

## 2017-02-27 DIAGNOSIS — C4442 Squamous cell carcinoma of skin of scalp and neck: Secondary | ICD-10-CM | POA: Diagnosis not present

## 2017-02-27 DIAGNOSIS — D485 Neoplasm of uncertain behavior of skin: Secondary | ICD-10-CM | POA: Diagnosis not present

## 2017-02-27 DIAGNOSIS — Z85828 Personal history of other malignant neoplasm of skin: Secondary | ICD-10-CM | POA: Diagnosis not present

## 2017-03-15 DIAGNOSIS — C4442 Squamous cell carcinoma of skin of scalp and neck: Secondary | ICD-10-CM | POA: Diagnosis not present

## 2017-03-24 ENCOUNTER — Other Ambulatory Visit: Payer: Self-pay | Admitting: Nurse Practitioner

## 2017-03-24 DIAGNOSIS — K219 Gastro-esophageal reflux disease without esophagitis: Secondary | ICD-10-CM

## 2017-03-26 ENCOUNTER — Other Ambulatory Visit: Payer: Medicare HMO

## 2017-03-26 DIAGNOSIS — L4059 Other psoriatic arthropathy: Secondary | ICD-10-CM | POA: Diagnosis not present

## 2017-03-29 ENCOUNTER — Telehealth: Payer: Self-pay | Admitting: *Deleted

## 2017-03-29 DIAGNOSIS — E291 Testicular hypofunction: Secondary | ICD-10-CM

## 2017-03-29 MED ORDER — TESTOSTERONE 40.5 MG/2.5GM (1.62%) TD GEL: 1.0000 "application " | Freq: Every day | TRANSDERMAL | 2 refills | Status: DC

## 2017-03-29 NOTE — Telephone Encounter (Signed)
Fax received [facility] Refill request Testosterone 10% cream apply 51ml topically to muscle area qam #30 ml

## 2017-04-03 DIAGNOSIS — D1800 Hemangioma unspecified site: Secondary | ICD-10-CM | POA: Diagnosis not present

## 2017-04-03 DIAGNOSIS — Z85828 Personal history of other malignant neoplasm of skin: Secondary | ICD-10-CM | POA: Diagnosis not present

## 2017-04-06 ENCOUNTER — Telehealth: Payer: Self-pay | Admitting: *Deleted

## 2017-04-06 MED ORDER — OLMESARTAN MEDOXOMIL 40 MG PO TABS: 40.0000 mg | ORAL_TABLET | Freq: Every day | ORAL | 0 refills | Status: DC

## 2017-04-06 NOTE — Telephone Encounter (Signed)
[facility] called and needs rx for olmesartan changed from 20mg  BID to 40mg  daily. Rx sent.

## 2017-04-09 ENCOUNTER — Encounter: Payer: Self-pay | Admitting: *Deleted

## 2017-04-09 ENCOUNTER — Ambulatory Visit (INDEPENDENT_AMBULATORY_CARE_PROVIDER_SITE_OTHER): Payer: Medicare HMO | Admitting: *Deleted

## 2017-04-09 VITALS — BP 136/66 | HR 61 | Ht 67.0 in | Wt 184.0 lb

## 2017-04-09 DIAGNOSIS — Z0000 Encounter for general adult medical examination without abnormal findings: Secondary | ICD-10-CM

## 2017-04-09 NOTE — Patient Instructions (Signed)
Please work on increasing your walking to 30-45 minutes at least 3 times per week.   At your convenience, please bring a copy of your Advanced Directives to our office to be filed in your chart.   Thank you for coming in for your Annual Wellness Visit today!  Preventive Care 35 Years and Older, Male Preventive care refers to lifestyle choices and visits with your health care provider that can promote health and wellness. What does preventive care include?  A yearly physical exam. This is also called an annual well check.  Dental exams once or twice a year.  Routine eye exams. Ask your health care provider how often you should have your eyes checked.  Personal lifestyle choices, including: ? Daily care of your teeth and gums. ? Regular physical activity. ? Eating a healthy diet. ? Avoiding tobacco and drug use. ? Limiting alcohol use. ? Practicing safe sex. ? Taking low doses of aspirin every day. ? Taking vitamin and mineral supplements as recommended by your health care provider. What happens during an annual well check? The services and screenings done by your health care provider during your annual well check will depend on your age, overall health, lifestyle risk factors, and family history of disease. Counseling Your health care provider may ask you questions about your:  Alcohol use.  Tobacco use.  Drug use.  Emotional well-being.  Home and relationship well-being.  Sexual activity.  Eating habits.  History of falls.  Memory and ability to understand (cognition).  Work and work Statistician.  Screening You may have the following tests or measurements:  Height, weight, and BMI.  Blood pressure.  Lipid and cholesterol levels. These may be checked every 5 years, or more frequently if you are over 50 years old.  Skin check.  Lung cancer screening. You may have this screening every year starting at age 64 if you have a 30-pack-year history of smoking and  currently smoke or have quit within the past 15 years.  Fecal occult blood test (FOBT) of the stool. You may have this test every year starting at age 41.  Flexible sigmoidoscopy or colonoscopy. You may have a sigmoidoscopy every 5 years or a colonoscopy every 10 years starting at age 19.  Prostate cancer screening. Recommendations will vary depending on your family history and other risks.  Hepatitis C blood test.  Hepatitis B blood test.  Sexually transmitted disease (STD) testing.  Diabetes screening. This is done by checking your blood sugar (glucose) after you have not eaten for a while (fasting). You may have this done every 1-3 years.  Abdominal aortic aneurysm (AAA) screening. You may need this if you are a current or former smoker.  Osteoporosis. You may be screened starting at age 11 if you are at high risk.  Talk with your health care provider about your test results, treatment options, and if necessary, the need for more tests. Vaccines Your health care provider may recommend certain vaccines, such as:  Influenza vaccine. This is recommended every year.  Tetanus, diphtheria, and acellular pertussis (Tdap, Td) vaccine. You may need a Td booster every 10 years.  Varicella vaccine. You may need this if you have not been vaccinated.  Zoster vaccine. You may need this after age 7.  Measles, mumps, and rubella (MMR) vaccine. You may need at least one dose of MMR if you were born in 1957 or later. You may also need a second dose.  Pneumococcal 13-valent conjugate (PCV13) vaccine. One dose is  recommended after age 21.  Pneumococcal polysaccharide (PPSV23) vaccine. One dose is recommended after age 32.  Meningococcal vaccine. You may need this if you have certain conditions.  Hepatitis A vaccine. You may need this if you have certain conditions or if you travel or work in places where you may be exposed to hepatitis A.  Hepatitis B vaccine. You may need this if you have  certain conditions or if you travel or work in places where you may be exposed to hepatitis B.  Haemophilus influenzae type b (Hib) vaccine. You may need this if you have certain risk factors.  Talk to your health care provider about which screenings and vaccines you need and how often you need them. This information is not intended to replace advice given to you by your health care provider. Make sure you discuss any questions you have with your health care provider. Document Released: 01/22/2015 Document Revised: 09/15/2015 Document Reviewed: 10/27/2014 Elsevier Interactive Patient Education  [employer].

## 2017-04-10 NOTE — Progress Notes (Signed)
Subjective:   Rodney Hernandez is a 72 y.o. male who presents for an Initial Medicare Annual Wellness Visit.  Rodney Hernandez is accompanied today by his grandson who takes him to most of his appointments.  Rodney Hernandez does still drive, but prefers for his grandson to drive him if possible.  He enjoys attending church, working on his car and yard work Actuary.  He lives alone, but in very close proximity to his daughter and grandson who he sees daily.  Rodney Hernandez has 2 children and 7 grandchildren.  He feels his health this year is better than it was last year because he feels better in general.  He reports no hospitalizations, ER visits, or surgeries in the past year.   Review of Systems  Musculoskeletal- pain in bilateral feet, hands, and hips        Objective:    Today's Vitals   04/09/17 1514  BP: 136/66  Pulse: 61  Weight: 184 lb (83.5 kg)  Height: 5\' 7"  (1.702 m)  PainSc: 5   PainLoc: Foot   Body mass index is 28.82 kg/m.  Advanced Directives 04/09/2017  Does Patient Have a Medical Advance Directive? Yes  Type of Advance Directive [place] in Chart? No - copy requested    Current Medications (verified) Outpatient Encounter Medications as of 04/09/2017  Medication Sig  . albuterol (VENTOLIN HFA) 108 (90 Base) MCG/ACT inhaler INHALE 2 PUFFS INTO THE LUNGS EVERY 6 (SIX) HOURS AS NEEDED FOR WHEEZING OR SHORTNESS OF BREATH.  Marland Kitchen atorvastatin (LIPITOR) 40 MG tablet Take 1 tablet (40 mg total) by mouth every evening.  . escitalopram (LEXAPRO) 20 MG tablet Take 1 tablet (20 mg total) by mouth daily.  . folic acid (FOLVITE) 1 MG tablet   . furosemide (LASIX) 20 MG tablet Take 1 tablet (20 mg total) daily by mouth.  . mirtazapine (REMERON) 30 MG tablet Take 1 tablet (30 mg total) by mouth at bedtime.  Marland Kitchen olmesartan (BENICAR) 40 MG tablet Take 1 tablet (40 mg total) by mouth daily.  . pantoprazole (PROTONIX) 40 MG tablet Take 1 tablet  (40 mg total) by mouth 2 (two) times daily.  . sucralfate (CARAFATE) 1 g tablet TAKE 1 TABLET (1 G TOTAL) BY MOUTH 4 (FOUR) TIMES DAILY - WITH MEALS AND AT BEDTIME.  Marland Kitchen Testosterone (ANDROGEL) 40.5 MG/2.5GM (1.62%) GEL Apply 1 application topically daily.  . traMADol (ULTRAM) 50 MG tablet Take 50 mg by mouth 2 (two) times daily.  . methotrexate 25 MG/ML injection Inject 50 mg/m2 as directed once a week.    No facility-administered encounter medications on file as of 04/09/2017.     Allergies (verified) Nsaids; Fluticasone; Asa [aspirin]; Morphine and related; and Sulfa antibiotics   History: Past Medical History:  Diagnosis Date  . Arthritis    psoriatic arthritis and osteo arthritis  . Asthma   . Collapse of lung   . Colon polyps   . Eczema   . GERD (gastroesophageal reflux disease)   . Hypertension   . Seasonal allergies   . Vertigo    Past Surgical History:  Procedure Laterality Date  . collapsed lung    . HERNIA REPAIR    . PROSTATE SURGERY     Family History  Problem Relation Age of Onset  . Lung cancer Father   . Prostate cancer Father   . Stomach cancer Maternal Grandmother   . Prostate cancer Paternal Uncle    Social  History   Socioeconomic History  . Marital status: Widowed    Spouse name: Not on file  . Number of children: 2  . Years of education: 45  . Highest education level: Not on file  Occupational History  . Not on file  Social Needs  . Financial resource strain: Not on file  . Food insecurity:    Worry: Not on file    Inability: Not on file  . Transportation needs:    Medical: Not on file    Non-medical: Not on file  Tobacco Use  . Smoking status: Former Smoker    Packs/day: 2.00    Years: 22.00    Pack years: 44.00    Types: Cigarettes    Last attempt to quit: 01/09/1981    Years since quitting: 36.2  . Smokeless tobacco: Never Used  Substance and Sexual Activity  . Alcohol use: No  . Drug use: No  . Sexual activity: Not Currently    Lifestyle  . Physical activity:    Days per week: Not on file    Minutes per session: Not on file  . Stress: Not on file  Relationships  . Social connections:    Talks on phone: Not on file    Gets together: Not on file    Attends religious service: Not on file    Active member of club or organization: Not on file    Attends meetings of clubs or organizations: Not on file    Relationship status: Not on file  Other Topics Concern  . Not on file  Social History Narrative   Widowed, 2 children   Right handed   12th grade   3 cups daily   Tobacco Counseling Counseling given: No   Clinical Intake:     Pain Score: 5    Patient states his feet, hips, and hands are hurting- states this is a chronic pain due to arthritis              Activities of Daily Living In your present state of health, do you have any difficulty performing the following activities: 04/09/2017  Hearing? Y  Comment Has seen audiologist hearing aid recomended, but declines at this time  Vision? N  Difficulty concentrating or making decisions? N  Walking or climbing stairs? N  Dressing or bathing? N  Doing errands, shopping? N  Comment Grandson usually drives him, but he is ablet to do errands alone if needed  Preparing Food and eating ? N  Using the Toilet? N  In the past six months, have you accidently leaked urine? N  Do you have problems with loss of bowel control? N  Managing your Medications? N  Managing your Finances? N  Housekeeping or managing your Housekeeping? N  Some recent data might be hidden     Immunizations and Health Maintenance Immunization History  Administered Date(s) Administered  . Influenza, High Dose Seasonal PF 10/20/2013, 10/11/2015, 10/11/2016  . Influenza,inj,Quad PF,6+ Mos 10/14/2014  . Pneumococcal Conjugate-13 03/02/2014  . Pneumococcal Polysaccharide-23 11/10/2011  . Td 03/02/2014   There are no preventive care reminders to display for this  patient.  Patient Care Team: Chevis Pretty, FNP as PCP - General (Nurse Practitioner) Prudencio Pair as Physician Assistant (Emergency Medicine)      Assessment:   This is a routine wellness examination for Rodney Hernandez.  Hearing/Vision screen Patient states he does have trouble hearing, and has seen an audiologist who suggested he get hearing aids.  Patient  declines hearing aids at this time.   He is seen regularly at [facility] in [place], [place]- he has an appointment with them this month.  States he sees well with his glasses.   Dietary issues and exercise activities discussed: Current Exercise Habits: The patient does not participate in regular exercise at present, Exercise limited by: orthopedic condition(s);respiratory conditions(s)  Patient states his exercise is limited during the winter months due to the weather.  He plans to increase his exercise as the weather improves this spring and summer.   Patient states he usually eats 3 meals per day and snacks as needed.  He states he tries to avoid fried foods.  He states if he eats too many vegetables he experiences significant GERD symptoms.     Goals    . Exercise 3x per week (30 min per time)     Increase walking to 30-45 minutes at least 3 days per week.      Depression Screen PHQ 2/9 Scores 04/09/2017 11/21/2016 08/17/2016 05/09/2016  PHQ - 2 Score 0 0 2 0  PHQ- 9 Score - - 10 -    Fall Risk Fall Risk  04/09/2017 11/21/2016 08/17/2016 05/09/2016 12/21/2015  Falls in the past year? No No No No No  Number falls in past yr: - - - - -  Injury with Fall? - - - - -    Is the patient's home free of loose throw rugs in walkways, pet beds, electrical cords, etc?   yes      Grab bars in the bathroom? no      Handrails on the stairs?   yes      Adequate lighting?   yes   Cognitive Function: MMSE - Mini Mental State Exam 04/10/2017  Orientation to time 5  Orientation to Place 5  Registration 3  Attention/ Calculation 5   Recall 1  Language- name 2 objects 2  Language- repeat 1  Language- follow 3 step command 3  Language- read & follow direction 1  Write a sentence 1  Copy design 1  Total score 28        Screening Tests Health Maintenance  Topic Date Due  . COLONOSCOPY  07/26/2017  . INFLUENZA VACCINE  08/09/2017  . TETANUS/TDAP  03/02/2024  . Hepatitis C Screening  Completed  . PNA vac Low Risk Adult  Completed    Qualifies for Shingles Vaccine? Declined today  Cancer Screenings: Lung: Low Dose CT Chest recommended if Age 43-80 years, 30 pack-year currently smoking OR have quit w/in 15years. Patient does qualify. Colorectal: up to date  Additional Screenings:  Hepatitis C Screening: completed 05/2015      Plan:     Work on increasing walking to 30-45 minutes at least 3 times per week.  At your convenience, please bring a copy of your Advanced Directives to our office to be filed in your chart.     I have personally reviewed and noted the following in the patient's chart:   . Medical and social history . Use of alcohol, tobacco or illicit drugs  . Current medications and supplements . Functional ability and status . Nutritional status . Physical activity . Advanced directives . List of other physicians . Hospitalizations, surgeries, and ER visits in previous 12 months . Vitals . Screenings to include cognitive, depression, and falls . Referrals and appointments  In addition, I have reviewed and discussed with patient certain preventive protocols, quality metrics, and best practice recommendations. A written  personalized care plan for preventive services as well as general preventive health recommendations were provided to patient.     WYATT, AMY M, RN   04/10/2017   I have reviewed and agree with the above AWV documentation.   Mary-Margaret Hassell Done, FNP

## 2017-04-17 DIAGNOSIS — M25561 Pain in right knee: Secondary | ICD-10-CM | POA: Diagnosis not present

## 2017-04-17 DIAGNOSIS — Z6826 Body mass index (BMI) 26.0-26.9, adult: Secondary | ICD-10-CM | POA: Diagnosis not present

## 2017-04-17 DIAGNOSIS — M150 Primary generalized (osteo)arthritis: Secondary | ICD-10-CM | POA: Diagnosis not present

## 2017-04-17 DIAGNOSIS — L4059 Other psoriatic arthropathy: Secondary | ICD-10-CM | POA: Diagnosis not present

## 2017-04-17 DIAGNOSIS — M25562 Pain in left knee: Secondary | ICD-10-CM | POA: Diagnosis not present

## 2017-04-17 DIAGNOSIS — M2550 Pain in unspecified joint: Secondary | ICD-10-CM | POA: Diagnosis not present

## 2017-04-17 DIAGNOSIS — Z79899 Other long term (current) drug therapy: Secondary | ICD-10-CM | POA: Diagnosis not present

## 2017-04-17 DIAGNOSIS — E663 Overweight: Secondary | ICD-10-CM | POA: Diagnosis not present

## 2017-05-01 ENCOUNTER — Ambulatory Visit: Payer: Self-pay | Admitting: Nurse Practitioner

## 2017-05-01 DIAGNOSIS — H524 Presbyopia: Secondary | ICD-10-CM | POA: Diagnosis not present

## 2017-05-01 DIAGNOSIS — I10 Essential (primary) hypertension: Secondary | ICD-10-CM | POA: Diagnosis not present

## 2017-05-01 DIAGNOSIS — H25813 Combined forms of age-related cataract, bilateral: Secondary | ICD-10-CM | POA: Diagnosis not present

## 2017-05-01 DIAGNOSIS — Z0100 Encounter for examination of eyes and vision without abnormal findings: Secondary | ICD-10-CM | POA: Diagnosis not present

## 2017-05-07 ENCOUNTER — Encounter: Payer: Self-pay | Admitting: Nurse Practitioner

## 2017-05-07 ENCOUNTER — Ambulatory Visit (INDEPENDENT_AMBULATORY_CARE_PROVIDER_SITE_OTHER): Payer: Medicare HMO | Admitting: Nurse Practitioner

## 2017-05-07 VITALS — BP 126/61 | HR 61 | Temp 97.0°F | Ht 67.0 in | Wt 181.0 lb

## 2017-05-07 DIAGNOSIS — I700 Atherosclerosis of aorta: Secondary | ICD-10-CM

## 2017-05-07 DIAGNOSIS — J4520 Mild intermittent asthma, uncomplicated: Secondary | ICD-10-CM | POA: Diagnosis not present

## 2017-05-07 DIAGNOSIS — E291 Testicular hypofunction: Secondary | ICD-10-CM

## 2017-05-07 DIAGNOSIS — L409 Psoriasis, unspecified: Secondary | ICD-10-CM

## 2017-05-07 DIAGNOSIS — E785 Hyperlipidemia, unspecified: Secondary | ICD-10-CM

## 2017-05-07 DIAGNOSIS — K219 Gastro-esophageal reflux disease without esophagitis: Secondary | ICD-10-CM | POA: Diagnosis not present

## 2017-05-07 DIAGNOSIS — F321 Major depressive disorder, single episode, moderate: Secondary | ICD-10-CM | POA: Diagnosis not present

## 2017-05-07 DIAGNOSIS — L4050 Arthropathic psoriasis, unspecified: Secondary | ICD-10-CM | POA: Diagnosis not present

## 2017-05-07 DIAGNOSIS — I10 Essential (primary) hypertension: Secondary | ICD-10-CM

## 2017-05-07 DIAGNOSIS — N401 Enlarged prostate with lower urinary tract symptoms: Secondary | ICD-10-CM

## 2017-05-07 DIAGNOSIS — R609 Edema, unspecified: Secondary | ICD-10-CM | POA: Diagnosis not present

## 2017-05-07 DIAGNOSIS — F5101 Primary insomnia: Secondary | ICD-10-CM

## 2017-05-07 DIAGNOSIS — R600 Localized edema: Secondary | ICD-10-CM

## 2017-05-07 MED ORDER — ESCITALOPRAM OXALATE 20 MG PO TABS: 20.0000 mg | ORAL_TABLET | Freq: Every day | ORAL | 1 refills | Status: DC

## 2017-05-07 MED ORDER — SUVOREXANT 10 MG PO TABS: 10.0000 mg | ORAL_TABLET | Freq: Every evening | ORAL | 2 refills | Status: DC

## 2017-05-07 MED ORDER — ATORVASTATIN CALCIUM 40 MG PO TABS: 40.0000 mg | ORAL_TABLET | Freq: Every evening | ORAL | 1 refills | Status: DC

## 2017-05-07 MED ORDER — PANTOPRAZOLE SODIUM 40 MG PO TBEC: 40.0000 mg | DELAYED_RELEASE_TABLET | Freq: Two times a day (BID) | ORAL | 1 refills | Status: DC

## 2017-05-07 MED ORDER — OLMESARTAN MEDOXOMIL 40 MG PO TABS: 40.0000 mg | ORAL_TABLET | Freq: Every day | ORAL | 0 refills | Status: DC

## 2017-05-07 MED ORDER — TESTOSTERONE 40.5 MG/2.5GM (1.62%) TD GEL
1.0000 | Freq: Every day | TRANSDERMAL | 2 refills | Status: DC
Start: 2017-05-07 — End: 2017-08-07

## 2017-05-07 MED ORDER — FUROSEMIDE 20 MG PO TABS: 20.0000 mg | ORAL_TABLET | Freq: Every day | ORAL | 1 refills | Status: DC

## 2017-05-07 MED ORDER — TESTOSTERONE 40.5 MG/2.5GM (1.62%) TD GEL: 1.0000 "application " | Freq: Every day | TRANSDERMAL | 2 refills | Status: DC

## 2017-05-07 NOTE — Progress Notes (Signed)
Subjective:    Patient ID: Rodney Hernandez, male    DOB: February 13, 1945, 72 y.o.   MRN: [id number]  HPI Rodney Hernandez is here today for follow up of chronic medical problem.  Outpatient Encounter Medications as of 05/07/2017  Medication Sig  . albuterol (VENTOLIN HFA) 108 (90 Base) MCG/ACT inhaler INHALE 2 PUFFS INTO THE LUNGS EVERY 6 (SIX) HOURS AS NEEDED FOR WHEEZING OR SHORTNESS OF BREATH.  Marland Kitchen atorvastatin (LIPITOR) 40 MG tablet Take 1 tablet (40 mg total) by mouth every evening.  . escitalopram (LEXAPRO) 20 MG tablet Take 1 tablet (20 mg total) by mouth daily.  . folic acid (FOLVITE) 1 MG tablet   . furosemide (LASIX) 20 MG tablet Take 1 tablet (20 mg total) daily by mouth.  . methotrexate 25 MG/ML injection Inject 50 mg/m2 as directed once a week.   . mirtazapine (REMERON) 30 MG tablet Take 1 tablet (30 mg total) by mouth at bedtime.  Marland Kitchen olmesartan (BENICAR) 40 MG tablet Take 1 tablet (40 mg total) by mouth daily.  . sucralfate (CARAFATE) 1 g tablet TAKE 1 TABLET (1 G TOTAL) BY MOUTH 4 (FOUR) TIMES DAILY - WITH MEALS AND AT BEDTIME.  Marland Kitchen Testosterone (ANDROGEL) 40.5 MG/2.5GM (1.62%) GEL Apply 1 application topically daily.  . traMADol (ULTRAM) 50 MG tablet Take 50 mg by mouth 2 (two) times daily.  . pantoprazole (PROTONIX) 40 MG tablet Take 1 tablet (40 mg total) by mouth 2 (two) times daily.     1. Essential hypertension  No c/oi chest pain, no increasing SOB or headache. Does not check blood pressure at home. BP Readings from Last 3 Encounters:  05/07/17 126/61  04/09/17 136/66  01/30/17 138/72     2. Aortic atherosclerosis ([place])  Seen on chest xray- we are currently monitoring  3. Gastroesophageal reflux disease without esophagitis  Uses carafate which works well unless he does not eat right.  4. Psoriatic arthritis ([place])  Sees DR. Beckman- is on methotrextae weekly and tramadol  5. Psoriasis  Not having any skin problems  6. Benign prostatic hyperplasia with lower urinary tract  symptoms, symptom details unspecified no urinary problems  7. Peripheral edema  No recent swelling  8. Hyperlipidemia with target LDL less than 100  Does not  Really watch diet- stays very active  9. Mild intermittent chronic asthma without complication  No recent breathing problems  10.    insomnia          He is currently on remeron which is not helping him sleep at all. He tried [place]             which did not work for him.   New complaints: none  Social history: Lives alone- daughters check on heim daily   Review of Systems  Constitutional: Negative for activity change and appetite change.  HENT: Negative.   Eyes: Negative for pain.  Respiratory: Negative for shortness of breath.   Cardiovascular: Negative for chest pain, palpitations and leg swelling.  Gastrointestinal: Negative for abdominal pain.  Endocrine: Negative for polydipsia.  Genitourinary: Negative.   Skin: Negative for rash.  Neurological: Negative for dizziness, weakness and headaches.  Hematological: Does not bruise/bleed easily.  Psychiatric/Behavioral: Negative.   All other systems reviewed and are negative.      Objective:   Physical Exam  Constitutional: He is oriented to person, place, and time.  HENT:  Head: Normocephalic.  Nose: Nose normal.  Mouth/Throat: Oropharynx is clear and moist.  Eyes: Pupils are equal,  round, and reactive to light. EOM are normal.  Neck: Normal range of motion and phonation normal. Neck supple. No JVD present. Carotid bruit is not present. No thyroid mass and no thyromegaly present.  Cardiovascular: Normal rate and regular rhythm.  Pulmonary/Chest: Effort normal and breath sounds normal. No respiratory distress.  Abdominal: Soft. Normal appearance, normal aorta and bowel sounds are normal. There is no tenderness.  Musculoskeletal: Normal range of motion.  Lymphadenopathy:    He has no cervical adenopathy.  Neurological: He is alert and oriented to person, place, and  time.  Skin: Skin is warm and dry.  Psychiatric: Judgment normal.   BP 126/61   Pulse 61   Temp (!) 97 F (36.1 C) (Oral)   Ht _0  (1.702 m)   Wt 181 lb (82.1 kg)   BMI 28.35 kg/m         Assessment & Plan:  1. Essential hypertension Low sodium diet - olmesartan (BENICAR) 40 MG tablet; Take 1 tablet (40 mg total) by mouth daily.  Dispense: 90 tablet; Refill: 0 - CMP14+EGFR  2. Aortic atherosclerosis ([place]) Will continue to watch  3. Gastroesophageal reflux disease without esophagitis Avoid spicy foods Do not eat 2 hours prior to bedtime - pantoprazole (PROTONIX) 40 MG tablet; Take 1 tablet (40 mg total) by mouth 2 (two) times daily.  Dispense: 180 tablet; Refill: 1  4. Psoriatic arthritis ([place]) Keep follow up with RD. Beekman  5. Psoriasis  6. Benign prostatic hyperplasia with lower urinary tract symptoms, symptom details unspecified  7. Peripheral edema elevate legs when sitting - furosemide (LASIX) 20 MG tablet; Take 1 tablet (20 mg total) by mouth daily.  Dispense: 90 tablet; Refill: 1  8. Hyperlipidemia with target LDL less than 100 Low fta diet - atorvastatin (LIPITOR) 40 MG tablet; Take 1 tablet (40 mg total) by mouth every evening.  Dispense: 90 tablet; Refill: 1 - Lipid panel  9. Mild intermittent chronic asthma without complication  10. Depression, major, single episode, moderate (HCC) Stress management - escitalopram (LEXAPRO) 20 MG tablet; Take 1 tablet (20 mg total) by mouth daily.  Dispense: 90 tablet; Refill: 1  11. Hypogonadism in male - Testosterone (ANDROGEL) 40.5 MG/2.5GM (1.62%) GEL; Apply 1 application topically daily.  Dispense: 7.5 g; Refill: 2  12. Primary insomnia Going to try belsomra Patient will let me know how it works for him - Suvorexant (BELSOMRA) 10 MG TABS; Take 10 mg by mouth Nightly.  Dispense: 30 tablet; Refill: 2    Labs pending Health maintenance reviewed Diet and exercise encouraged Continue all meds Follow up   In 3 months   [place], FNP

## 2017-05-07 NOTE — Addendum Note (Signed)
Addended by: Chevis Pretty on: 05/07/2017 12:45 PM   Modules accepted: Orders

## 2017-05-07 NOTE — Patient Instructions (Signed)

## 2017-05-08 LAB — CMP14+EGFR
ALBUMIN: 4.1 g/dL (ref 3.5–4.8)
ALK PHOS: 110 IU/L (ref 39–117)
ALT: 24 IU/L (ref 0–44)
AST: 18 IU/L (ref 0–40)
Albumin/Globulin Ratio: 1.8 (ref 1.2–2.2)
BUN / CREAT RATIO: 13 (ref 10–24)
BUN: 15 mg/dL (ref 8–27)
Bilirubin Total: 0.3 mg/dL (ref 0.0–1.2)
CALCIUM: 10 mg/dL (ref 8.6–10.2)
CO2: 25 mmol/L (ref 20–29)
CREATININE: 1.18 mg/dL (ref 0.76–1.27)
Chloride: 103 mmol/L (ref 96–106)
GFR calc Af Amer: 70 mL/min/{1.73_m2} (ref >59)
GFR, EST NON AFRICAN AMERICAN: 60 mL/min/{1.73_m2} (ref >59)
GLOBULIN, TOTAL: 2.3 g/dL (ref 1.5–4.5)
Glucose: 93 mg/dL (ref 65–99)
Potassium: 4.1 mmol/L (ref 3.5–5.2)
SODIUM: 141 mmol/L (ref 134–144)
Total Protein: 6.4 g/dL (ref 6.0–8.5)

## 2017-05-08 LAB — LIPID PANEL
Chol/HDL Ratio: 2.6 ratio (ref 0.0–5.0)
Cholesterol, Total: 124 mg/dL (ref 100–199)
HDL: 47 mg/dL (ref >39)
LDL CALC: 49 mg/dL (ref 0–99)
Triglycerides: 140 mg/dL (ref 0–149)
VLDL Cholesterol Cal: 28 mg/dL (ref 5–40)

## 2017-05-09 ENCOUNTER — Telehealth: Payer: Self-pay | Admitting: Nurse Practitioner

## 2017-05-09 NOTE — Telephone Encounter (Signed)
Rx canceled

## 2017-06-04 IMAGING — CR DG CHEST 2V
2 series · 2 of 2 positions shown · non-contrast
Comparison: No prior.

CLINICAL DATA: Hypertension.

EXAM:
CHEST  2 VIEW

[view not recorded (1 of 2)]
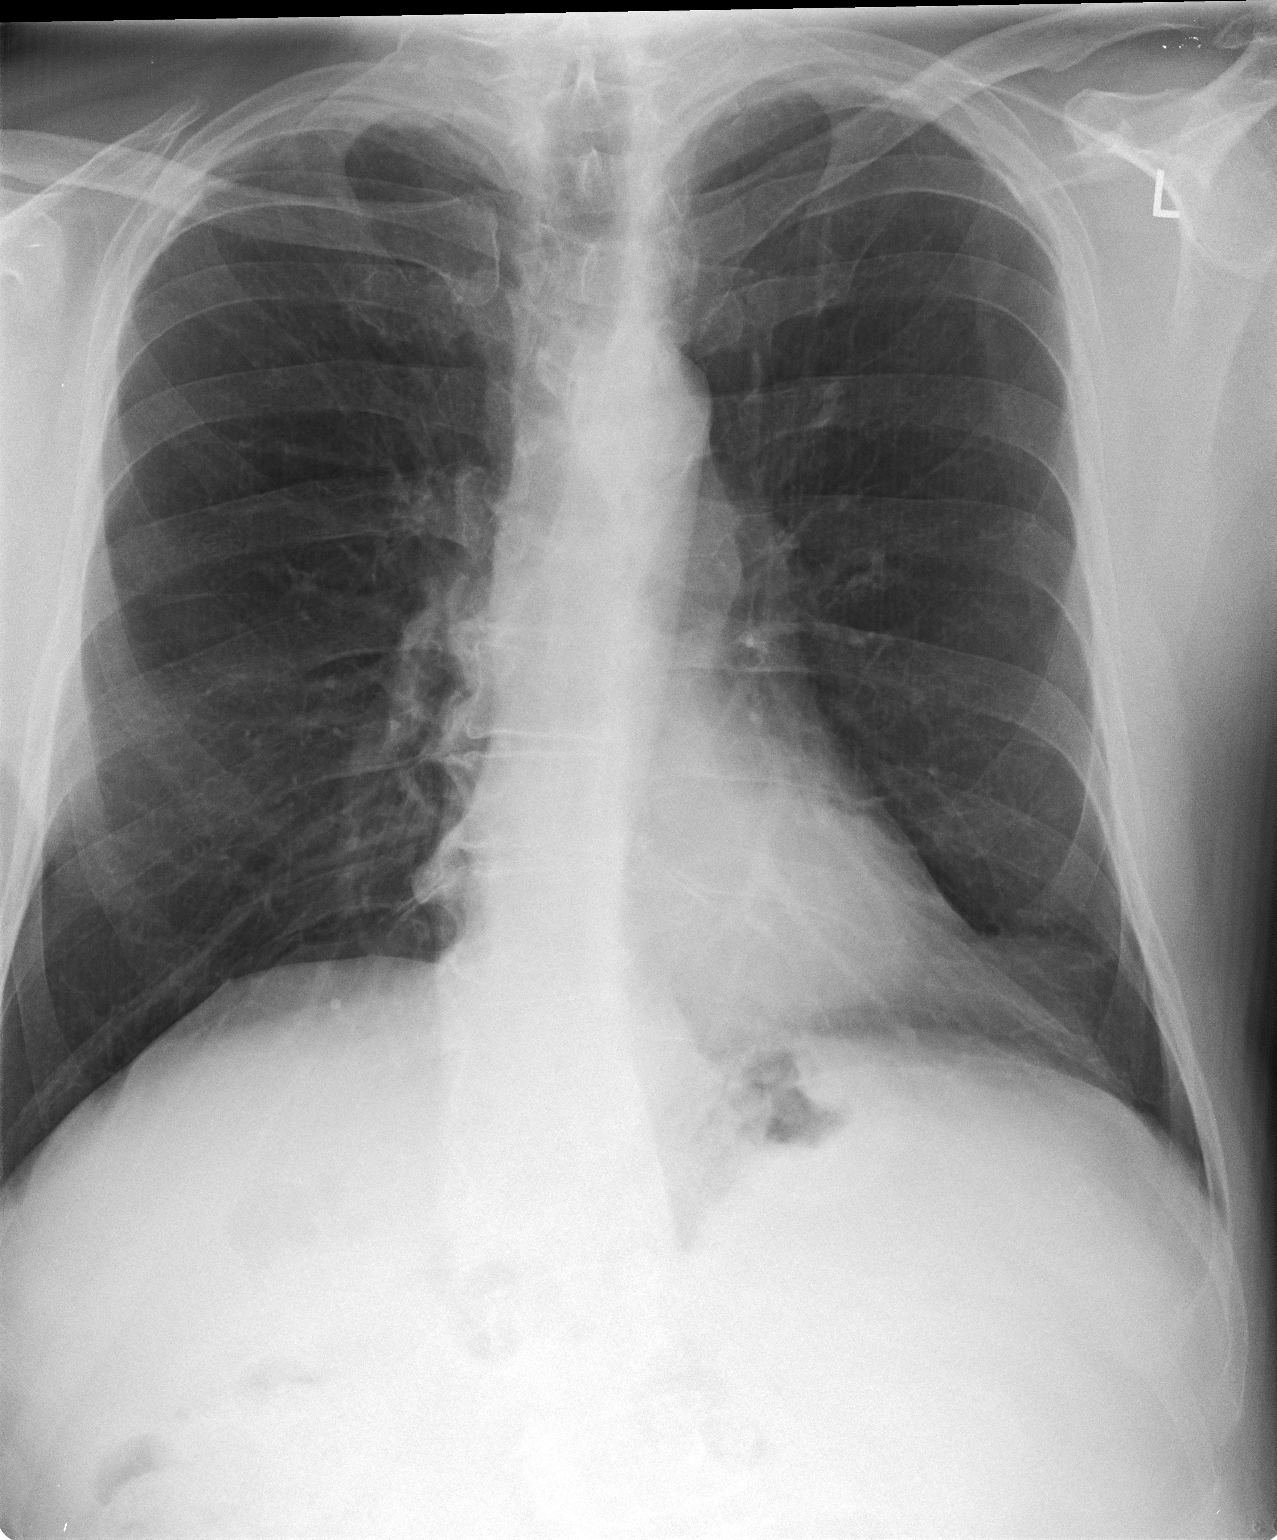

[view not recorded (2 of 2)]
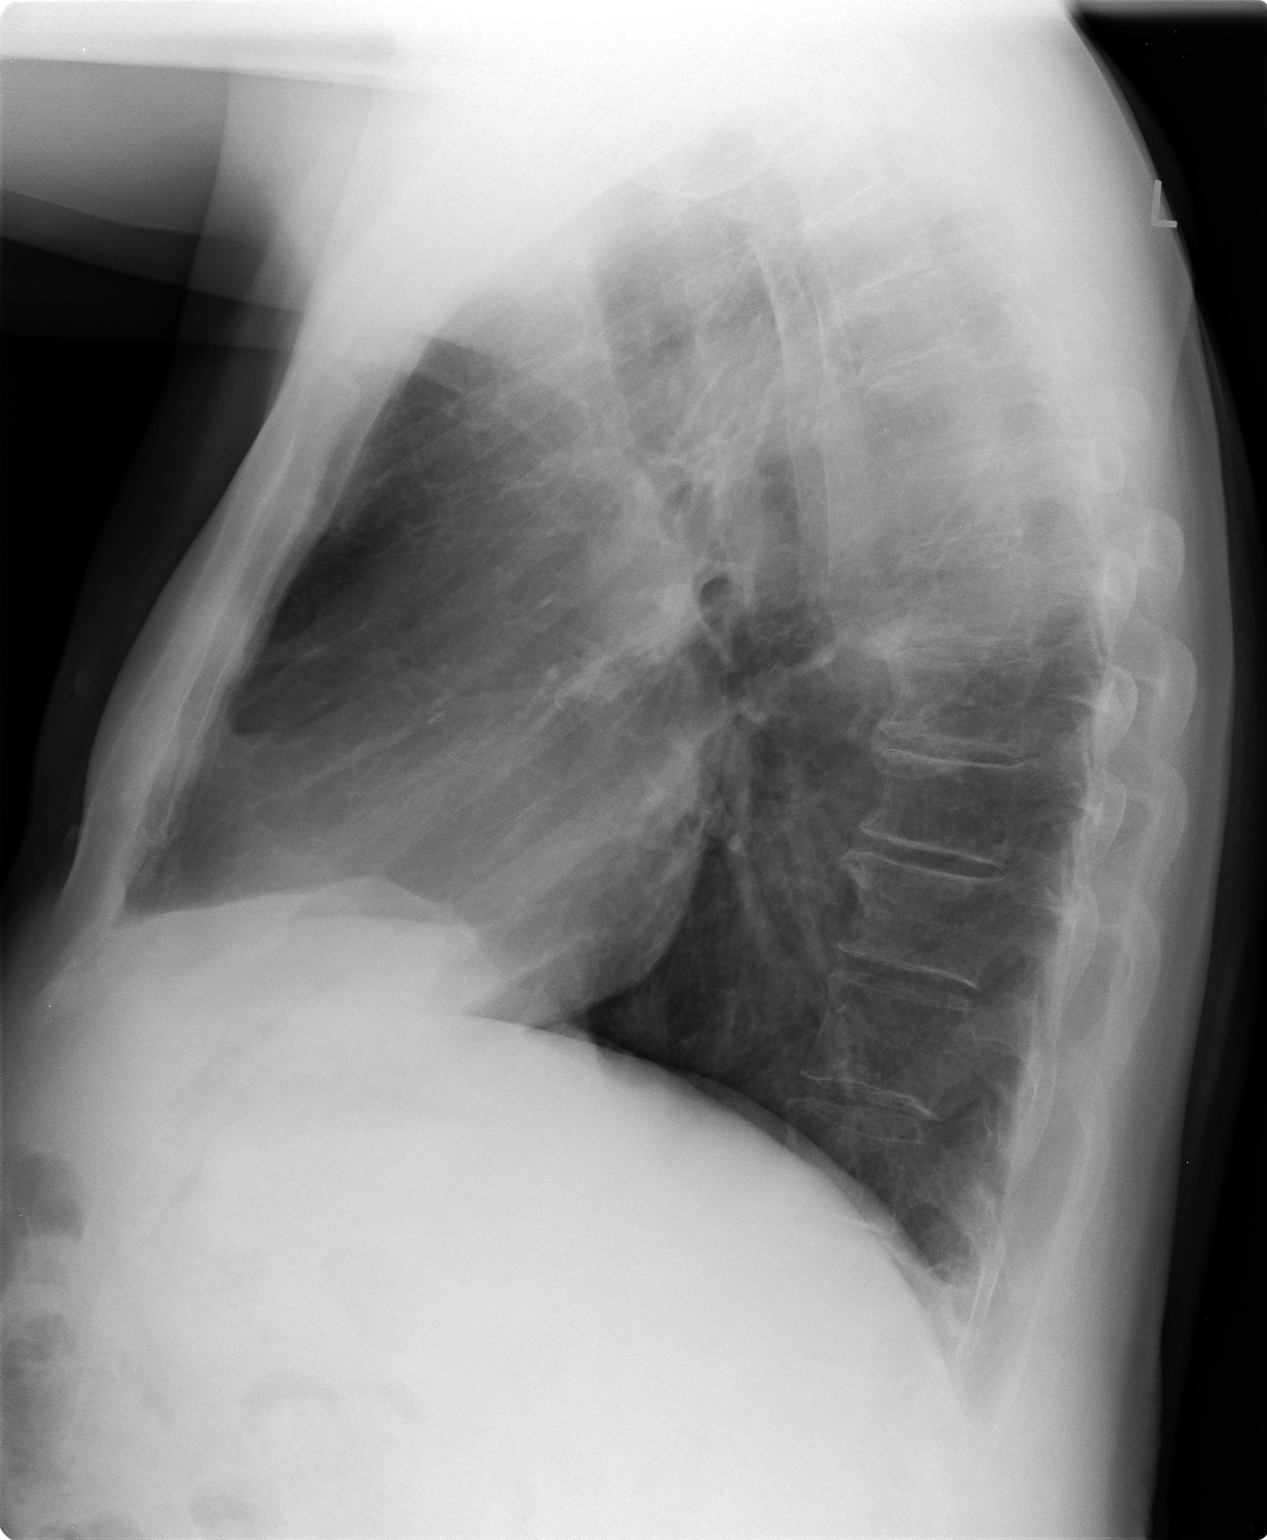

[2 of 2 positions shown; findings below may reference images not displayed]

FINDINGS: Mediastinum and hilar structures normal. Mild bibasilar subsegmental
atelectasis. Prominent epicardial fat pad on the left cannot be
excluded. Borderline cardiomegaly. Normal pulmonary vascularity.
Biapical pleural-parenchymal thickening noted most consistent
scarring. No pleural effusion or pneumothorax.
IMPRESSION: 1. Mild bibasilar subsegmental atelectasis. Prominent epicardial fat
pad on the left cannot be excluded .

2. Borderline cardiomegaly.  No pulmonary venous congestion .

## 2017-06-19 DIAGNOSIS — L4059 Other psoriatic arthropathy: Secondary | ICD-10-CM | POA: Diagnosis not present

## 2017-06-19 DIAGNOSIS — Z79899 Other long term (current) drug therapy: Secondary | ICD-10-CM | POA: Diagnosis not present

## 2017-06-19 DIAGNOSIS — M150 Primary generalized (osteo)arthritis: Secondary | ICD-10-CM | POA: Diagnosis not present

## 2017-06-19 DIAGNOSIS — M2550 Pain in unspecified joint: Secondary | ICD-10-CM | POA: Diagnosis not present

## 2017-06-19 DIAGNOSIS — E663 Overweight: Secondary | ICD-10-CM | POA: Diagnosis not present

## 2017-06-19 DIAGNOSIS — Z6825 Body mass index (BMI) 25.0-25.9, adult: Secondary | ICD-10-CM | POA: Diagnosis not present

## 2017-06-19 DIAGNOSIS — M25561 Pain in right knee: Secondary | ICD-10-CM | POA: Diagnosis not present

## 2017-06-19 DIAGNOSIS — M5416 Radiculopathy, lumbar region: Secondary | ICD-10-CM | POA: Diagnosis not present

## 2017-06-19 DIAGNOSIS — M25562 Pain in left knee: Secondary | ICD-10-CM | POA: Diagnosis not present

## 2017-07-04 ENCOUNTER — Other Ambulatory Visit: Payer: Self-pay | Admitting: Nurse Practitioner

## 2017-07-04 DIAGNOSIS — K219 Gastro-esophageal reflux disease without esophagitis: Secondary | ICD-10-CM

## 2017-07-11 DIAGNOSIS — L4059 Other psoriatic arthropathy: Secondary | ICD-10-CM | POA: Diagnosis not present

## 2017-07-11 DIAGNOSIS — E663 Overweight: Secondary | ICD-10-CM | POA: Diagnosis not present

## 2017-07-11 DIAGNOSIS — M25551 Pain in right hip: Secondary | ICD-10-CM | POA: Diagnosis not present

## 2017-07-11 DIAGNOSIS — M2550 Pain in unspecified joint: Secondary | ICD-10-CM | POA: Diagnosis not present

## 2017-07-11 DIAGNOSIS — M5416 Radiculopathy, lumbar region: Secondary | ICD-10-CM | POA: Diagnosis not present

## 2017-07-11 DIAGNOSIS — M25561 Pain in right knee: Secondary | ICD-10-CM | POA: Diagnosis not present

## 2017-07-11 DIAGNOSIS — M150 Primary generalized (osteo)arthritis: Secondary | ICD-10-CM | POA: Diagnosis not present

## 2017-07-11 DIAGNOSIS — Z79899 Other long term (current) drug therapy: Secondary | ICD-10-CM | POA: Diagnosis not present

## 2017-07-11 DIAGNOSIS — M25562 Pain in left knee: Secondary | ICD-10-CM | POA: Diagnosis not present

## 2017-07-26 ENCOUNTER — Other Ambulatory Visit: Payer: Self-pay | Admitting: Nurse Practitioner

## 2017-07-26 DIAGNOSIS — K219 Gastro-esophageal reflux disease without esophagitis: Secondary | ICD-10-CM

## 2017-08-07 ENCOUNTER — Ambulatory Visit (INDEPENDENT_AMBULATORY_CARE_PROVIDER_SITE_OTHER): Payer: Medicare HMO | Admitting: Nurse Practitioner

## 2017-08-07 ENCOUNTER — Telehealth: Payer: Self-pay | Admitting: Nurse Practitioner

## 2017-08-07 ENCOUNTER — Other Ambulatory Visit: Payer: Self-pay | Admitting: Nurse Practitioner

## 2017-08-07 ENCOUNTER — Encounter: Payer: Self-pay | Admitting: Nurse Practitioner

## 2017-08-07 VITALS — BP 158/78 | HR 62 | Temp 97.5°F | Ht 67.0 in | Wt 184.2 lb

## 2017-08-07 DIAGNOSIS — F321 Major depressive disorder, single episode, moderate: Secondary | ICD-10-CM

## 2017-08-07 DIAGNOSIS — E291 Testicular hypofunction: Secondary | ICD-10-CM

## 2017-08-07 DIAGNOSIS — L4050 Arthropathic psoriasis, unspecified: Secondary | ICD-10-CM | POA: Diagnosis not present

## 2017-08-07 DIAGNOSIS — I10 Essential (primary) hypertension: Secondary | ICD-10-CM | POA: Diagnosis not present

## 2017-08-07 DIAGNOSIS — I700 Atherosclerosis of aorta: Secondary | ICD-10-CM | POA: Diagnosis not present

## 2017-08-07 DIAGNOSIS — R609 Edema, unspecified: Secondary | ICD-10-CM

## 2017-08-07 DIAGNOSIS — K219 Gastro-esophageal reflux disease without esophagitis: Secondary | ICD-10-CM | POA: Diagnosis not present

## 2017-08-07 DIAGNOSIS — N401 Enlarged prostate with lower urinary tract symptoms: Secondary | ICD-10-CM

## 2017-08-07 DIAGNOSIS — R600 Localized edema: Secondary | ICD-10-CM

## 2017-08-07 DIAGNOSIS — E785 Hyperlipidemia, unspecified: Secondary | ICD-10-CM | POA: Diagnosis not present

## 2017-08-07 MED ORDER — ATORVASTATIN CALCIUM 40 MG PO TABS: 40.0000 mg | ORAL_TABLET | Freq: Every evening | ORAL | 1 refills | Status: DC

## 2017-08-07 MED ORDER — SUCRALFATE 1 G PO TABS: 1.0000 g | ORAL_TABLET | Freq: Three times a day (TID) | ORAL | 0 refills | Status: DC

## 2017-08-07 MED ORDER — FUROSEMIDE 20 MG PO TABS: 20.0000 mg | ORAL_TABLET | Freq: Every day | ORAL | 1 refills | Status: DC

## 2017-08-07 MED ORDER — TESTOSTERONE 40.5 MG/2.5GM (1.62%) TD GEL: 1.0000 "application " | Freq: Every day | TRANSDERMAL | 2 refills | Status: DC

## 2017-08-07 MED ORDER — PANTOPRAZOLE SODIUM 40 MG PO TBEC: 40.0000 mg | DELAYED_RELEASE_TABLET | Freq: Two times a day (BID) | ORAL | 1 refills | Status: DC

## 2017-08-07 MED ORDER — ESCITALOPRAM OXALATE 20 MG PO TABS: 20.0000 mg | ORAL_TABLET | Freq: Every day | ORAL | 1 refills | Status: DC

## 2017-08-07 MED ORDER — OLMESARTAN MEDOXOMIL 40 MG PO TABS: 40.0000 mg | ORAL_TABLET | Freq: Every day | ORAL | 0 refills | Status: DC

## 2017-08-07 NOTE — Progress Notes (Signed)
Subjective:    Patient ID: Rodney Hernandez, male    DOB: [birth year], 72 y.o.   MRN: [id number]   Chief Complaint: medical management of chronic issues  HPI:  1. Essential hypertension  No c/o chest pain, sob or headaches. Does not check blood pressure at home. BP Readings from Last 3 Encounters:  08/07/17 (!) 153/76  05/07/17 126/61  04/09/17 136/66     2. Aortic atherosclerosis ([place])  Sen on chest xray- will continue to monitor  3. Gastroesophageal reflux disease without esophagitis ' Takes protonix and carafate daily and has no symptoms as long as stays on meds and does not over eat.  4. Psoriatic arthritis ([place])  See rheumatologist and is on methotrexate injections weekly. Is doing well. Has constaint pan but is bearable.   5. Benign prostatic hyperplasia with lower urinary tract symptoms, symptom details unspecified denies any urgency or frequency. Last PSA 6.3  6. Peripheral edema  Does not have everyday  7. Hyperlipidemia with target LDL less than 100  Does not really watch diet and does very little exercise.    Outpatient Encounter Medications as of 08/07/2017  Medication Sig  . albuterol (VENTOLIN HFA) 108 (90 Base) MCG/ACT inhaler INHALE 2 PUFFS INTO THE LUNGS EVERY 6 (SIX) HOURS AS NEEDED FOR WHEEZING OR SHORTNESS OF BREATH.  Marland Kitchen atorvastatin (LIPITOR) 40 MG tablet Take 1 tablet (40 mg total) by mouth every evening.  . escitalopram (LEXAPRO) 20 MG tablet Take 1 tablet (20 mg total) by mouth daily.  . folic acid (FOLVITE) 1 MG tablet   . furosemide (LASIX) 20 MG tablet Take 1 tablet (20 mg total) by mouth daily.  . methotrexate 25 MG/ML injection Inject 50 mg/m2 as directed once a week.   . olmesartan (BENICAR) 40 MG tablet Take 1 tablet (40 mg total) by mouth daily.  . pantoprazole (PROTONIX) 40 MG tablet Take 1 tablet (40 mg total) by mouth 2 (two) times daily.  . sucralfate (CARAFATE) 1 g tablet TAKE 1 TABLET (1 G TOTAL) BY MOUTH 4 (FOUR) TIMES DAILY - WITH MEALS AND AT  BEDTIME.  . Suvorexant (BELSOMRA) 10 MG TABS Take 10 mg by mouth Nightly.  . Testosterone (ANDROGEL) 40.5 MG/2.5GM (1.62%) GEL Apply 1 application topically daily.  . traMADol (ULTRAM) 50 MG tablet Take 50 mg by mouth 2 (two) times daily.       New complaints: Had some sciatic pain a moth ago and rheumatologist put him on lyrica which helped  Social history: Lives alone. Daughters check on him daily   Review of Systems  Constitutional: Negative for activity change and appetite change.  HENT: Negative.   Eyes: Negative for pain.  Respiratory: Negative for shortness of breath.   Cardiovascular: Negative for chest pain, palpitations and leg swelling.  Gastrointestinal: Negative for abdominal pain.  Endocrine: Negative for polydipsia.  Genitourinary: Negative.   Skin: Negative for rash.  Neurological: Negative for dizziness, weakness and headaches.  Hematological: Does not bruise/bleed easily.  Psychiatric/Behavioral: Negative.   All other systems reviewed and are negative.      Objective:   Physical Exam  Constitutional: He is oriented to person, place, and time.  HENT:  Head: Normocephalic.  Nose: Nose normal.  Mouth/Throat: Oropharynx is clear and moist.  Eyes: Pupils are equal, round, and reactive to light. EOM are normal.  Neck: Normal range of motion and phonation normal. Neck supple. No JVD present. Carotid bruit is not present. No thyroid mass and no thyromegaly present.  Cardiovascular: Normal  rate and regular rhythm.  Pulmonary/Chest: Effort normal and breath sounds normal. No respiratory distress.  Abdominal: Soft. Normal appearance, normal aorta and bowel sounds are normal. There is no tenderness.  Musculoskeletal: Normal range of motion.  Lymphadenopathy:    He has no cervical adenopathy.  Neurological: He is alert and oriented to person, place, and time.  Skin: Skin is warm and dry.  Psychiatric: Judgment normal.    BP (!) 158/78 (BP Location: Left Arm,  Cuff Size: Normal)   Pulse 62   Temp (!) 97.5 F (36.4 C) (Oral)   Ht '5\' 7"'  (1.702 m)   Wt 184 lb 4 oz (83.6 kg)   BMI 28.86 kg/m         Assessment & Plan:  Rodney Hernandez comes in today with chief complaint of Medical Management of Chronic Issues   Diagnosis and orders addressed:  1. Essential hypertension Low sodium diet - olmesartan (BENICAR) 40 MG tablet; Take 1 tablet (40 mg total) by mouth daily.  Dispense: 90 tablet; Refill: 0 - CMP14+EGFR  2. Aortic atherosclerosis ([place]) Will check at next visit  3. Gastroesophageal reflux disease without esophagitis Avoid spicy foods Do not eat 2 hours prior to bedtime - sucralfate (CARAFATE) 1 g tablet; Take 1 tablet (1 g total) by mouth 4 (four) times daily -  with meals and at bedtime.  Dispense: 120 tablet; Refill: 0 - pantoprazole (PROTONIX) 40 MG tablet; Take 1 tablet (40 mg total) by mouth 2 (two) times daily.  Dispense: 180 tablet; Refill: 1  4. Psoriatic arthritis ([place]) Keep follow up with rheumatology  5. Benign prostatic hyperplasia with lower urinary tract symptoms, symptom details unspecified psa pending - PSA, total and free  6. Peripheral edema Elevate legs when sitting - furosemide (LASIX) 20 MG tablet; Take 1 tablet (20 mg total) by mouth daily.  Dispense: 90 tablet; Refill: 1  7. Hyperlipidemia with target LDL less than 100 Low fat diet - atorvastatin (LIPITOR) 40 MG tablet; Take 1 tablet (40 mg total) by mouth every evening.  Dispense: 90 tablet; Refill: 1 - Lipid panel  8. Depression, major, single episode, moderate (HCC) Stress management - escitalopram (LEXAPRO) 20 MG tablet; Take 1 tablet (20 mg total) by mouth daily.  Dispense: 90 tablet; Refill: 1  9. Hypogonadism in male - Testosterone (ANDROGEL) 40.5 MG/2.5GM (1.62%) GEL; Apply 1 application topically daily.  Dispense: 7.5 g; Refill: 2   Labs pending Health Maintenance reviewed Diet and exercise encouraged  Follow up plan: 3  months    Rodney Hassell Done, FNP

## 2017-08-07 NOTE — Telephone Encounter (Signed)
testerosterone rx ready for pick up.

## 2017-08-07 NOTE — Telephone Encounter (Signed)
Aware rx is ready for pick up

## 2017-08-07 NOTE — Patient Instructions (Signed)

## 2017-08-07 NOTE — Telephone Encounter (Signed)
Please cancel testosterone sent to [facility]- will resend to [facility]

## 2017-08-08 LAB — CMP14+EGFR
A/G RATIO: 2.4 — AB (ref 1.2–2.2)
ALK PHOS: 97 IU/L (ref 39–117)
ALT: 20 IU/L (ref 0–44)
AST: 17 IU/L (ref 0–40)
Albumin: 4.4 g/dL (ref 3.5–4.8)
BILIRUBIN TOTAL: 0.4 mg/dL (ref 0.0–1.2)
BUN/Creatinine Ratio: 11 (ref 10–24)
BUN: 13 mg/dL (ref 8–27)
CO2: 25 mmol/L (ref 20–29)
Calcium: 10.5 mg/dL — ABNORMAL HIGH (ref 8.6–10.2)
Chloride: 101 mmol/L (ref 96–106)
Creatinine, Ser: 1.16 mg/dL (ref 0.76–1.27)
GFR calc Af Amer: 71 mL/min/{1.73_m2} (ref >59)
GFR, EST NON AFRICAN AMERICAN: 62 mL/min/{1.73_m2} (ref >59)
GLOBULIN, TOTAL: 1.8 g/dL (ref 1.5–4.5)
Glucose: 88 mg/dL (ref 65–99)
POTASSIUM: 4 mmol/L (ref 3.5–5.2)
SODIUM: 139 mmol/L (ref 134–144)
Total Protein: 6.2 g/dL (ref 6.0–8.5)

## 2017-08-08 LAB — LIPID PANEL
CHOL/HDL RATIO: 2.7 ratio (ref 0.0–5.0)
CHOLESTEROL TOTAL: 112 mg/dL (ref 100–199)
HDL: 41 mg/dL (ref >39)
LDL Calculated: 50 mg/dL (ref 0–99)
TRIGLYCERIDES: 107 mg/dL (ref 0–149)
VLDL Cholesterol Cal: 21 mg/dL (ref 5–40)

## 2017-08-08 LAB — PSA, TOTAL AND FREE
PSA FREE PCT: 20.4 %
PSA, Free: 1.63 ng/mL
Prostate Specific Ag, Serum: 8 ng/mL — ABNORMAL HIGH (ref 0.0–4.0)

## 2017-08-09 ENCOUNTER — Other Ambulatory Visit: Payer: Self-pay | Admitting: *Deleted

## 2017-08-09 DIAGNOSIS — R972 Elevated prostate specific antigen [PSA]: Secondary | ICD-10-CM

## 2017-08-11 IMAGING — DX DG CHEST 2V
2 series · 2 of 2 positions shown · non-contrast
Comparison: PA and lateral chest x-ray May 13, 2015

CLINICAL DATA: Cough, syncope, history of asthma, previous
pneumothorax.

EXAM:
CHEST  2 VIEW

[chest pa]
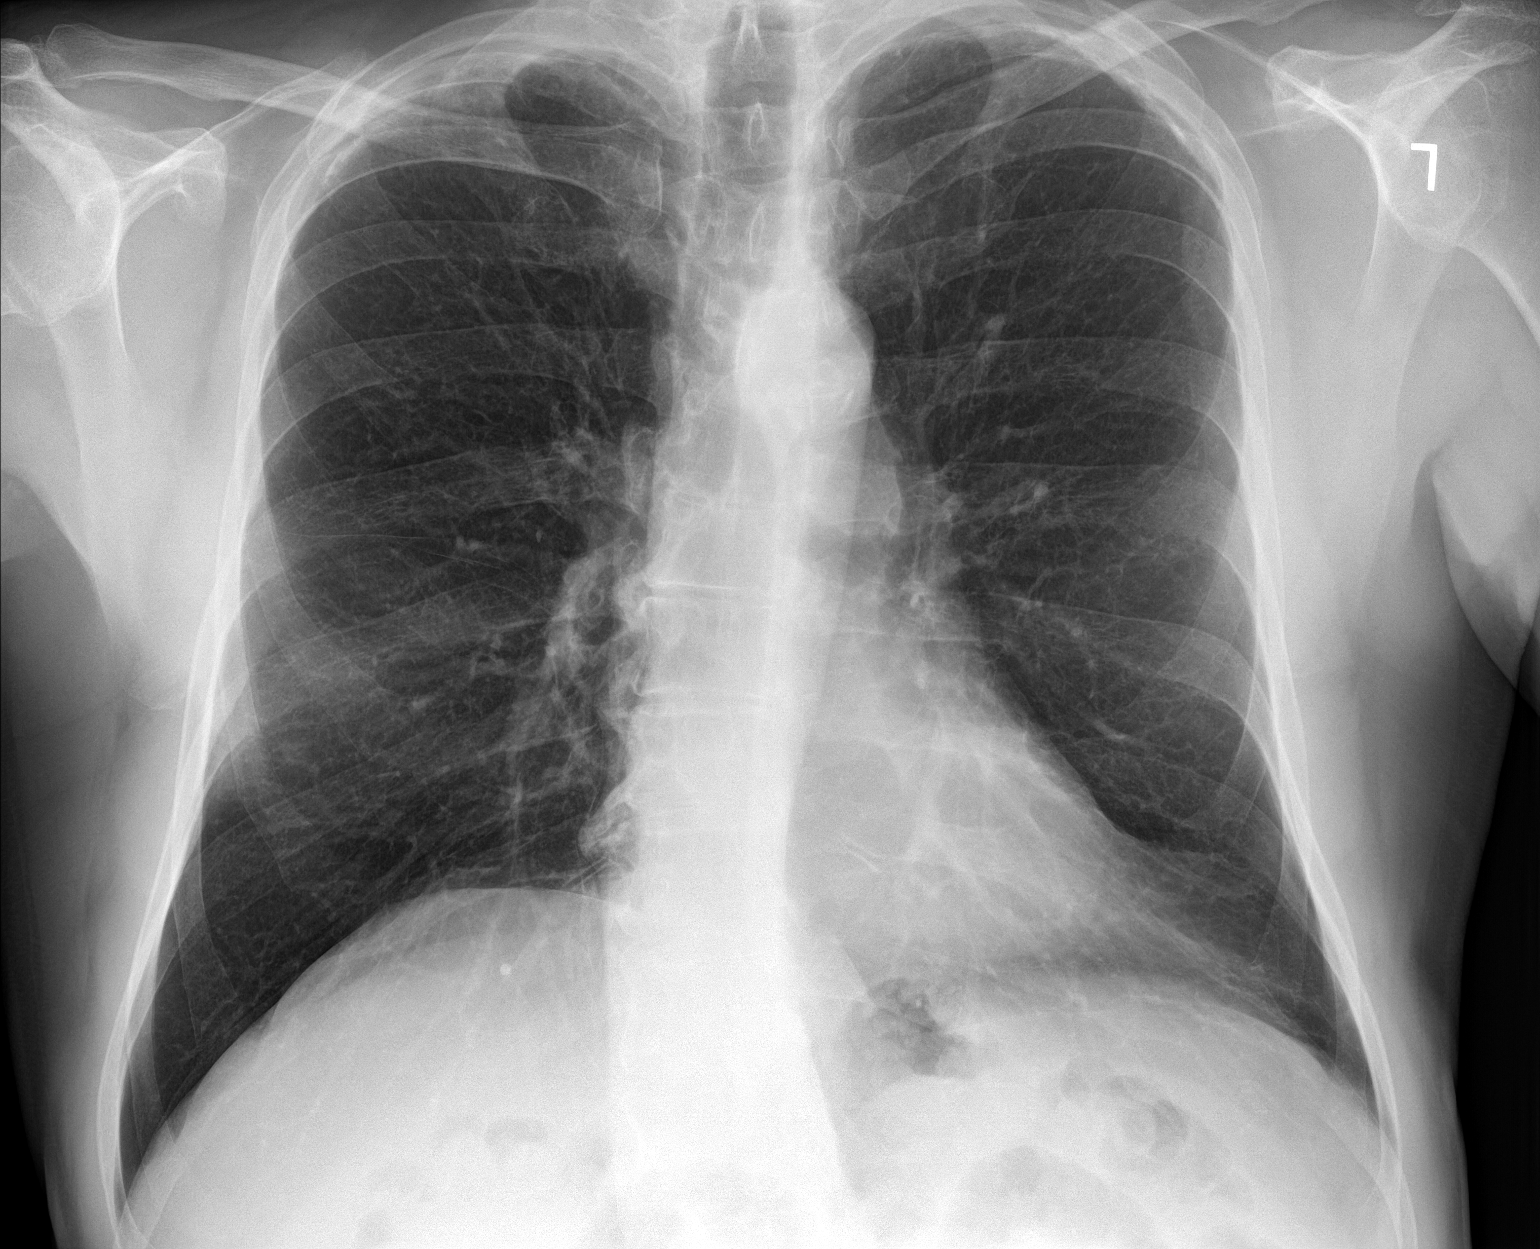

[chest lat]
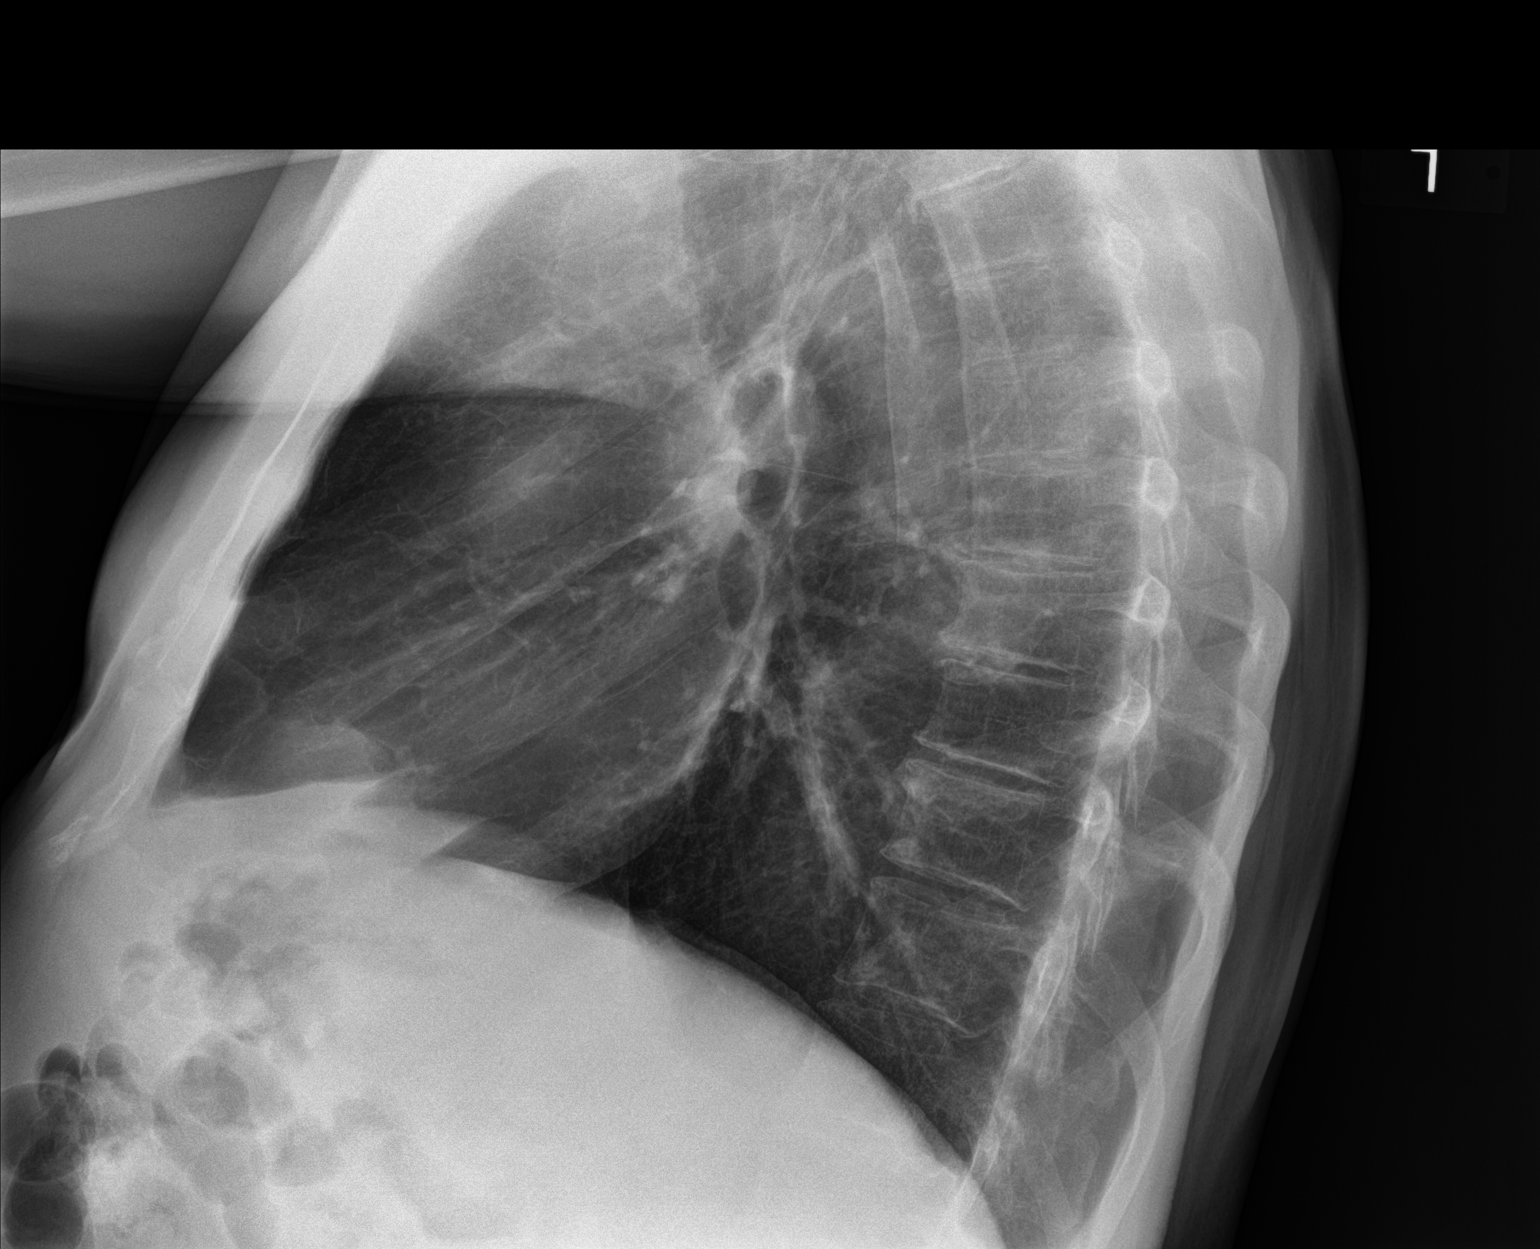

[2 of 2 positions shown; findings below may reference images not displayed]

FINDINGS: The lungs are well-expanded. There is no pneumothorax or pleural
effusion. There is no alveolar infiltrate. There is stable biapical
pleural thickening. The heart and pulmonary vascularity are normal.
There is calcification in the wall of the aortic arch. The
mediastinum is normal in width. There is multilevel degenerative
disc disease of the thoracic spine with gentle dextrocurvature
centered in the lower thoracic spine.
IMPRESSION: There is no pneumonia nor other acute cardiopulmonary abnormality.

Aortic atherosclerosis.

## 2017-08-14 ENCOUNTER — Telehealth: Payer: Self-pay

## 2017-08-14 NOTE — Telephone Encounter (Signed)
Please let patient know- he has been on it for awhile

## 2017-08-14 NOTE — Telephone Encounter (Signed)
Insurance denied Testosterone

## 2017-08-15 ENCOUNTER — Other Ambulatory Visit: Payer: Self-pay | Admitting: Pediatrics

## 2017-08-15 ENCOUNTER — Telehealth: Payer: Self-pay | Admitting: Nurse Practitioner

## 2017-08-15 DIAGNOSIS — Z5181 Encounter for therapeutic drug level monitoring: Secondary | ICD-10-CM

## 2017-08-15 NOTE — Telephone Encounter (Signed)
Any headaches or lightheadedness?  Can he bring his machine in and have [place] check with his machine and check with our machine sometime in the next couple days?  I also want him to have a CBC drawn because he is on the testosterone.  This order in.

## 2017-08-15 NOTE — Telephone Encounter (Signed)
PT is calling back per MMM request in regards to his BP, he said that it has been staying around 160/60's  One time a day the top number has got down to 140's  Please advise

## 2017-08-15 NOTE — Telephone Encounter (Signed)
Has this been denied before?

## 2017-08-15 NOTE — Telephone Encounter (Signed)
Spoke with pt regarding bp readings and lab work Pt will come by office to ck bp

## 2017-08-15 NOTE — Telephone Encounter (Signed)
Can you please review and advise. Patient of MMM

## 2017-08-16 DIAGNOSIS — K219 Gastro-esophageal reflux disease without esophagitis: Secondary | ICD-10-CM | POA: Diagnosis not present

## 2017-08-16 DIAGNOSIS — Z79899 Other long term (current) drug therapy: Secondary | ICD-10-CM | POA: Diagnosis not present

## 2017-08-16 DIAGNOSIS — Z86010 Personal history of colonic polyps: Secondary | ICD-10-CM | POA: Diagnosis not present

## 2017-08-16 NOTE — Telephone Encounter (Signed)
Not that I know of  Maybe formulary changed

## 2017-08-20 ENCOUNTER — Ambulatory Visit: Payer: Medicare HMO | Admitting: *Deleted

## 2017-08-20 ENCOUNTER — Other Ambulatory Visit: Payer: Self-pay | Admitting: *Deleted

## 2017-08-20 ENCOUNTER — Other Ambulatory Visit: Payer: Medicare HMO

## 2017-08-20 VITALS — BP 149/78 | HR 58

## 2017-08-20 DIAGNOSIS — Z5181 Encounter for therapeutic drug level monitoring: Secondary | ICD-10-CM

## 2017-08-20 DIAGNOSIS — I10 Essential (primary) hypertension: Secondary | ICD-10-CM

## 2017-08-20 LAB — CBC WITH DIFFERENTIAL/PLATELET
Basophils Absolute: 0.1 10*3/uL (ref 0.0–0.2)
Basos: 1 %
EOS (ABSOLUTE): 0.4 10*3/uL (ref 0.0–0.4)
EOS: 4 %
HEMATOCRIT: 41.6 % (ref 37.5–51.0)
Hemoglobin: 13.4 g/dL (ref 13.0–17.7)
IMMATURE GRANULOCYTES: 1 %
Immature Grans (Abs): 0 10*3/uL (ref 0.0–0.1)
LYMPHS ABS: 1.4 10*3/uL (ref 0.7–3.1)
Lymphs: 16 %
MCH: 27.3 pg (ref 26.6–33.0)
MCHC: 32.2 g/dL (ref 31.5–35.7)
MCV: 85 fL (ref 79–97)
MONOS ABS: 1 10*3/uL — AB (ref 0.1–0.9)
Monocytes: 12 %
NEUTROS PCT: 66 %
Neutrophils Absolute: 5.6 10*3/uL (ref 1.4–7.0)
PLATELETS: 321 10*3/uL (ref 150–450)
RBC: 4.91 x10E6/uL (ref 4.14–5.80)
RDW: 17.4 % — AB (ref 12.3–15.4)
WBC: 8.5 10*3/uL (ref 3.4–10.8)

## 2017-08-20 NOTE — Progress Notes (Signed)
Patient was told to bring his BP monitor in and let us compare readings. He has been getting high readings at home. BP with our machine 149/78 and with his monitor it was 174/80. His machine is about 74 years old. I checked it with a manuel cuff and his BP was 144/70. He plans to purchase a new machine this week.Patient went to the lab to have a CBC drawn.

## 2017-08-23 NOTE — Progress Notes (Signed)
FYI

## 2017-09-03 DIAGNOSIS — L570 Actinic keratosis: Secondary | ICD-10-CM | POA: Diagnosis not present

## 2017-09-03 DIAGNOSIS — L820 Inflamed seborrheic keratosis: Secondary | ICD-10-CM | POA: Diagnosis not present

## 2017-09-03 DIAGNOSIS — Z85828 Personal history of other malignant neoplasm of skin: Secondary | ICD-10-CM | POA: Diagnosis not present

## 2017-09-03 DIAGNOSIS — D485 Neoplasm of uncertain behavior of skin: Secondary | ICD-10-CM | POA: Diagnosis not present

## 2017-09-03 DIAGNOSIS — L819 Disorder of pigmentation, unspecified: Secondary | ICD-10-CM | POA: Diagnosis not present

## 2017-09-05 DIAGNOSIS — D122 Benign neoplasm of ascending colon: Secondary | ICD-10-CM | POA: Diagnosis not present

## 2017-09-05 DIAGNOSIS — K219 Gastro-esophageal reflux disease without esophagitis: Secondary | ICD-10-CM | POA: Diagnosis not present

## 2017-09-05 DIAGNOSIS — D123 Benign neoplasm of transverse colon: Secondary | ICD-10-CM | POA: Diagnosis not present

## 2017-09-05 DIAGNOSIS — K648 Other hemorrhoids: Secondary | ICD-10-CM | POA: Diagnosis not present

## 2017-09-05 DIAGNOSIS — D125 Benign neoplasm of sigmoid colon: Secondary | ICD-10-CM | POA: Diagnosis not present

## 2017-09-05 DIAGNOSIS — K635 Polyp of colon: Secondary | ICD-10-CM | POA: Diagnosis not present

## 2017-09-05 DIAGNOSIS — Z86010 Personal history of colonic polyps: Secondary | ICD-10-CM | POA: Diagnosis not present

## 2017-09-05 DIAGNOSIS — Z1211 Encounter for screening for malignant neoplasm of colon: Secondary | ICD-10-CM | POA: Diagnosis not present

## 2017-09-20 DIAGNOSIS — R7989 Other specified abnormal findings of blood chemistry: Secondary | ICD-10-CM | POA: Diagnosis not present

## 2017-09-20 DIAGNOSIS — R972 Elevated prostate specific antigen [PSA]: Secondary | ICD-10-CM | POA: Diagnosis not present

## 2017-09-20 DIAGNOSIS — N401 Enlarged prostate with lower urinary tract symptoms: Secondary | ICD-10-CM | POA: Diagnosis not present

## 2017-09-21 ENCOUNTER — Other Ambulatory Visit: Payer: Self-pay | Admitting: Nurse Practitioner

## 2017-09-21 DIAGNOSIS — K219 Gastro-esophageal reflux disease without esophagitis: Secondary | ICD-10-CM

## 2017-09-25 DIAGNOSIS — R972 Elevated prostate specific antigen [PSA]: Secondary | ICD-10-CM | POA: Diagnosis not present

## 2017-09-25 DIAGNOSIS — N400 Enlarged prostate without lower urinary tract symptoms: Secondary | ICD-10-CM | POA: Diagnosis not present

## 2017-10-12 DIAGNOSIS — M5416 Radiculopathy, lumbar region: Secondary | ICD-10-CM | POA: Diagnosis not present

## 2017-10-12 DIAGNOSIS — M150 Primary generalized (osteo)arthritis: Secondary | ICD-10-CM | POA: Diagnosis not present

## 2017-10-12 DIAGNOSIS — L4059 Other psoriatic arthropathy: Secondary | ICD-10-CM | POA: Diagnosis not present

## 2017-10-12 DIAGNOSIS — M25562 Pain in left knee: Secondary | ICD-10-CM | POA: Diagnosis not present

## 2017-10-12 DIAGNOSIS — M25561 Pain in right knee: Secondary | ICD-10-CM | POA: Diagnosis not present

## 2017-10-12 DIAGNOSIS — E663 Overweight: Secondary | ICD-10-CM | POA: Diagnosis not present

## 2017-10-12 DIAGNOSIS — Z79899 Other long term (current) drug therapy: Secondary | ICD-10-CM | POA: Diagnosis not present

## 2017-10-12 DIAGNOSIS — M25551 Pain in right hip: Secondary | ICD-10-CM | POA: Diagnosis not present

## 2017-10-12 DIAGNOSIS — M2550 Pain in unspecified joint: Secondary | ICD-10-CM | POA: Diagnosis not present

## 2017-10-16 ENCOUNTER — Other Ambulatory Visit: Payer: Self-pay | Admitting: Nurse Practitioner

## 2017-10-16 DIAGNOSIS — K219 Gastro-esophageal reflux disease without esophagitis: Secondary | ICD-10-CM

## 2017-11-09 ENCOUNTER — Encounter: Payer: Self-pay | Admitting: Nurse Practitioner

## 2017-11-09 ENCOUNTER — Ambulatory Visit (INDEPENDENT_AMBULATORY_CARE_PROVIDER_SITE_OTHER): Payer: Medicare HMO | Admitting: Nurse Practitioner

## 2017-11-09 VITALS — BP 121/69 | HR 64 | Temp 97.0°F | Ht 67.0 in | Wt 188.0 lb

## 2017-11-09 DIAGNOSIS — E291 Testicular hypofunction: Secondary | ICD-10-CM

## 2017-11-09 DIAGNOSIS — I700 Atherosclerosis of aorta: Secondary | ICD-10-CM | POA: Diagnosis not present

## 2017-11-09 DIAGNOSIS — R609 Edema, unspecified: Secondary | ICD-10-CM

## 2017-11-09 DIAGNOSIS — N401 Enlarged prostate with lower urinary tract symptoms: Secondary | ICD-10-CM

## 2017-11-09 DIAGNOSIS — Z23 Encounter for immunization: Secondary | ICD-10-CM

## 2017-11-09 DIAGNOSIS — F321 Major depressive disorder, single episode, moderate: Secondary | ICD-10-CM

## 2017-11-09 DIAGNOSIS — E785 Hyperlipidemia, unspecified: Secondary | ICD-10-CM

## 2017-11-09 DIAGNOSIS — I10 Essential (primary) hypertension: Secondary | ICD-10-CM | POA: Diagnosis not present

## 2017-11-09 DIAGNOSIS — Z6829 Body mass index (BMI) 29.0-29.9, adult: Secondary | ICD-10-CM

## 2017-11-09 DIAGNOSIS — K219 Gastro-esophageal reflux disease without esophagitis: Secondary | ICD-10-CM

## 2017-11-09 DIAGNOSIS — J4520 Mild intermittent asthma, uncomplicated: Secondary | ICD-10-CM

## 2017-11-09 MED ORDER — PANTOPRAZOLE SODIUM 40 MG PO TBEC: 40.0000 mg | DELAYED_RELEASE_TABLET | Freq: Two times a day (BID) | ORAL | 1 refills | Status: DC

## 2017-11-09 MED ORDER — TESTOSTERONE 40.5 MG/2.5GM (1.62%) TD GEL: 1.0000 "application " | Freq: Every day | TRANSDERMAL | 2 refills | Status: DC

## 2017-11-09 MED ORDER — SUCRALFATE 1 G PO TABS: ORAL_TABLET | ORAL | 1 refills | Status: DC

## 2017-11-09 MED ORDER — FUROSEMIDE 20 MG PO TABS: 20.0000 mg | ORAL_TABLET | Freq: Every day | ORAL | 1 refills | Status: DC

## 2017-11-09 MED ORDER — PREGABALIN 50 MG PO CAPS: 50.0000 mg | ORAL_CAPSULE | Freq: Every day | ORAL | 1 refills | Status: DC

## 2017-11-09 MED ORDER — ESCITALOPRAM OXALATE 20 MG PO TABS: 20.0000 mg | ORAL_TABLET | Freq: Every day | ORAL | 1 refills | Status: DC

## 2017-11-09 MED ORDER — OLMESARTAN MEDOXOMIL 40 MG PO TABS: 40.0000 mg | ORAL_TABLET | Freq: Every day | ORAL | 0 refills | Status: DC

## 2017-11-09 MED ORDER — ATORVASTATIN CALCIUM 40 MG PO TABS: 40.0000 mg | ORAL_TABLET | Freq: Every evening | ORAL | 1 refills | Status: DC

## 2017-11-09 NOTE — Progress Notes (Signed)
 Subjective:    Patient ID: Rodney Hernandez, male    DOB: 03/02/1945, 72 y.o.   MRN: [id number]   Chief Complaint: Medical management of chronic issues  HPI:  1. Essential hypertension  No c/o chest pain, sob or headache. Does not check blood pressure at home. BP Readings from Last 3 Encounters:  08/20/17 (!) 149/78  08/07/17 (!) 158/78  05/07/17 126/61     2. Aortic atherosclerosis (HCC)  Seen on chest xray- we are keeping watch  3. Mild intermittent chronic asthma without complication  Has wheezing most days.   4. Gastroesophageal reflux disease without esophagitis  Takes protonix daily-- works well most days to keep symptoms under control  5. Benign prostatic hyperplasia with lower urinary tract symptoms, symptom details unspecified  saw urology on 09/20/17. They are looking at a suspicious prostate lesion and he is suppose to have MRI to evaluate. Had MRI on 09/26/17 and small lesion was visible. Urologist want sPSA checked and if has gone up from July then they want to do biopsy if it does not they are going to just watch it.  6. Peripheral edema  Mainly in lower ext and is not everyday. Usually resolves during the night  7. Hyperlipidemia with target LDL less than 100  Does not really watch diet. Stays active but does not exercise.  8.      BMI 29.0-29.9          No recent weight changes  Outpatient Encounter Medications as of 11/09/2017  Medication Sig  . albuterol (VENTOLIN HFA) 108 (90 Base) MCG/ACT inhaler INHALE 2 PUFFS INTO THE LUNGS EVERY 6 (SIX) HOURS AS NEEDED FOR WHEEZING OR SHORTNESS OF BREATH.  . atorvastatin (LIPITOR) 40 MG tablet Take 1 tablet (40 mg total) by mouth every evening.  . escitalopram (LEXAPRO) 20 MG tablet Take 1 tablet (20 mg total) by mouth daily.  . folic acid (FOLVITE) 1 MG tablet   . furosemide (LASIX) 20 MG tablet Take 1 tablet (20 mg total) by mouth daily.  . methotrexate 25 MG/ML injection Inject 50 mg/m2 as directed once a week.   .  olmesartan (BENICAR) 40 MG tablet Take 1 tablet (40 mg total) by mouth daily.  . pantoprazole (PROTONIX) 40 MG tablet Take 1 tablet (40 mg total) by mouth 2 (two) times daily.  . pregabalin (LYRICA) 50 MG capsule Take 50-100 mg by mouth daily.  . sucralfate (CARAFATE) 1 g tablet TAKE 1 TABLET BY MOUTH FOUR TIMES A DAY WITH MEALS AND AT BEDTIME.  . Testosterone (ANDROGEL) 40.5 MG/2.5GM (1.62%) GEL Apply 1 application topically daily.  . traMADol (ULTRAM) 50 MG tablet Take 50 mg by mouth 2 (two) times daily.      New complaints: None today  Social history: Brings one of his daughters with him to every appointment.   Review of Systems  Constitutional: Negative for activity change and appetite change.  HENT: Negative.   Eyes: Negative for pain.  Respiratory: Negative for shortness of breath.   Cardiovascular: Negative for chest pain, palpitations and leg swelling.  Gastrointestinal: Negative for abdominal pain.  Endocrine: Negative for polydipsia.  Genitourinary: Negative.   Skin: Negative for rash.  Neurological: Negative for dizziness, weakness and headaches.  Hematological: Does not bruise/bleed easily.  Psychiatric/Behavioral: Negative.   All other systems reviewed and are negative.      Objective:   Physical Exam  Constitutional: He is oriented to person, place, and time. He appears well-developed and well-nourished.  HENT:  Head:   Normocephalic.  Nose: Nose normal.  Mouth/Throat: Oropharynx is clear and moist.  Eyes: Pupils are equal, round, and reactive to light. EOM are normal.  Neck: Normal range of motion and phonation normal. Neck supple. No JVD present. Carotid bruit is not present. No thyroid mass and no thyromegaly present.  Cardiovascular: Normal rate and regular rhythm.  Pulmonary/Chest: Effort normal and breath sounds normal. No respiratory distress.  Abdominal: Soft. Normal appearance, normal aorta and bowel sounds are normal. There is no tenderness.    Musculoskeletal: Normal range of motion.  Lymphadenopathy:    He has no cervical adenopathy.  Neurological: He is alert and oriented to person, place, and time.  Skin: Skin is warm and dry.  Psychiatric: He has a normal mood and affect. His behavior is normal. Judgment and thought content normal.  Nursing note and vitals reviewed.     BP 121/69   Pulse 64   Temp (!) 97 F (36.1 C) (Oral)   Ht 5' 7" (1.702 m)   Wt 188 lb (85.3 kg)   BMI 29.44 kg/m      Assessment & Plan:  Rodney Hernandez comes in today with chief complaint of Medical Management of Chronic Issues   Diagnosis and orders addressed:  1. Essential hypertension Low sodium diet - CMP14+EGFR - olmesartan (BENICAR) 40 MG tablet; Take 1 tablet (40 mg total) by mouth daily.  Dispense: 90 tablet; Refill: 0  2. Aortic atherosclerosis (HCC) Currently watching  3. Mild intermittent chronic asthma without complication  4. Gastroesophageal reflux disease without esophagitis Avoid spicy foods Do not eat 2 hours prior to bedtime - sucralfate (CARAFATE) 1 g tablet; TAKE 1 TABLET BY MOUTH FOUR TIMES A DAY WITH MEALS AND AT BEDTIME.  Dispense: 120 tablet; Refill: 1 - pantoprazole (PROTONIX) 40 MG tablet; Take 1 tablet (40 mg total) by mouth 2 (two) times daily.  Dispense: 180 tablet; Refill: 1  5. Benign prostatic hyperplasia with lower urinary tract symptoms, symptom details unspecified Will let urologist know if PSA changes - PSA, total and free  6. Peripheral edema Elevate legs when sitting - furosemide (LASIX) 20 MG tablet; Take 1 tablet (20 mg total) by mouth daily.  Dispense: 90 tablet; Refill: 1  7. Hyperlipidemia with target LDL less than 100 Low fat diet - Lipid panel - atorvastatin (LIPITOR) 40 MG tablet; Take 1 tablet (40 mg total) by mouth every evening.  Dispense: 90 tablet; Refill: 1  8. BMI 29.0-29.9,adult Discussed diet and exercise for person with BMI >25 Will recheck weight in 3-6 months  9.  Depression, major, single episode, moderate (HCC) Stress management - escitalopram (LEXAPRO) 20 MG tablet; Take 1 tablet (20 mg total) by mouth daily.  Dispense: 90 tablet; Refill: 1  10. Hypogonadism in male  - Testosterone (ANDROGEL) 40.5 MG/2.5GM (1.62%) GEL; Apply 1 application topically daily.  Dispense: 7.5 g; Refill: 2   Labs pending Health Maintenance reviewed Diet and exercise encouraged  Follow up plan: 3 months   Mary-Margaret Weng, FNP  

## 2017-11-09 NOTE — Patient Instructions (Signed)
Stress and Stress Management Stress is a normal reaction to life events. It is what you feel when life demands more than you are used to or more than you can handle. Some stress can be useful. For example, the stress reaction can help you catch the last bus of the day, study for a test, or meet a deadline at work. But stress that occurs too often or for too long can cause problems. It can affect your emotional health and interfere with relationships and normal daily activities. Too much stress can weaken your immune system and increase your risk for physical illness. If you already have a medical problem, stress can make it worse. What are the causes? All sorts of life events may cause stress. An event that causes stress for one person may not be stressful for another person. Major life events commonly cause stress. These may be positive or negative. Examples include losing your job, moving into a new home, getting married, having a baby, or losing a loved one. Less obvious life events may also cause stress, especially if they occur day after day or in combination. Examples include working long hours, driving in traffic, caring for children, being in debt, or being in a difficult relationship. What are the signs or symptoms? Stress may cause emotional symptoms including, the following:  Anxiety. This is feeling worried, afraid, on edge, overwhelmed, or out of control.  Anger. This is feeling irritated or impatient.  Depression. This is feeling sad, down, helpless, or guilty.  Difficulty focusing, remembering, or making decisions.  Stress may cause physical symptoms, including the following:  Aches and pains. These may affect your head, neck, back, stomach, or other areas of your body.  Tight muscles or clenched jaw.  Low energy or trouble sleeping.  Stress may cause unhealthy behaviors, including the following:  Eating to feel better (overeating) or skipping meals.  Sleeping too little,  too much, or both.  Working too much or putting off tasks (procrastination).  Smoking, drinking alcohol, or using drugs to feel better.  How is this diagnosed? Stress is diagnosed through an assessment by your health care provider. Your health care provider will ask questions about your symptoms and any stressful life events.Your health care provider will also ask about your medical history and may order blood tests or other tests. Certain medical conditions and medicine can cause physical symptoms similar to stress. Mental illness can cause emotional symptoms and unhealthy behaviors similar to stress. Your health care provider may refer you to a mental health professional for further evaluation. How is this treated? Stress management is the recommended treatment for stress.The goals of stress management are reducing stressful life events and coping with stress in healthy ways. Techniques for reducing stressful life events include the following:  Stress identification. Self-monitor for stress and identify what causes stress for you. These skills may help you to avoid some stressful events.  Time management. Set your priorities, keep a calendar of events, and learn to say "no." These tools can help you avoid making too many commitments.  Techniques for coping with stress include the following:  Rethinking the problem. Try to think realistically about stressful events rather than ignoring them or overreacting. Try to find the positives in a stressful situation rather than focusing on the negatives.  Exercise. Physical exercise can release both physical and emotional tension. The key is to find a form of exercise you enjoy and do it regularly.  Relaxation techniques. These relax the body and  mind. Examples include yoga, meditation, tai chi, biofeedback, deep breathing, progressive muscle relaxation, listening to music, being out in nature, journaling, and other hobbies. Again, the key is to find  one or more that you enjoy and can do regularly.  Healthy lifestyle. Eat a balanced diet, get plenty of sleep, and do not smoke. Avoid using alcohol or drugs to relax.  Strong support network. Spend time with family, friends, or other people you enjoy being around.Express your feelings and talk things over with someone you trust.  Counseling or talktherapy with a mental health professional may be helpful if you are having difficulty managing stress on your own. Medicine is typically not recommended for the treatment of stress.Talk to your health care provider if you think you need medicine for symptoms of stress. Follow these instructions at home:  Keep all follow-up visits as directed by your health care provider.  Take all medicines as directed by your health care provider. Contact a health care provider if:  Your symptoms get worse or you start having new symptoms.  You feel overwhelmed by your problems and can no longer manage them on your own. Get help right away if:  You feel like hurting yourself or someone else. This information is not intended to replace advice given to you by your health care provider. Make sure you discuss any questions you have with your health care provider. Document Released: 06/21/2000 Document Revised: 06/03/2015 Document Reviewed: 08/20/2012 Elsevier Interactive Patient Education  2017 [employer] Inc[employer]  

## 2017-11-10 LAB — CMP14+EGFR
ALK PHOS: 99 IU/L (ref 39–117)
ALT: 26 IU/L (ref 0–44)
AST: 21 IU/L (ref 0–40)
Albumin/Globulin Ratio: 2.1 (ref 1.2–2.2)
Albumin: 4.4 g/dL (ref 3.5–4.8)
BUN/Creatinine Ratio: 15 (ref 10–24)
BUN: 18 mg/dL (ref 8–27)
Bilirubin Total: 0.4 mg/dL (ref 0.0–1.2)
CO2: 25 mmol/L (ref 20–29)
CREATININE: 1.22 mg/dL (ref 0.76–1.27)
Calcium: 10.2 mg/dL (ref 8.6–10.2)
Chloride: 102 mmol/L (ref 96–106)
GFR calc Af Amer: 67 mL/min/{1.73_m2} (ref >59)
GFR calc non Af Amer: 58 mL/min/{1.73_m2} — ABNORMAL LOW (ref >59)
GLOBULIN, TOTAL: 2.1 g/dL (ref 1.5–4.5)
Glucose: 88 mg/dL (ref 65–99)
POTASSIUM: 4.3 mmol/L (ref 3.5–5.2)
SODIUM: 141 mmol/L (ref 134–144)
Total Protein: 6.5 g/dL (ref 6.0–8.5)

## 2017-11-10 LAB — PSA, TOTAL AND FREE
PROSTATE SPECIFIC AG, SERUM: 7.5 ng/mL — AB (ref 0.0–4.0)
PSA FREE PCT: 19.9 %
PSA FREE: 1.49 ng/mL

## 2017-11-10 LAB — LIPID PANEL
CHOL/HDL RATIO: 2.4 ratio (ref 0.0–5.0)
CHOLESTEROL TOTAL: 115 mg/dL (ref 100–199)
HDL: 48 mg/dL (ref >39)
LDL CALC: 50 mg/dL (ref 0–99)
TRIGLYCERIDES: 87 mg/dL (ref 0–149)
VLDL Cholesterol Cal: 17 mg/dL (ref 5–40)

## 2017-12-04 ENCOUNTER — Telehealth: Payer: Self-pay

## 2017-12-04 NOTE — Telephone Encounter (Signed)
Insurance denied prior auth for Testosterone  I am going to put denial on your desk so you can see reasons

## 2017-12-04 NOTE — Telephone Encounter (Signed)
He has been on this awhile- what will they pay fr

## 2018-01-18 DIAGNOSIS — Z79899 Other long term (current) drug therapy: Secondary | ICD-10-CM | POA: Diagnosis not present

## 2018-01-18 DIAGNOSIS — M150 Primary generalized (osteo)arthritis: Secondary | ICD-10-CM | POA: Diagnosis not present

## 2018-01-18 DIAGNOSIS — M25561 Pain in right knee: Secondary | ICD-10-CM | POA: Diagnosis not present

## 2018-01-18 DIAGNOSIS — M2550 Pain in unspecified joint: Secondary | ICD-10-CM | POA: Diagnosis not present

## 2018-01-18 DIAGNOSIS — E663 Overweight: Secondary | ICD-10-CM | POA: Diagnosis not present

## 2018-01-18 DIAGNOSIS — Z6827 Body mass index (BMI) 27.0-27.9, adult: Secondary | ICD-10-CM | POA: Diagnosis not present

## 2018-01-18 DIAGNOSIS — M5416 Radiculopathy, lumbar region: Secondary | ICD-10-CM | POA: Diagnosis not present

## 2018-01-18 DIAGNOSIS — L4059 Other psoriatic arthropathy: Secondary | ICD-10-CM | POA: Diagnosis not present

## 2018-01-18 DIAGNOSIS — M25562 Pain in left knee: Secondary | ICD-10-CM | POA: Diagnosis not present

## 2018-02-12 ENCOUNTER — Ambulatory Visit (INDEPENDENT_AMBULATORY_CARE_PROVIDER_SITE_OTHER): Payer: Medicare HMO | Admitting: Nurse Practitioner

## 2018-02-12 ENCOUNTER — Encounter: Payer: Self-pay | Admitting: Nurse Practitioner

## 2018-02-12 VITALS — BP 143/82 | HR 65 | Temp 97.1°F | Ht 67.0 in | Wt 188.0 lb

## 2018-02-12 DIAGNOSIS — I700 Atherosclerosis of aorta: Secondary | ICD-10-CM

## 2018-02-12 DIAGNOSIS — E785 Hyperlipidemia, unspecified: Secondary | ICD-10-CM

## 2018-02-12 DIAGNOSIS — R609 Edema, unspecified: Secondary | ICD-10-CM

## 2018-02-12 DIAGNOSIS — L409 Psoriasis, unspecified: Secondary | ICD-10-CM | POA: Diagnosis not present

## 2018-02-12 DIAGNOSIS — L4050 Arthropathic psoriasis, unspecified: Secondary | ICD-10-CM | POA: Diagnosis not present

## 2018-02-12 DIAGNOSIS — C61 Malignant neoplasm of prostate: Secondary | ICD-10-CM | POA: Diagnosis not present

## 2018-02-12 DIAGNOSIS — K219 Gastro-esophageal reflux disease without esophagitis: Secondary | ICD-10-CM | POA: Diagnosis not present

## 2018-02-12 DIAGNOSIS — I10 Essential (primary) hypertension: Secondary | ICD-10-CM

## 2018-02-12 DIAGNOSIS — F321 Major depressive disorder, single episode, moderate: Secondary | ICD-10-CM

## 2018-02-12 DIAGNOSIS — E291 Testicular hypofunction: Secondary | ICD-10-CM

## 2018-02-12 MED ORDER — ESCITALOPRAM OXALATE 20 MG PO TABS: 20.0000 mg | ORAL_TABLET | Freq: Every day | ORAL | 1 refills | Status: DC

## 2018-02-12 MED ORDER — PREGABALIN 50 MG PO CAPS: 50.0000 mg | ORAL_CAPSULE | Freq: Every day | ORAL | 1 refills | Status: DC

## 2018-02-12 MED ORDER — PANTOPRAZOLE SODIUM 40 MG PO TBEC: 40.0000 mg | DELAYED_RELEASE_TABLET | Freq: Two times a day (BID) | ORAL | 1 refills | Status: DC

## 2018-02-12 MED ORDER — OLMESARTAN MEDOXOMIL 40 MG PO TABS: 40.0000 mg | ORAL_TABLET | Freq: Every day | ORAL | 0 refills | Status: DC

## 2018-02-12 MED ORDER — TESTOSTERONE 40.5 MG/2.5GM (1.62%) TD GEL: 1.0000 "application " | Freq: Every day | TRANSDERMAL | 2 refills | Status: DC

## 2018-02-12 MED ORDER — ATORVASTATIN CALCIUM 40 MG PO TABS: 40.0000 mg | ORAL_TABLET | Freq: Every evening | ORAL | 1 refills | Status: DC

## 2018-02-12 MED ORDER — SUCRALFATE 1 G PO TABS: ORAL_TABLET | ORAL | 1 refills | Status: DC

## 2018-02-12 MED ORDER — FUROSEMIDE 20 MG PO TABS: 20.0000 mg | ORAL_TABLET | Freq: Every day | ORAL | 1 refills | Status: DC

## 2018-02-12 NOTE — Patient Instructions (Signed)
Prostate Cancer ° °The prostate is a male gland that helps make semen. Prostate cancer is when abnormal cells grow in this gland. °Follow these instructions at home: °· Take over-the-counter and prescription medicines only as told by your doctor. °· Eat a healthy diet. °· Get plenty of sleep. °· Ask your doctor for help to find a support group for men with prostate cancer. °· Keep all follow-up visits as told by your doctor. This is important. °· If you have to go to the hospital, let your cancer doctor (oncologist) know. °· Touch, hold, hug, and caress your partner to continue to show sexual feelings. °Contact a doctor if: °· You have trouble peeing (urinating). °· You have blood in your pee (urine). °· You have pain in your hips, back, or chest. °Get help right away if: °· You have weakness in your legs. °· You lose feeling (have numbness) in your legs. °· You cannot control your pee or your poop (stool). °· You have trouble breathing. °· You have sudden pain in your chest. °· You have chills or a fever. °Summary °· The prostate is a male gland that helps make semen. Prostate cancer is when abnormal cells grow in this gland. °· Ask your doctor for help to find a support group for men with prostate cancer. °· Contact a doctor if you have problems peeing or have any new pain that you did not have before. °This information is not intended to replace advice given to you by your health care provider. Make sure you discuss any questions you have with your health care provider. °Document Released: 12/14/2008 Document Revised: 09/07/2015 Document Reviewed: 09/06/2015 °Elsevier Interactive Patient Education © 2019 Else[employer] Inc. ° °

## 2018-02-12 NOTE — Progress Notes (Signed)
Subjective:    Patient ID: Rodney Hernandez, male    DOB: 27-Jun-1945, 73 y.o.   MRN: [id number]   Chief Complaint: medical management of chronic issues  HPI:  1. Essential hypertension  No c/o chest pain, sob or headache. Does not check blood pressure at home. BP Readings from Last 3 Encounters:  11/09/17 121/69  08/20/17 (!) 149/78  08/07/17 (!) 158/78     2. Gastroesophageal reflux disease without esophagitis  Takes protonix daily. See GI because ehe still has symptoms despite trying alll meds  3. Aortic atherosclerosis ([place])  Seen on chest xray we are monitoring  4. Psoriasis  Is seen every 6 months by dermatoly  5. Psoriatic arthritis [facility])  See rheumatology every 4 months . He is currently on methotrexate.- no side effects.    Outpatient Encounter Medications as of 02/12/2018  Medication Sig  . albuterol (VENTOLIN HFA) 108 (90 Base) MCG/ACT inhaler INHALE 2 PUFFS INTO THE LUNGS EVERY 6 (SIX) HOURS AS NEEDED FOR WHEEZING OR SHORTNESS OF BREATH.  Marland Kitchen atorvastatin (LIPITOR) 40 MG tablet Take 1 tablet (40 mg total) by mouth every evening.  . escitalopram (LEXAPRO) 20 MG tablet Take 1 tablet (20 mg total) by mouth daily.  . folic acid (FOLVITE) 1 MG tablet   . furosemide (LASIX) 20 MG tablet Take 1 tablet (20 mg total) by mouth daily.  . methotrexate 25 MG/ML injection Inject 50 mg/m2 as directed once a week.   . olmesartan (BENICAR) 40 MG tablet Take 1 tablet (40 mg total) by mouth daily.  . pantoprazole (PROTONIX) 40 MG tablet Take 1 tablet (40 mg total) by mouth 2 (two) times daily.  . pregabalin (LYRICA) 50 MG capsule Take 1-2 capsules (50-100 mg total) by mouth daily.  . sucralfate (CARAFATE) 1 g tablet TAKE 1 TABLET BY MOUTH FOUR TIMES A DAY WITH MEALS AND AT BEDTIME.  Marland Kitchen Testosterone (ANDROGEL) 40.5 MG/2.5GM (1.62%) GEL Apply 1 application topically daily.  . traMADol (ULTRAM) 50 MG tablet Take 50 mg by mouth 2 (two) times daily.    New complaints: PSA elevated and he was  referred to urology. Dx with prostate cancer. Has follow up in April. They want PSA repeated today.  Social history: Lives alone but has 2 daughters that are very active in his life.    Review of Systems  Constitutional: Negative for activity change and appetite change.  HENT: Negative.   Eyes: Negative for pain.  Respiratory: Negative for shortness of breath.   Cardiovascular: Negative for chest pain, palpitations and leg swelling.  Gastrointestinal: Negative for abdominal pain.  Endocrine: Negative for polydipsia.  Genitourinary: Negative.   Skin: Negative for rash.  Neurological: Negative for dizziness, weakness and headaches.  Hematological: Does not bruise/bleed easily.  Psychiatric/Behavioral: Negative.   All other systems reviewed and are negative.      Objective:   Physical Exam Vitals signs and nursing note reviewed.  Constitutional:      Appearance: Normal appearance. He is well-developed.  HENT:     Head: Normocephalic.     Nose: Nose normal.  Eyes:     Pupils: Pupils are equal, round, and reactive to light.  Neck:     Musculoskeletal: Normal range of motion and neck supple.     Thyroid: No thyroid mass or thyromegaly.     Vascular: No carotid bruit or JVD.     Trachea: Phonation normal.  Cardiovascular:     Rate and Rhythm: Normal rate and regular rhythm.  Pulmonary:  Effort: Pulmonary effort is normal. No respiratory distress.     Breath sounds: Normal breath sounds.  Abdominal:     General: Bowel sounds are normal.     Palpations: Abdomen is soft.     Tenderness: There is no abdominal tenderness.  Musculoskeletal: Normal range of motion.  Lymphadenopathy:     Cervical: No cervical adenopathy.  Skin:    General: Skin is warm and dry.  Neurological:     Mental Status: He is alert and oriented to person, place, and time.  Psychiatric:        Behavior: Behavior normal.        Thought Content: Thought content normal.        Judgment: Judgment  normal.    BP (!) 143/82   Pulse 65   Temp (!) 97.1 F (36.2 C) (Oral)   Ht _0  (1.702 m)   Wt 188 lb (85.3 kg)   BMI 29.44 kg/m       Assessment & Plan:  Rodney Hernandez comes in today with chief complaint of Medical Management of Chronic Issues   Diagnosis and orders addressed:  1. Essential hypertension Low sodium diet - CMP14+EGFR - Lipid panel - olmesartan (BENICAR) 40 MG tablet; Take 1 tablet (40 mg total) by mouth daily.  Dispense: 90 tablet; Refill: 0  2. Gastroesophageal reflux disease without esophagitis Avoid spicy foods Do not eat 2 hours prior to bedtime - pantoprazole (PROTONIX) 40 MG tablet; Take 1 tablet (40 mg total) by mouth 2 (two) times daily.  Dispense: 180 tablet; Refill: 1 - sucralfate (CARAFATE) 1 g tablet; TAKE 1 TABLET BY MOUTH FOUR TIMES A DAY WITH MEALS AND AT BEDTIME.  Dispense: 120 tablet; Refill: 1  3. Aortic atherosclerosis ([place]) Will check at next visit  4. Psoriasis Keep follow up with dermatology  5. Psoriatic arthritis ([place]) Keep follow up with rheumatology  6. Prostate cancer ([place]) Will recheck PSA today - PSA, total and free  7. Depression, major, single episode, moderate (HCC) Stress management - escitalopram (LEXAPRO) 20 MG tablet; Take 1 tablet (20 mg total) by mouth daily.  Dispense: 90 tablet; Refill: 1  8. Hyperlipidemia with target LDL less than 100 Low fat diet - atorvastatin (LIPITOR) 40 MG tablet; Take 1 tablet (40 mg total) by mouth every evening.  Dispense: 90 tablet; Refill: 1  9. Peripheral edema Elevate legs when sitting - furosemide (LASIX) 20 MG tablet; Take 1 tablet (20 mg total) by mouth daily.  Dispense: 90 tablet; Refill: 1  10. Hypogonadism in male - Testosterone (ANDROGEL) 40.5 MG/2.5GM (1.62%) GEL; Apply 1 application topically daily.  Dispense: 7.5 g; Refill: 2   Labs pending Health Maintenance reviewed Diet and exercise encouraged  Follow up plan: 3 months   Mary-Margaret Hassell Done, FNP

## 2018-02-13 LAB — CMP14+EGFR
ALT: 23 IU/L (ref 0–44)
AST: 19 IU/L (ref 0–40)
Albumin/Globulin Ratio: 2.4 — ABNORMAL HIGH (ref 1.2–2.2)
Albumin: 4.5 g/dL (ref 3.7–4.7)
Alkaline Phosphatase: 99 IU/L (ref 39–117)
BUN/Creatinine Ratio: 9 — ABNORMAL LOW (ref 10–24)
BUN: 10 mg/dL (ref 8–27)
Bilirubin Total: 0.5 mg/dL (ref 0.0–1.2)
CALCIUM: 10.8 mg/dL — AB (ref 8.6–10.2)
CO2: 21 mmol/L (ref 20–29)
Chloride: 101 mmol/L (ref 96–106)
Creatinine, Ser: 1.13 mg/dL (ref 0.76–1.27)
GFR calc Af Amer: 74 mL/min/{1.73_m2} (ref >59)
GFR calc non Af Amer: 64 mL/min/{1.73_m2} (ref >59)
GLUCOSE: 86 mg/dL (ref 65–99)
Globulin, Total: 1.9 g/dL (ref 1.5–4.5)
Potassium: 4.2 mmol/L (ref 3.5–5.2)
Sodium: 141 mmol/L (ref 134–144)
TOTAL PROTEIN: 6.4 g/dL (ref 6.0–8.5)

## 2018-02-13 LAB — PSA, TOTAL AND FREE
PSA, Free Pct: 17.7 %
PSA, Free: 1.61 ng/mL
Prostate Specific Ag, Serum: 9.1 ng/mL — ABNORMAL HIGH (ref 0.0–4.0)

## 2018-02-13 LAB — LIPID PANEL
CHOLESTEROL TOTAL: 108 mg/dL (ref 100–199)
Chol/HDL Ratio: 2.5 ratio (ref 0.0–5.0)
HDL: 43 mg/dL (ref >39)
LDL Calculated: 44 mg/dL (ref 0–99)
TRIGLYCERIDES: 103 mg/dL (ref 0–149)
VLDL CHOLESTEROL CAL: 21 mg/dL (ref 5–40)

## 2018-02-14 ENCOUNTER — Telehealth: Payer: Self-pay | Admitting: Nurse Practitioner

## 2018-02-15 NOTE — Telephone Encounter (Signed)
Pt aware.

## 2018-03-06 DIAGNOSIS — Z85828 Personal history of other malignant neoplasm of skin: Secondary | ICD-10-CM | POA: Diagnosis not present

## 2018-03-06 DIAGNOSIS — D0439 Carcinoma in situ of skin of other parts of face: Secondary | ICD-10-CM | POA: Diagnosis not present

## 2018-03-06 DIAGNOSIS — D485 Neoplasm of uncertain behavior of skin: Secondary | ICD-10-CM | POA: Diagnosis not present

## 2018-03-06 DIAGNOSIS — D0462 Carcinoma in situ of skin of left upper limb, including shoulder: Secondary | ICD-10-CM | POA: Diagnosis not present

## 2018-03-06 DIAGNOSIS — L821 Other seborrheic keratosis: Secondary | ICD-10-CM | POA: Diagnosis not present

## 2018-03-06 DIAGNOSIS — L570 Actinic keratosis: Secondary | ICD-10-CM | POA: Diagnosis not present

## 2018-03-14 DIAGNOSIS — C44329 Squamous cell carcinoma of skin of other parts of face: Secondary | ICD-10-CM | POA: Diagnosis not present

## 2018-03-14 DIAGNOSIS — C44629 Squamous cell carcinoma of skin of left upper limb, including shoulder: Secondary | ICD-10-CM | POA: Diagnosis not present

## 2018-03-26 ENCOUNTER — Other Ambulatory Visit: Payer: Self-pay | Admitting: Nurse Practitioner

## 2018-04-11 ENCOUNTER — Encounter: Payer: Medicare HMO | Admitting: *Deleted

## 2018-05-07 ENCOUNTER — Other Ambulatory Visit: Payer: Self-pay

## 2018-05-07 ENCOUNTER — Ambulatory Visit (INDEPENDENT_AMBULATORY_CARE_PROVIDER_SITE_OTHER): Payer: Medicare HMO | Admitting: *Deleted

## 2018-05-07 ENCOUNTER — Encounter: Payer: Self-pay | Admitting: *Deleted

## 2018-05-07 DIAGNOSIS — Z0000 Encounter for general adult medical examination without abnormal findings: Secondary | ICD-10-CM

## 2018-05-07 NOTE — Progress Notes (Addendum)
MEDICARE ANNUAL WELLNESS VISIT  05/07/2018  Telephone Visit Disclaimer This Medicare AWV was conducted by telephone due to national recommendations for restrictions regarding the COVID-19 Pandemic (e.g. social distancing).  I verified, using two identifiers, that I am speaking with Rodney Hernandez or their authorized healthcare agent. I discussed the limitations, risks, security, and privacy concerns of performing an evaluation and management service by telephone and the potential availability of an in-person appointment in the future. The patient expressed understanding and agreed to proceed.   Subjective:   Rodney Hernandez is a 73 y.o. male patient of Rodney Hernandez, [place] who had a Medicare Annual Wellness Visit today via telephone. 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  He is widowed and lives alone. he has 2 children and 6 grandchildren. he reports that he is socially active and does interact with friends and/or family regularly. he is minimally physically active currently due to issues with seasonal allergies and psoriatic arthritis pain.  He plans to increase his physical activity as tolerated in the next month.  He enjoys working around his yard and golfing when able.  Patient states he no longer drives because he does not have a car.  He states he lives in town in [place] and has no trouble getting things he needs because he is able to walk wherever he needs to go.  Has has friends, neighbors, and daughters who provide transportation for him when needed.  Patient Care Team: Rodney Pretty, FNP as PCP - General (Nurse Practitioner) Prudencio Pair as Physician Assistant (Emergency Medicine) Henreitta Leber Marisue Brooklyn, MD as Referring Physician (Urology) Andres Shad Julieta Bellini, MD as Referring Physician (Gastroenterology)  Hospital Utilization Over the Past 12 Months: # of hospitalizations or ER visits: 0 # of surgeries: 0  Review of Systems    Patient reports that his overall  health is unchanged compared to last year.    Review of Systems:  All systems negative reported by patient.  Pain Assessment Pain Score: 0-No pain     Current Medications & Allergies (verified) Allergies as of 05/07/2018      Reactions   Nsaids Shortness Of Breath   Aspirin Other (See Comments)   Affects patient breathing   Fluticasone Rash   Of face   Morphine And Related Hives, Itching   Sulfa Antibiotics       Medication List       Accurate as of May 07, 2018 12:43 PM. Always use your most recent med list.        albuterol 108 (90 Base) MCG/ACT inhaler Commonly known as:  Ventolin HFA TAKE 2 PUFFS BY MOUTH EVERY 6 HOURS AS NEEDED FOR WHEEZE OR SHORTNESS OF BREATH   atorvastatin 40 MG tablet Commonly known as:  LIPITOR Take 1 tablet (40 mg total) by mouth every evening.   escitalopram 20 MG tablet Commonly known as:  Lexapro Take 1 tablet (20 mg total) by mouth daily.   folic acid 1 MG tablet Commonly known as:  FOLVITE   furosemide 20 MG tablet Commonly known as:  LASIX Take 1 tablet (20 mg total) by mouth daily.   methotrexate 25 MG/ML injection Inject 50 mg/m2 as directed once a week.   olmesartan 40 MG tablet Commonly known as:  BENICAR Take 1 tablet (40 mg total) by mouth daily.   pantoprazole 40 MG tablet Commonly known as:  PROTONIX Take 1 tablet (40 mg total) by mouth 2 (two) times daily.   pregabalin 50 MG capsule  Commonly known as:  LYRICA Take 1-2 capsules (50-100 mg total) by mouth daily.   sucralfate 1 g tablet Commonly known as:  CARAFATE TAKE 1 TABLET BY MOUTH FOUR TIMES A DAY WITH MEALS AND AT BEDTIME.   Testosterone 40.5 MG/2.5GM (1.62%) Gel Commonly known as:  AndroGel Apply 1 application topically daily.   traMADol 50 MG tablet Commonly known as:  ULTRAM Take 50 mg by mouth 2 (two) times daily as needed.       History (reviewed): Past Medical History:  Diagnosis Date  . Arthritis    psoriatic arthritis and  osteo arthritis  . Asthma   . Collapse of lung   . Colon polyps   . Eczema   . GERD (gastroesophageal reflux disease)   . Hypertension   . Seasonal allergies   . Vertigo    Past Surgical History:  Procedure Laterality Date  . collapsed lung    . HERNIA REPAIR    . PROSTATE SURGERY     Family History  Problem Relation Age of Onset  . Lung cancer Father   . Prostate cancer Father   . Stomach cancer Maternal Grandmother   . Prostate cancer Paternal Uncle   . Cervical cancer Daughter   . Psoriasis Daughter   . Arthritis Daughter        psoriatic arthritis   Social History   Socioeconomic History  . Marital status: Widowed    Spouse name: Not on file  . Number of children: 2  . Years of education: 19  . Highest education level: High school graduate  Occupational History  . Occupation: Retired    Comment: worked in a Writer for 33 years  Social Needs  . Financial resource strain: Not hard at all  . Food insecurity:    Worry: Never true    Inability: Never true  . Transportation needs:    Medical: No    Non-medical: No  Tobacco Use  . Smoking status: Former Smoker    Packs/day: 2.00    Years: 22.00    Pack years: 44.00    Types: Cigarettes    Last attempt to quit: 01/09/1981    Years since quitting: 37.3  . Smokeless tobacco: Never Used  Substance and Sexual Activity  . Alcohol use: No  . Drug use: No  . Sexual activity: Not Currently  Lifestyle  . Physical activity:    Days per week: 0 days    Minutes per session: 0 min  . Stress: Not at all  Relationships  . Social connections:    Talks on phone: More than three times a week    Gets together: More than three times a week    Attends religious service: More than 4 times per year    Active member of club or organization: No    Attends meetings of clubs or organizations: Never    Relationship status: Widowed  Other Topics Concern  . Not on file  Social History Narrative   Widowed, 2 children    Right handed   12th grade   3 cups daily    Activities of Daily Living In your present state of health, do you have any difficulty performing the following activities: 05/07/2018  Hearing? Y  Comment Trouble hearing higher pitches  Vision? N  Difficulty concentrating or making decisions? Y  Comment Trouble concentrating at times  Walking or climbing stairs? N  Dressing or bathing? N  Doing errands, shopping? N  Comment Has neighbor or  daughter provide transportation - no longer has a Counsellor and eating ? N  Using the Toilet? N  In the past six months, have you accidently leaked urine? N  Do you have problems with loss of bowel control? N  Managing your Medications? N  Managing your Finances? N  Housekeeping or managing your Housekeeping? N  Some recent data might be hidden    Patient literacy    Exercise Current Exercise Habits: The patient does not participate in regular exercise at present, Exercise limited by: respiratory conditions(s);orthopedic condition(s)(Respiratory problems when exercising outdoors currently due to seasonal allergies+)  Diet Patient reports consuming 3 meals a day and 1 snack(s) a day Patient reports that his primary diet is: Low fat- tries to include mostly vegetables, whole grains, and lean proteins. Patient reports that he does have regular access to food.   Depression Screen PHQ 2/9 Scores 05/07/2018 02/12/2018 11/09/2017 08/07/2017 05/07/2017 04/09/2017 11/21/2016  PHQ - 2 Score 1 2 2 1 1  0 0  PHQ- 9 Score - 6 15 - - - -     Fall Risk Fall Risk  05/07/2018 02/12/2018 08/07/2017 05/07/2017 04/09/2017  Falls in the past year? 1 0 No No No  Number falls in past yr: 0 - - - -  Injury with Fall? 0 - - - -  Follow up Education provided;Falls prevention discussed - - - -     Objective:      Last 3 BP Readings Last Weight Last BMI  BP Readings from Last 3 Encounters:  02/12/18 (!) 143/82  11/09/17 121/69  08/20/17 (!) 149/78   Wt Readings  from Last 3 Encounters:  02/12/18 188 lb (85.3 kg)  11/09/17 188 lb (85.3 kg)  08/07/17 184 lb 4 oz (83.6 kg)   BMI Readings from Last 1 Encounters:  02/12/18 29.44 kg/m    *Unable to obtain current vital signs, weight, and BMI due to telephone visit type  Rodney Hernandez seemed alert and oriented and he participated appropriately during our telephone visit.  Advanced Directives 05/07/2018 05/07/2018 04/09/2017  Does Patient Have a Medical Advance Directive? - No Yes  Type of Advance Directive - - [place] in Chart? - - No - copy requested  Would patient like information on creating a medical advance directive? Yes (MAU/Ambulatory/Procedural Areas - Information given) No - Patient declined -    Hearing/Vision  . Keino did not seem to have difficulty with hearing/understanding during the telephone conversation . Reports that he has not had a formal eye exam by an eye care professional within the past year . Reports that he has not had a formal hearing evaluation within the past year.  Had hearing evaluated 4-5 years ago.  States he was told he needed hearing aids, but never got them.  Will discuss with Rodney Pretty, FNP if he wishes to have hearing evaluated again in the future. *Unable to fully assess hearing and vision during telephone visit type  Cognitive Function: 6CIT Screen 05/07/2018  What Year? 0 points  What month? 0 points  What time? 0 points  Count back from 20 0 points  Months in reverse 0 points  Repeat phrase 0 points  Total Score 0    Normal Cognitive Function Screening: Yes (Normal:0-7, Significant for Dysfunction: >8)  Immunization & Health Maintenance Record Immunization History  Administered Date(s) Administered  . Influenza, High Dose Seasonal PF 10/20/2013, 10/11/2015, 10/11/2016, 11/09/2017  .  Influenza,inj,Quad PF,6+ Mos 10/14/2014  . Pneumococcal Conjugate-13 03/02/2014  . Pneumococcal  Polysaccharide-23 11/10/2011  . Td 03/02/2014    Health Maintenance  Topic Date Due  . INFLUENZA VACCINE  08/10/2018  . COLONOSCOPY  09/05/2020  . TETANUS/TDAP  03/02/2024  . Hepatitis C Screening  Completed  . PNA vac Low Risk Adult  Completed       Assessment:   This is a routine wellness examination for Leander Tout.  Health Maintenance: Due or Overdue There are no preventive care reminders to display for this patient.  Rosevelt Luu does not need a referral for Community Assistance: Care Management:   no Social Work:    no Prescription Assistance:  no Nutrition/Diabetes Education:  no   Plan:    Personalized Goals    None    Goals    . Exercise 3x per week (30 min per time)     Increase walking to 30-45 minutes at least 3 days per week.          Personalized Health Maintenance & Screening Recommendations  Shingrix series  Lung Cancer Screening Recommended: no (Low Dose CT Chest recommended if Age 32-80 years, 30 pack-year currently smoking OR have quit w/in past 15 years) Hepatitis C Screening recommended: completed 05/13/15   Advanced Directives: Written information was mailed to patient per his request.  Referrals & Orders Placed: n/a  Follow-up Plan . Follow-up with Rodney Pretty, FNP as planned . Increase exercise as tolerated to at least 3 times per week for 30 minutes each session. . Schedule eye exam  I have personally reviewed and noted the following in the patient's chart:   . Medical and social history . Use of alcohol, tobacco or illicit drugs  . Current medications and supplements . Functional ability and status . Nutritional status . Physical activity . Advanced directives . List of other physicians . Hospitalizations, surgeries, and ER visits in previous 12 months . Vitals . Screenings to include cognitive, depression, and falls . Referrals and appointments  In addition, I have reviewed and discussed with Rodney Hernandez  certain preventive protocols, quality metrics, and best practice recommendations. A written personalized care plan for preventive services as well as general preventive health recommendations is available and can be mailed to the patient at his request.       Nolberto Hanlon, RN  05/07/2018  I have reviewed and agree with the above AWV documentation.   Mary-Margaret Hassell Done, FNP

## 2018-05-07 NOTE — Patient Instructions (Signed)
  Rodney Hernandez , Thank you for taking time to talk with me for your Medicare Wellness Visit. I appreciate your ongoing commitment to your health goals. Please review the following plan we discussed and let me know if I can assist you in the future.   Please remember to schedule your eye exam.  Consider getting the Shingles vaccine series in the future  These are the goals we discussed: Goals    . Exercise 3x per week (30 min per time)     Increase walking to 30-45 minutes at least 3 days per week.       This is a list of the screening recommended for you and due dates:  Health Maintenance  Topic Date Due  . Flu Shot  08/10/2018  . Colon Cancer Screening  09/05/2020  . Tetanus Vaccine  03/02/2024  .  Hepatitis C: One time screening is recommended by Center for Disease Control  (CDC) for  adults born from [birth year] through 1967.   Completed  . Pneumonia vaccines  Completed

## 2018-05-13 ENCOUNTER — Encounter: Payer: Self-pay | Admitting: Nurse Practitioner

## 2018-05-13 ENCOUNTER — Ambulatory Visit (INDEPENDENT_AMBULATORY_CARE_PROVIDER_SITE_OTHER): Payer: Medicare HMO | Admitting: Nurse Practitioner

## 2018-05-13 ENCOUNTER — Other Ambulatory Visit: Payer: Self-pay

## 2018-05-13 DIAGNOSIS — R609 Edema, unspecified: Secondary | ICD-10-CM

## 2018-05-13 DIAGNOSIS — F321 Major depressive disorder, single episode, moderate: Secondary | ICD-10-CM

## 2018-05-13 DIAGNOSIS — E291 Testicular hypofunction: Secondary | ICD-10-CM | POA: Diagnosis not present

## 2018-05-13 DIAGNOSIS — I700 Atherosclerosis of aorta: Secondary | ICD-10-CM

## 2018-05-13 DIAGNOSIS — N401 Enlarged prostate with lower urinary tract symptoms: Secondary | ICD-10-CM

## 2018-05-13 DIAGNOSIS — J4520 Mild intermittent asthma, uncomplicated: Secondary | ICD-10-CM | POA: Diagnosis not present

## 2018-05-13 DIAGNOSIS — E785 Hyperlipidemia, unspecified: Secondary | ICD-10-CM | POA: Diagnosis not present

## 2018-05-13 DIAGNOSIS — L4050 Arthropathic psoriasis, unspecified: Secondary | ICD-10-CM | POA: Diagnosis not present

## 2018-05-13 DIAGNOSIS — K219 Gastro-esophageal reflux disease without esophagitis: Secondary | ICD-10-CM

## 2018-05-13 DIAGNOSIS — I10 Essential (primary) hypertension: Secondary | ICD-10-CM | POA: Diagnosis not present

## 2018-05-13 MED ORDER — FUROSEMIDE 20 MG PO TABS: 20.0000 mg | ORAL_TABLET | Freq: Every day | ORAL | 1 refills | Status: DC

## 2018-05-13 MED ORDER — ESCITALOPRAM OXALATE 20 MG PO TABS: 20.0000 mg | ORAL_TABLET | Freq: Every day | ORAL | 1 refills | Status: DC

## 2018-05-13 MED ORDER — ATORVASTATIN CALCIUM 40 MG PO TABS: 40.0000 mg | ORAL_TABLET | Freq: Every evening | ORAL | 1 refills | Status: DC

## 2018-05-13 MED ORDER — TESTOSTERONE 40.5 MG/2.5GM (1.62%) TD GEL: 1.0000 "application " | Freq: Every day | TRANSDERMAL | 2 refills | Status: DC

## 2018-05-13 MED ORDER — OLMESARTAN MEDOXOMIL 40 MG PO TABS: 40.0000 mg | ORAL_TABLET | Freq: Every day | ORAL | 1 refills | Status: DC

## 2018-05-13 MED ORDER — PANTOPRAZOLE SODIUM 40 MG PO TBEC: 40.0000 mg | DELAYED_RELEASE_TABLET | Freq: Two times a day (BID) | ORAL | 1 refills | Status: DC

## 2018-05-13 MED ORDER — PREGABALIN 50 MG PO CAPS: 50.0000 mg | ORAL_CAPSULE | Freq: Every day | ORAL | 1 refills | Status: DC

## 2018-05-13 NOTE — Progress Notes (Signed)
Virtual Visit via telephone Note  I connected with Rodney Hernandez on 05/13/18 at 11:05 AM by telephone and verified that I am speaking with the correct person using two identifiers. Rodney Hernandez is currently located at home and no one is currently with her during visit. The provider, Mary-Margaret Hassell Done, FNP is located in their office at time of visit.  I discussed the limitations, risks, security and privacy concerns of performing an evaluation and management service by telephone and the availability of in person appointments. I also discussed with the patient that there may be a patient responsible charge related to this service. The patient expressed understanding and agreed to proceed.   History and Present Illness:   Chief Complaint: Medical Management of Chronic Issues    HPI:  1. Essential hypertension No c/o chest pain, sob or headache. Does nor check blood pressure.  2. Hyperlipidemia with target LDL less than 100 Watches diet daily  3. Gastroesophageal reflux disease without esophagitis Is on protonix daily and is working well.  4. Aortic atherosclerosis (HCC) Seen on chest xray- no symptoms noted  5. Mild intermittent chronic asthma without complication Only has problems when he is busy outside.  6. Hypogonadism in male Takes testosterone daily  7. Peripheral edema Has swelling that resolves over night  8. Psoriatic arthritis ([place]) He hurts everyday. He is currently on methotrexate.  9. Benign prostatic hyperplasia with lower urinary tract symptoms, symptom details unspecified No problems voiding  10. depression Is still taking lexapro and has occasional episodes of depression, but over all is doing well.  Outpatient Encounter Medications as of 05/13/2018  Medication Sig  . albuterol (VENTOLIN HFA) 108 (90 Base) MCG/ACT inhaler TAKE 2 PUFFS BY MOUTH EVERY 6 HOURS AS NEEDED FOR WHEEZE OR SHORTNESS OF BREATH  . atorvastatin (LIPITOR) 40 MG tablet Take 1 tablet  (40 mg total) by mouth every evening.  . escitalopram (LEXAPRO) 20 MG tablet Take 1 tablet (20 mg total) by mouth daily.  . folic acid (FOLVITE) 1 MG tablet   . furosemide (LASIX) 20 MG tablet Take 1 tablet (20 mg total) by mouth daily.  . methotrexate 25 MG/ML injection Inject 50 mg/m2 as directed once a week.   . olmesartan (BENICAR) 40 MG tablet Take 1 tablet (40 mg total) by mouth daily.  . pantoprazole (PROTONIX) 40 MG tablet Take 1 tablet (40 mg total) by mouth 2 (two) times daily.  . pregabalin (LYRICA) 50 MG capsule Take 1-2 capsules (50-100 mg total) by mouth daily.  . sucralfate (CARAFATE) 1 g tablet TAKE 1 TABLET BY MOUTH FOUR TIMES A DAY WITH MEALS AND AT BEDTIME. (Patient taking differently: 2 (two) times daily as needed. TAKE 1 TABLET BY MOUTH FOUR TIMES A DAY WITH MEALS AND AT BEDTIME.)  . Testosterone (ANDROGEL) 40.5 MG/2.5GM (1.62%) GEL Apply 1 application topically daily.  . traMADol (ULTRAM) 50 MG tablet Take 50 mg by mouth 2 (two) times daily as needed.       New complaints: None today  Social history: Lives alone - his daughters check on him daily      Review of Systems  Constitutional: Negative for diaphoresis and weight loss.  Eyes: Negative for blurred vision, double vision and pain.  Respiratory: Negative for shortness of breath.   Cardiovascular: Negative for chest pain, palpitations, orthopnea and leg swelling.  Gastrointestinal: Negative for abdominal pain.  Skin: Negative for rash.  Neurological: Negative for dizziness, sensory change, loss of consciousness, weakness and headaches.  Endo/Heme/Allergies: Negative  for polydipsia. Does not bruise/bleed easily.  Psychiatric/Behavioral: Negative for memory loss. The patient does not have insomnia.   All other systems reviewed and are negative.    Observations/Objective: Lives alone but has 2 daughters that check on him.  Assessment and Plan: Rodney Hernandez comes in today with chief complaint of Medical  Management of Chronic Issues   Diagnosis and orders addressed:  1. Essential hypertension Low sodium diet - olmesartan (BENICAR) 40 MG tablet; Take 1 tablet (40 mg total) by mouth daily.  Dispense: 90 tablet; Refill: 1  2. Hyperlipidemia with target LDL less than 100 Low fat diet - atorvastatin (LIPITOR) 40 MG tablet; Take 1 tablet (40 mg total) by mouth every evening.  Dispense: 90 tablet; Refill: 1  3. Gastroesophageal reflux disease without esophagitis Avoid spicy foods Do not eat 2 hours prior to bedtime - pantoprazole (PROTONIX) 40 MG tablet; Take 1 tablet (40 mg total) by mouth 2 (two) times daily.  Dispense: 180 tablet; Refill: 1  4. Aortic atherosclerosis ([place]) Will repeat chest x ray this yesr  5. Mild intermittent chronic asthma without complication Wear a mask when outside  6. Hypogonadism in male - Testosterone (ANDROGEL) 40.5 MG/2.5GM (1.62%) GEL; Apply 1 application topically daily.  Dispense: 7.5 g; Refill: 2  7. Peripheral edema Elevate legs when sitting - furosemide (LASIX) 20 MG tablet; Take 1 tablet (20 mg total) by mouth daily.  Dispense: 90 tablet; Refill: 1  8. Psoriatic arthritis ([place]) Keep follow up with rheumatology  9. Benign prostatic hyperplasia with lower urinary tract symptoms, symptom details unspecified Report any voiding chnaging  10. Depression, major, single episode, moderate (HCC) Stress management - escitalopram (LEXAPRO) 20 MG tablet; Take 1 tablet (20 mg total) by mouth daily.  Dispense: 90 tablet; Refill: 1   Labs pending Health Maintenance reviewed Diet and exercise encouraged   Follow Up Instructions: 3 months    I discussed the assessment and treatment plan with the patient. The patient was provided an opportunity to ask questions and all were answered. The patient agreed with the plan and demonstrated an understanding of the instructions.   The patient was advised to call back or seek an in-person evaluation if the  symptoms worsen or if the condition fails to improve as anticipated.  The above assessment and management plan was discussed with the patient. The patient verbalized understanding of and has agreed to the management plan. Patient is aware to call the clinic if symptoms persist or worsen. Patient is aware when to return to the clinic for a follow-up visit. Patient educated on when it is appropriate to go to the emergency department.   Time call ended:  11:22  I provided 20 minutes of non-face-to-face time during this encounter.    Mary-Margaret Hassell Done, FNP

## 2018-06-18 ENCOUNTER — Other Ambulatory Visit: Payer: Self-pay | Admitting: Family Medicine

## 2018-06-18 DIAGNOSIS — M25562 Pain in left knee: Secondary | ICD-10-CM | POA: Diagnosis not present

## 2018-06-18 DIAGNOSIS — L4059 Other psoriatic arthropathy: Secondary | ICD-10-CM | POA: Diagnosis not present

## 2018-06-18 DIAGNOSIS — C61 Malignant neoplasm of prostate: Secondary | ICD-10-CM | POA: Diagnosis not present

## 2018-06-18 DIAGNOSIS — M25551 Pain in right hip: Secondary | ICD-10-CM | POA: Diagnosis not present

## 2018-06-18 DIAGNOSIS — M150 Primary generalized (osteo)arthritis: Secondary | ICD-10-CM | POA: Diagnosis not present

## 2018-06-18 DIAGNOSIS — M25561 Pain in right knee: Secondary | ICD-10-CM | POA: Diagnosis not present

## 2018-06-18 DIAGNOSIS — M2550 Pain in unspecified joint: Secondary | ICD-10-CM | POA: Diagnosis not present

## 2018-06-18 DIAGNOSIS — Z79899 Other long term (current) drug therapy: Secondary | ICD-10-CM | POA: Diagnosis not present

## 2018-06-18 DIAGNOSIS — M5416 Radiculopathy, lumbar region: Secondary | ICD-10-CM | POA: Diagnosis not present

## 2018-06-20 ENCOUNTER — Telehealth: Payer: Self-pay | Admitting: Nurse Practitioner

## 2018-06-20 ENCOUNTER — Other Ambulatory Visit: Payer: Self-pay

## 2018-06-20 ENCOUNTER — Other Ambulatory Visit: Payer: Medicare HMO

## 2018-06-20 DIAGNOSIS — E291 Testicular hypofunction: Secondary | ICD-10-CM

## 2018-06-20 MED ORDER — TESTOSTERONE 40.5 MG/2.5GM (1.62%) TD GEL: 1.0000 "application " | Freq: Every day | TRANSDERMAL | 2 refills | Status: DC

## 2018-06-20 NOTE — Telephone Encounter (Signed)
Noted  

## 2018-06-20 NOTE — Telephone Encounter (Signed)
PT is coming here shortly to do labs would like to pick up written rx for his Testerone cream he uses everyday

## 2018-06-20 NOTE — Telephone Encounter (Signed)
Rx printed and left up front for patient pick up

## 2018-06-25 ENCOUNTER — Telehealth: Payer: Self-pay | Admitting: Nurse Practitioner

## 2018-06-25 DIAGNOSIS — N401 Enlarged prostate with lower urinary tract symptoms: Secondary | ICD-10-CM | POA: Diagnosis not present

## 2018-06-25 DIAGNOSIS — R972 Elevated prostate specific antigen [PSA]: Secondary | ICD-10-CM | POA: Diagnosis not present

## 2018-06-25 LAB — SPECIMEN STATUS REPORT

## 2018-06-25 NOTE — Telephone Encounter (Signed)
Last labs faxed to [facility] [phone] ATTN: Beverlee Nims

## 2018-06-29 LAB — SPECIMEN STATUS REPORT

## 2018-06-29 LAB — PSA: Prostate Specific Ag, Serum: 7.5 ng/mL — ABNORMAL HIGH (ref 0.0–4.0)

## 2018-07-22 ENCOUNTER — Other Ambulatory Visit: Payer: Self-pay

## 2018-07-22 ENCOUNTER — Telehealth: Payer: Self-pay | Admitting: Nurse Practitioner

## 2018-07-22 NOTE — Telephone Encounter (Signed)
Appt changed to televisit.  Patient aware

## 2018-07-22 NOTE — Telephone Encounter (Signed)
Patient states that 2 weeks ago patient had a headache for 3 days, loss of appetite, no taste and fever. Has an appt tomorrow 7/14.  Would you like to do a telephone visit?

## 2018-07-22 NOTE — Telephone Encounter (Signed)
Please schedule for telephone visit tomorrow

## 2018-07-23 ENCOUNTER — Encounter: Payer: Self-pay | Admitting: Nurse Practitioner

## 2018-07-23 ENCOUNTER — Ambulatory Visit (INDEPENDENT_AMBULATORY_CARE_PROVIDER_SITE_OTHER): Payer: Medicare HMO | Admitting: Nurse Practitioner

## 2018-07-23 DIAGNOSIS — I10 Essential (primary) hypertension: Secondary | ICD-10-CM

## 2018-07-23 MED ORDER — HYDROCHLOROTHIAZIDE 25 MG PO TABS: 25.0000 mg | ORAL_TABLET | Freq: Every day | ORAL | 1 refills | Status: DC

## 2018-07-23 NOTE — Progress Notes (Addendum)
   Virtual Visit via telephone Note  I connected with Rodney Hernandez on 07/23/18 at 11:20 by telephone and verified that I am speaking with the correct person using two identifiers. Rodney Hernandez is currently located at home and no one is currently with her during visit. The provider, Mary-Margaret Hassell Done, FNP is located in their office at time of visit.  I discussed the limitations, risks, security and privacy concerns of performing an evaluation and management service by telephone and the availability of in person appointments. I also discussed with the patient that there may be a patient responsible charge related to this service. The patient expressed understanding and agreed to proceed.   History and Present Illness:   Chief Complaint: Hypertension   HPI Patient calls in today c/o blood pressure was running high 2-3 weeks ago. He says he woke up with  Migraine and lasted 2-3 days. Said he also had a high fever and lost of taste. During all og that his blood pressure was elevated. He has been watching blood pressure everyday. Gradually came down  To normal and over the last week it has been staying above 150-160.    Review of Systems  Eyes: Negative for blurred vision and double vision.  Respiratory: Negative.   Cardiovascular: Negative for chest pain, palpitations and leg swelling.  Genitourinary: Negative.   Neurological: Positive for headaches (but was 3 weeks ago). Negative for dizziness.  Psychiatric/Behavioral: Negative.   All other systems reviewed and are negative.    Observations/Objective: Alert and oriented- answers all questions appropriately No distress  Assessment and Plan: [name] in today with chief complaint of Hypertension   1. Essential hypertension Do not add salt to diet Keep diary or blood pressure Hold lasix for now - hydrochlorothiazide (HYDRODIURIL) 25 MG tablet; Take 1 tablet (25 mg total) by mouth daily.  Dispense: 90 tablet; Refill: 1    Follow Up Instructions: August 24 at 8AM    I discussed the assessment and treatment plan with the patient. The patient was provided an opportunity to ask questions and all were answered. The patient agreed with the plan and demonstrated an understanding of the instructions.   The patient was advised to call back or seek an in-person evaluation if the symptoms worsen or if the condition fails to improve as anticipated.  The above assessment and management plan was discussed with the patient. The patient verbalized understanding of and has agreed to the management plan. Patient is aware to call the clinic if symptoms persist or worsen. Patient is aware when to return to the clinic for a follow-up visit. Patient educated on when it is appropriate to go to the emergency department.   Time call ended:  11:30  I provided 10 minutes of non-face-to-face time during this encounter.    Mary-Margaret Hassell Done, FNP

## 2018-07-24 ENCOUNTER — Telehealth: Payer: Self-pay | Admitting: Nurse Practitioner

## 2018-07-24 NOTE — Telephone Encounter (Signed)
Take both hctz and olmesartan

## 2018-07-28 ENCOUNTER — Other Ambulatory Visit: Payer: Self-pay | Admitting: Nurse Practitioner

## 2018-07-28 DIAGNOSIS — K219 Gastro-esophageal reflux disease without esophagitis: Secondary | ICD-10-CM

## 2018-07-29 ENCOUNTER — Telehealth: Payer: Self-pay | Admitting: Nurse Practitioner

## 2018-07-29 ENCOUNTER — Ambulatory Visit (INDEPENDENT_AMBULATORY_CARE_PROVIDER_SITE_OTHER): Payer: Medicare HMO | Admitting: Nurse Practitioner

## 2018-07-29 ENCOUNTER — Encounter: Payer: Self-pay | Admitting: Nurse Practitioner

## 2018-07-29 ENCOUNTER — Telehealth: Payer: Self-pay | Admitting: *Deleted

## 2018-07-29 DIAGNOSIS — I10 Essential (primary) hypertension: Secondary | ICD-10-CM

## 2018-07-29 NOTE — Telephone Encounter (Signed)
Pt's daughter called to ask if pt is supposed to be on Furosemide and HCTZ Please advise

## 2018-07-29 NOTE — Progress Notes (Signed)
   Virtual Visit via telephone Note  I connected with Rodney Hernandez on 07/29/18 at 12:25 by telephone and verified that I am speaking with the correct person using two identifiers. Rodney Hernandez is currently located at home and no one is currently with her during visit. The provider, Mary-Margaret Hassell Done, FNP is located in their office at time of visit.  I discussed the limitations, risks, security and privacy concerns of performing an evaluation and management service by telephone and the availability of in person appointments. I also discussed with the patient that there may be a patient responsible charge related to this service. The patient expressed understanding and agreed to proceed.   History and Present Illness:   Chief Complaint: Hypertension   HPI Patient calls I today about his blood pressure. It has been high and he was put on hctz and stopped lasix yesterday. He wa ssuppose to stop lasix when we hadded HCTZ  His blood pressure has been going below 128 systolic.  He denies nay peripheral edema.  Today his blood pressure has ben normal today.    Review of Systems  Constitutional: Negative for diaphoresis and weight loss.  Eyes: Negative for blurred vision, double vision and pain.  Respiratory: Negative for shortness of breath.   Cardiovascular: Negative for chest pain, palpitations, orthopnea and leg swelling.  Gastrointestinal: Negative for abdominal pain.  Skin: Negative for rash.  Neurological: Negative for dizziness, sensory change, loss of consciousness, weakness and headaches.  Endo/Heme/Allergies: Negative for polydipsia. Does not bruise/bleed easily.  Psychiatric/Behavioral: Negative for memory loss. The patient does not have insomnia.   All other systems reviewed and are negative.    Observations/Objective: Alert and oriented No distress  Assessment and Plan:  Rodney Hernandez in today with chief complaint of Hypertension   1. Essential hypertension Continue  HCTZ and benicar contniue to hold lasix Watch sodium in diet Keep diary of blood pressure and let me know how doing.  Follow Up Instructions: 1 week    I discussed the assessment and treatment plan with the patient. The patient was provided an opportunity to ask questions and all were answered. The patient agreed with the plan and demonstrated an understanding of the instructions.   The patient was advised to call back or seek an in-person evaluation if the symptoms worsen or if the condition fails to improve as anticipated.  The above assessment and management plan was discussed with the patient. The patient verbalized understanding of and has agreed to the management plan. Patient is aware to call the clinic if symptoms persist or worsen. Patient is aware when to return to the clinic for a follow-up visit. Patient educated on when it is appropriate to go to the emergency department.   Time call ended:  12:35  I provided 10 minutes of non-face-to-face time during this encounter.    Mary-Margaret Hassell Done, FNP

## 2018-07-29 NOTE — Telephone Encounter (Signed)
appt scheduled Pt notified 

## 2018-08-02 ENCOUNTER — Telehealth: Payer: Self-pay | Admitting: Nurse Practitioner

## 2018-08-02 NOTE — Chronic Care Management (AMB) (Signed)
°  Chronic Care Management   Outreach Note  08/02/2018 Name: Rodney Hernandez MRN: [id number] DOB: 12-16-1945  Referred by: Chevis Pretty, FNP Reason for referral : Chronic Care Management (Third CCM outreach was unsuccessful.)   Third unsuccessful telephone outreach was attempted today. The patient was referred to the case management team for assistance with chronic care management and care coordination. The patient's primary care provider has been notified of our unsuccessful attempts to make or maintain contact with the patient. The care management team is pleased to engage with this patient at any time in the future should he/she be interested in assistance from the care management team.   Follow Up Plan: The care management team is available to follow up with the patient after provider conversation with the patient regarding recommendation for care management engagement and subsequent re-referral to the care management team.   [place]  [email or web address]   ??[phone]

## 2018-08-15 ENCOUNTER — Telehealth: Payer: Self-pay | Admitting: Nurse Practitioner

## 2018-08-15 NOTE — Telephone Encounter (Signed)
Patient states that he is drinking 6-8 bottles of water daily, has not tried any stool softeners at this time

## 2018-08-15 NOTE — Telephone Encounter (Signed)
Pt aware.

## 2018-08-15 NOTE — Telephone Encounter (Signed)
try miralax daily

## 2018-09-02 ENCOUNTER — Ambulatory Visit: Payer: Self-pay | Admitting: Nurse Practitioner

## 2018-09-03 DIAGNOSIS — L570 Actinic keratosis: Secondary | ICD-10-CM | POA: Diagnosis not present

## 2018-09-19 DIAGNOSIS — Z79899 Other long term (current) drug therapy: Secondary | ICD-10-CM | POA: Diagnosis not present

## 2018-09-19 DIAGNOSIS — M5416 Radiculopathy, lumbar region: Secondary | ICD-10-CM | POA: Diagnosis not present

## 2018-09-19 DIAGNOSIS — M150 Primary generalized (osteo)arthritis: Secondary | ICD-10-CM | POA: Diagnosis not present

## 2018-09-19 DIAGNOSIS — Z6826 Body mass index (BMI) 26.0-26.9, adult: Secondary | ICD-10-CM | POA: Diagnosis not present

## 2018-09-19 DIAGNOSIS — M25562 Pain in left knee: Secondary | ICD-10-CM | POA: Diagnosis not present

## 2018-09-19 DIAGNOSIS — M25561 Pain in right knee: Secondary | ICD-10-CM | POA: Diagnosis not present

## 2018-09-19 DIAGNOSIS — M2550 Pain in unspecified joint: Secondary | ICD-10-CM | POA: Diagnosis not present

## 2018-09-19 DIAGNOSIS — L4059 Other psoriatic arthropathy: Secondary | ICD-10-CM | POA: Diagnosis not present

## 2018-09-19 DIAGNOSIS — M25551 Pain in right hip: Secondary | ICD-10-CM | POA: Diagnosis not present

## 2018-09-27 ENCOUNTER — Ambulatory Visit: Payer: Commercial Managed Care - HMO | Admitting: Cardiology

## 2018-09-29 ENCOUNTER — Other Ambulatory Visit: Payer: Self-pay | Admitting: Nurse Practitioner

## 2018-09-29 DIAGNOSIS — I10 Essential (primary) hypertension: Secondary | ICD-10-CM

## 2018-10-10 ENCOUNTER — Other Ambulatory Visit: Payer: Self-pay

## 2018-10-11 ENCOUNTER — Telehealth: Payer: Self-pay | Admitting: Nurse Practitioner

## 2018-10-11 ENCOUNTER — Ambulatory Visit (INDEPENDENT_AMBULATORY_CARE_PROVIDER_SITE_OTHER): Payer: Medicare HMO

## 2018-10-11 DIAGNOSIS — Z23 Encounter for immunization: Secondary | ICD-10-CM

## 2018-10-11 DIAGNOSIS — E291 Testicular hypofunction: Secondary | ICD-10-CM

## 2018-10-11 MED ORDER — TESTOSTERONE 40.5 MG/2.5GM (1.62%) TD GEL: 1.0000 "application " | Freq: Every day | TRANSDERMAL | 2 refills | Status: DC

## 2018-10-14 ENCOUNTER — Other Ambulatory Visit: Payer: Self-pay | Admitting: Nurse Practitioner

## 2018-10-14 DIAGNOSIS — E785 Hyperlipidemia, unspecified: Secondary | ICD-10-CM

## 2018-11-05 ENCOUNTER — Telehealth: Payer: Self-pay | Admitting: Cardiology

## 2018-11-05 ENCOUNTER — Other Ambulatory Visit: Payer: Self-pay

## 2018-11-05 ENCOUNTER — Encounter: Payer: Self-pay | Admitting: Cardiology

## 2018-11-05 ENCOUNTER — Telehealth (INDEPENDENT_AMBULATORY_CARE_PROVIDER_SITE_OTHER): Payer: Commercial Managed Care - HMO | Admitting: Cardiology

## 2018-11-05 VITALS — BP 147/61 | HR 80 | Ht 67.0 in | Wt 180.0 lb

## 2018-11-05 DIAGNOSIS — R0602 Shortness of breath: Secondary | ICD-10-CM

## 2018-11-05 DIAGNOSIS — I10 Essential (primary) hypertension: Secondary | ICD-10-CM

## 2018-11-05 NOTE — Progress Notes (Signed)
Virtual Visit via Telephone Note   This visit type was conducted due to national recommendations for restrictions regarding the COVID-19 Pandemic (e.g. social distancing) in an effort to limit this patient's exposure and mitigate transmission in our community.  Due to his co-morbid illnesses, this patient is at least at moderate risk for complications without adequate follow up.  This format is felt to be most appropriate for this patient at this time.  The patient did not have access to video technology/had technical difficulties with video requiring transitioning to audio format only (telephone).  All issues noted in this document were discussed and addressed.  No physical exam could be performed with this format.  Please refer to the patient's chart for his  consent to telehealth for [facility].   Date:  11/05/2018   ID:  Rodney Hernandez, DOB 1945/05/06, MRN [id number]  Patient Location: Home Provider Location: Home  PCP:  Chevis Pretty, FNP  Cardiologist:  Dr [name]  Electrophysiologist:  None   Evaluation Performed:  New patient appointment  Chief Complaint:  SOB  History of Present Illness:    Rodney Hernandez is a 73 y.o. male seen as a new paitent, was last seen by our practice over 3 years ago    1. SOB -last visit he reported someDOE with activity, example playing golf he gets significantly SOB. Used to tolerate quite well. Exertion is also limited by chronic leg pains as well.  - denies any chest pain. No LE edema. No orthopnea. No palpitations  CAD risk factors: HTN, Hyperlipidemia, former smoker x 20. Multiple family members on father's side with heart conditions.    08/2015 echo with LVEF 35-00%, normal diastolic function, mild MR, mild RV enlargement, PASP 42 - he did not go for stress test as we had ordered 3 years ago, did not follow up  - SOB is stable since last visit. Can have some chest tightness at times, he reports significant productive cough  as well.   2. HTN - compliant with meds   The patient does not have symptoms concerning for COVID-19 infection (fever, chills, cough, or new shortness of breath).    Past Medical History:  Diagnosis Date  . Arthritis    psoriatic arthritis and osteo arthritis  . Asthma   . Collapse of lung   . Colon polyps   . Eczema   . GERD (gastroesophageal reflux disease)   . Hypertension   . Seasonal allergies   . Vertigo    Past Surgical History:  Procedure Laterality Date  . collapsed lung    . HERNIA REPAIR    . PROSTATE SURGERY       No outpatient medications have been marked as taking for the 11/05/18 encounter (Telemedicine) with Arnoldo Lenis, MD.     Allergies:   Nsaids, Aspirin, Fluticasone, Morphine and related, and Sulfa antibiotics   Social History   Tobacco Use  . Smoking status: Former Smoker    Packs/day: 2.00    Years: 22.00    Pack years: 44.00    Types: Cigarettes    Quit date: 01/09/1981    Years since quitting: 37.8  . Smokeless tobacco: Never Used  Substance Use Topics  . Alcohol use: No  . Drug use: No     Family Hx: The patient's family history includes Arthritis in his daughter; Cervical cancer in his daughter; Lung cancer in his father; Prostate cancer in his father and paternal uncle; Psoriasis in his daughter; Stomach cancer in  his maternal grandmother.  ROS:   Please see the history of present illness.     All other systems reviewed and are negative.   Prior CV studies:   The following studies were reviewed today:  08/2015 echo Study Conclusions  - Left ventricle: The cavity size was normal. Wall thickness was   normal. Systolic function was normal. The estimated ejection   fraction was in the range of 55% to 60%. Wall motion was normal;   there were no regional wall motion abnormalities. Left   ventricular diastolic function parameters were normal for the   patient&'s age. - Aortic valve: Mildly calcified annulus. Trileaflet.  There was   trivial regurgitation. - Mitral valve: Calcified annulus. There was mild regurgitation. - Right ventricle: The cavity size was mildly dilated. - Right atrium: The atrium was mildly dilated. Central venous   pressure (est): 3 mm Hg. - Tricuspid valve: There was mild regurgitation. - Pulmonary arteries: PA peak pressure: 42 mm Hg (S). - Pericardium, extracardiac: There was no pericardial effusion.  Impressions:  - Normal LV wall thickness with LVEF 55-60%. Grossly normal   diastolic function. Mild MAC with mild mitral regurgitation.   Mildly sclerotic aortic annulus with trivial aortic   regurgitation. Mild RV enlargement with normal contraction. Mild   tricuspid regurgitation with elevated PASP 42 mmHg.       Labs/Other Tests and Data Reviewed:    EKG:  No ECG reviewed.  Recent Labs: 02/12/2018: ALT 23; BUN 10; Creatinine, Ser 1.13; Potassium 4.2; Sodium 141   Recent Lipid Panel Lab Results  Component Value Date/Time   CHOL 108 02/12/2018 12:41 PM   CHOL 186 05/20/2012 10:28 AM   TRIG 103 02/12/2018 12:41 PM   TRIG 76 06/11/2014 12:41 PM   TRIG 164 (H) 05/20/2012 10:28 AM   HDL 43 02/12/2018 12:41 PM   HDL 39 (L) 06/11/2014 12:41 PM   HDL 42 05/20/2012 10:28 AM   CHOLHDL 2.5 02/12/2018 12:41 PM   LDLCALC 44 02/12/2018 12:41 PM   LDLCALC 89 01/20/2013 04:05 PM   LDLCALC 111 (H) 05/20/2012 10:28 AM    Wt Readings from Last 3 Encounters:  11/05/18 180 lb (81.6 kg)  02/12/18 188 lb (85.3 kg)  11/09/17 188 lb (85.3 kg)     Objective:    Vital Signs:  BP (!) 147/61   Pulse 80   Ht _0  (1.702 m)   Wt 180 lb (81.6 kg)   BMI 28.19 kg/m    Normal affect. Normal speech pattern and tone. Comfortable, no apparent distress.   ASSESSMENT & PLAN:    1. SOB - ongoing symptoms, he did not f/u with the stress test we had ordered at our last visit.  - with recent productive cough and smoking history would start further workup with PFTs. Pending results,  reconsider stress testing at that time  2. HTN - elevated by home bp today, he will call us Friday with a bp log, med changes pending bp trends     COVID-19 Education: The signs and symptoms of COVID-19 were discussed with the patient and how to seek care for testing (follow up with PCP or arrange E-visit).  The importance of social distancing was discussed today.  Time:   Today, I have spent 20 minutes with the patient with telehealth technology discussing the above problems.     Medication Adjustments/Labs and Tests Ordered: Current medicines are reviewed at length with the patient today.  Concerns regarding medicines are outlined above.  Tests Ordered: No orders of the defined types were placed in this encounter.   Medication Changes: No orders of the defined types were placed in this encounter.   Follow Up:  In Person in 3 month(s)  Signed, [name], MD  11/05/2018 10:50 AM    [place]

## 2018-11-05 NOTE — Telephone Encounter (Signed)
°  Precert needed for: PFT   Location: [facility]    Date: Dec 02, 2018

## 2018-11-05 NOTE — Patient Instructions (Signed)
Medication Instructions:  Your physician recommends that you continue on your current medications as directed. Please refer to the Current Medication list given to you today.  *If you need a refill on your cardiac medications before your next appointment, please call your pharmacy*  Lab Work: None today If you have labs (blood work) drawn today and your tests are completely normal, you will receive your results only by: Marland Kitchen MyChart Message (if you have MyChart) OR . A paper copy in the mail If you have any lab test that is abnormal or we need to change your treatment, we will call you to review the results.  Testing/Procedures: Your physician has recommended that you have a pulmonary function test. Pulmonary Function Tests are a group of tests that measure how well air moves in and out of your lungs.    Follow-Up: At [facility], you and your health needs are our priority.  As part of our continuing mission to provide you with exceptional heart care, we have created designated Provider Care Teams.  These Care Teams include your primary Cardiologist (physician) and Advanced Practice Providers (APPs -  Physician Assistants and Nurse Practitioners) who all work together to provide you with the care you need, when you need it.  Your next appointment:   3 months  The format for your next appointment:   In Person  Provider:   [name], MD  Other Instructions None      Thank you for choosing Rodney Hernandez !

## 2018-11-06 ENCOUNTER — Telehealth: Payer: Commercial Managed Care - HMO | Admitting: Cardiology

## 2018-11-15 ENCOUNTER — Telehealth: Payer: Self-pay | Admitting: Cardiology

## 2018-11-15 DIAGNOSIS — I10 Essential (primary) hypertension: Secondary | ICD-10-CM

## 2018-11-15 NOTE — Telephone Encounter (Signed)
Rodney Hernandez called stating that he was to give Dr. Harl Bowie his BP readings. Tried calling patient back but no answer.

## 2018-11-15 NOTE — Telephone Encounter (Signed)
Called pt. No answer. Left message for pt to return call.  

## 2018-11-19 MED ORDER — CHLORTHALIDONE 25 MG PO TABS: 25.0000 mg | ORAL_TABLET | Freq: Every day | ORAL | 3 refills | Status: DC

## 2018-11-19 NOTE — Telephone Encounter (Signed)
NEW MESSAGE    Pt c/o BP issue: STAT if pt c/o blurred vision, one-sided weakness or slurred speech  1. What are your last 5 BP readings? 155/68 - 141/68   2. Are you having any other symptoms (ex. Dizziness, headache, blurred vision, passed out)? NO   3. What is your BP issue?  HE IS JUST REPORTING HIS BP, HE IS NOT HAVING ANY PROBLEMS , WAS TOLD TO KEEP DR BRANCH UPDATED

## 2018-11-19 NOTE — Telephone Encounter (Signed)
Will forward to Dr. Branch as an FYI: 

## 2018-11-19 NOTE — Telephone Encounter (Signed)
BP too high, can he stop HCTZ and start chlorthalidone 25mg  daily. Needs BMET/Mg in 2 weeks. Update [place] on bp's around that time as well   Zandra Abts MD

## 2018-11-19 NOTE — Telephone Encounter (Signed)
Pt made aware, voiced understanding of plan. Will have labs done in 2 weeks. Sent in RX to [facility].

## 2018-11-21 ENCOUNTER — Telehealth: Payer: Self-pay

## 2018-11-21 NOTE — Telephone Encounter (Signed)
New message     Patient wants to know when he is suppose to be taking the :  olmesartan (BENICAR) 40 MG tablet TAKE 1 TABLET BY MOUTH EVERY DAY   chlorthalidone (HYGROTON) 25 MG tablet Take 1 tablet (25 mg total) by mouth daily.   Should he be taking this in the morning or at night

## 2018-11-21 NOTE — Telephone Encounter (Signed)
New message    Returning your call for his medication

## 2018-11-21 NOTE — Telephone Encounter (Signed)
Patient should take chlorthalidone (diuretic) in the am.He can take benicar either am or pm, [id number]

## 2018-11-21 NOTE — Telephone Encounter (Signed)
I spoke with patient.

## 2018-11-26 ENCOUNTER — Encounter (HOSPITAL_COMMUNITY): Payer: Self-pay

## 2018-11-29 ENCOUNTER — Other Ambulatory Visit: Payer: Self-pay

## 2018-11-29 DIAGNOSIS — Z20822 Contact with and (suspected) exposure to covid-19: Secondary | ICD-10-CM

## 2018-12-02 ENCOUNTER — Other Ambulatory Visit: Payer: Self-pay

## 2018-12-02 ENCOUNTER — Observation Stay (HOSPITAL_COMMUNITY)
Admission: EM | Admit: 2018-12-02 | Discharge: 2018-12-03 | Disposition: A | Discharge status: Home / Self Care | Payer: Medicare HMO | Attending: Internal Medicine | Admitting: Internal Medicine

## 2018-12-02 ENCOUNTER — Telehealth: Payer: Self-pay

## 2018-12-02 ENCOUNTER — Encounter (HOSPITAL_COMMUNITY): Payer: Self-pay | Admitting: Emergency Medicine

## 2018-12-02 ENCOUNTER — Emergency Department (HOSPITAL_COMMUNITY): Payer: Medicare HMO

## 2018-12-02 ENCOUNTER — Ambulatory Visit (HOSPITAL_COMMUNITY)
Admission: RE | Admit: 2018-12-02 | Discharge: 2018-12-02 | Disposition: A | Discharge status: Home / Self Care | Payer: Medicare HMO | Source: Ambulatory Visit | Attending: Cardiology | Admitting: Cardiology

## 2018-12-02 DIAGNOSIS — K219 Gastro-esophageal reflux disease without esophagitis: Secondary | ICD-10-CM | POA: Diagnosis not present

## 2018-12-02 DIAGNOSIS — N400 Enlarged prostate without lower urinary tract symptoms: Secondary | ICD-10-CM | POA: Insufficient documentation

## 2018-12-02 DIAGNOSIS — N179 Acute kidney failure, unspecified: Secondary | ICD-10-CM | POA: Diagnosis present

## 2018-12-02 DIAGNOSIS — Z885 Allergy status to narcotic agent status: Secondary | ICD-10-CM | POA: Diagnosis not present

## 2018-12-02 DIAGNOSIS — E1169 Type 2 diabetes mellitus with other specified complication: Secondary | ICD-10-CM

## 2018-12-02 DIAGNOSIS — M1990 Unspecified osteoarthritis, unspecified site: Secondary | ICD-10-CM | POA: Insufficient documentation

## 2018-12-02 DIAGNOSIS — L4050 Arthropathic psoriasis, unspecified: Secondary | ICD-10-CM | POA: Diagnosis not present

## 2018-12-02 DIAGNOSIS — R0609 Other forms of dyspnea: Secondary | ICD-10-CM | POA: Insufficient documentation

## 2018-12-02 DIAGNOSIS — R739 Hyperglycemia, unspecified: Secondary | ICD-10-CM | POA: Insufficient documentation

## 2018-12-02 DIAGNOSIS — E785 Hyperlipidemia, unspecified: Secondary | ICD-10-CM | POA: Insufficient documentation

## 2018-12-02 DIAGNOSIS — Z79899 Other long term (current) drug therapy: Secondary | ICD-10-CM | POA: Diagnosis not present

## 2018-12-02 DIAGNOSIS — I10 Essential (primary) hypertension: Secondary | ICD-10-CM | POA: Diagnosis present

## 2018-12-02 DIAGNOSIS — R0600 Dyspnea, unspecified: Secondary | ICD-10-CM

## 2018-12-02 DIAGNOSIS — Z886 Allergy status to analgesic agent status: Secondary | ICD-10-CM | POA: Diagnosis not present

## 2018-12-02 DIAGNOSIS — Z888 Allergy status to other drugs, medicaments and biological substances status: Secondary | ICD-10-CM | POA: Insufficient documentation

## 2018-12-02 DIAGNOSIS — Z87891 Personal history of nicotine dependence: Secondary | ICD-10-CM | POA: Insufficient documentation

## 2018-12-02 DIAGNOSIS — R55 Syncope and collapse: Principal | ICD-10-CM | POA: Insufficient documentation

## 2018-12-02 DIAGNOSIS — R0789 Other chest pain: Secondary | ICD-10-CM | POA: Diagnosis not present

## 2018-12-02 DIAGNOSIS — Z20828 Contact with and (suspected) exposure to other viral communicable diseases: Secondary | ICD-10-CM | POA: Insufficient documentation

## 2018-12-02 DIAGNOSIS — Z8546 Personal history of malignant neoplasm of prostate: Secondary | ICD-10-CM | POA: Insufficient documentation

## 2018-12-02 DIAGNOSIS — Z881 Allergy status to other antibiotic agents status: Secondary | ICD-10-CM | POA: Diagnosis not present

## 2018-12-02 DIAGNOSIS — R0602 Shortness of breath: Secondary | ICD-10-CM

## 2018-12-02 DIAGNOSIS — J449 Chronic obstructive pulmonary disease, unspecified: Secondary | ICD-10-CM | POA: Insufficient documentation

## 2018-12-02 DIAGNOSIS — Z882 Allergy status to sulfonamides status: Secondary | ICD-10-CM | POA: Diagnosis not present

## 2018-12-02 HISTORY — DX: Other forms of dyspnea: R06.09

## 2018-12-02 HISTORY — DX: Dyspnea, unspecified: R06.00

## 2018-12-02 LAB — HEPATIC FUNCTION PANEL
ALT: 27 U/L (ref 0–44)
AST: 22 U/L (ref 15–41)
Albumin: 4 g/dL (ref 3.5–5.0)
Alkaline Phosphatase: 83 U/L (ref 38–126)
Bilirubin, Direct: <0.1 mg/dL (ref 0.0–0.2)
Total Bilirubin: 0.6 mg/dL (ref 0.3–1.2)
Total Protein: 6.7 g/dL (ref 6.5–8.1)

## 2018-12-02 LAB — BASIC METABOLIC PANEL
Anion gap: 9 (ref 5–15)
BUN: 25 mg/dL — ABNORMAL HIGH (ref 8–23)
CO2: 28 mmol/L (ref 22–32)
Calcium: 10.1 mg/dL (ref 8.9–10.3)
Chloride: 99 mmol/L (ref 98–111)
Creatinine, Ser: 1.49 mg/dL — ABNORMAL HIGH (ref 0.61–1.24)
GFR calc Af Amer: 52 mL/min — ABNORMAL LOW (ref >60)
GFR calc non Af Amer: 45 mL/min — ABNORMAL LOW (ref >60)
Glucose, Bld: 143 mg/dL — ABNORMAL HIGH (ref 70–99)
Potassium: 3.5 mmol/L (ref 3.5–5.1)
Sodium: 136 mmol/L (ref 135–145)

## 2018-12-02 LAB — URINALYSIS, ROUTINE W REFLEX MICROSCOPIC
Bilirubin Urine: NEGATIVE
Glucose, UA: NEGATIVE mg/dL
Hgb urine dipstick: NEGATIVE
Ketones, ur: NEGATIVE mg/dL
Leukocytes,Ua: NEGATIVE
Nitrite: NEGATIVE
Protein, ur: NEGATIVE mg/dL
Specific Gravity, Urine: 1.011 (ref 1.005–1.030)
pH: 8 (ref 5.0–8.0)

## 2018-12-02 LAB — PULMONARY FUNCTION TEST
FEF 25-75 Pre: 0.55 L/sec
FEF2575-%Pred-Pre: 27 %
FEV1-%Pred-Pre: 62 %
FEV1-Pre: 1.69 L
FEV1FVC-%Pred-Pre: 80 %
FEV6-%Pred-Pre: 73 %
FEV6-Pre: 2.56 L
FEV6FVC-%Pred-Pre: 94 %
FVC-%Pred-Pre: 77 %
FVC-Pre: 2.89 L
Pre FEV1/FVC ratio: 58 %
Pre FEV6/FVC Ratio: 89 %

## 2018-12-02 LAB — CBC
HCT: 42.8 % (ref 39.0–52.0)
Hemoglobin: 14.2 g/dL (ref 13.0–17.0)
MCH: 28.7 pg (ref 26.0–34.0)
MCHC: 33.2 g/dL (ref 30.0–36.0)
MCV: 86.6 fL (ref 80.0–100.0)
Platelets: 333 10*3/uL (ref 150–400)
RBC: 4.94 MIL/uL (ref 4.22–5.81)
RDW: 14.7 % (ref 11.5–15.5)
WBC: 9.8 10*3/uL (ref 4.0–10.5)
nRBC: 0 % (ref 0.0–0.2)

## 2018-12-02 LAB — NOVEL CORONAVIRUS, NAA: SARS-CoV-2, NAA: NOT DETECTED

## 2018-12-02 LAB — SARS CORONAVIRUS 2 (TAT 6-24 HRS): SARS Coronavirus 2: NEGATIVE

## 2018-12-02 LAB — D-DIMER, QUANTITATIVE: D-Dimer, Quant: <0.27 ug/mL-FEU (ref 0.00–0.50)

## 2018-12-02 MED ORDER — SODIUM CHLORIDE 0.9% FLUSH
3.0000 mL | Freq: Two times a day (BID) | INTRAVENOUS | Status: DC
  Administered 2018-12-03: 3 mL via INTRAVENOUS

## 2018-12-02 MED ORDER — ESCITALOPRAM OXALATE 20 MG PO TABS
20.0000 mg | ORAL_TABLET | Freq: Every day | ORAL | Status: DC
  Administered 2018-12-03: 20 mg via ORAL
  Filled 2018-12-02: qty 1

## 2018-12-02 MED ORDER — ACETAMINOPHEN 325 MG PO TABS: 650.0000 mg | ORAL_TABLET | Freq: Four times a day (QID) | ORAL | Status: DC | PRN

## 2018-12-02 MED ORDER — FOLIC ACID 1 MG PO TABS
1.0000 mg | ORAL_TABLET | Freq: Every day | ORAL | Status: DC
  Administered 2018-12-02 – 2018-12-03 (×2): 1 mg via ORAL
  Filled 2018-12-02 (×2): qty 1

## 2018-12-02 MED ORDER — ALBUTEROL SULFATE (2.5 MG/3ML) 0.083% IN NEBU: 2.5000 mg | INHALATION_SOLUTION | Freq: Four times a day (QID) | RESPIRATORY_TRACT | Status: DC | PRN

## 2018-12-02 MED ORDER — ONDANSETRON HCL 4 MG PO TABS: 4.0000 mg | ORAL_TABLET | Freq: Four times a day (QID) | ORAL | Status: DC | PRN

## 2018-12-02 MED ORDER — PANTOPRAZOLE SODIUM 40 MG PO TBEC
40.0000 mg | DELAYED_RELEASE_TABLET | Freq: Two times a day (BID) | ORAL | Status: DC
  Administered 2018-12-02 – 2018-12-03 (×2): 40 mg via ORAL
  Filled 2018-12-02 (×2): qty 1

## 2018-12-02 MED ORDER — SODIUM CHLORIDE 0.9 % IV BOLUS
1000.0000 mL | Freq: Once | INTRAVENOUS | Status: AC
  Administered 2018-12-02: 1000 mL via INTRAVENOUS

## 2018-12-02 MED ORDER — ACETAMINOPHEN 650 MG RE SUPP: 650.0000 mg | Freq: Four times a day (QID) | RECTAL | Status: DC | PRN

## 2018-12-02 MED ORDER — ALBUTEROL SULFATE (2.5 MG/3ML) 0.083% IN NEBU: 2.5000 mg | INHALATION_SOLUTION | Freq: Once | RESPIRATORY_TRACT | Status: DC

## 2018-12-02 MED ORDER — SODIUM CHLORIDE 0.9 % IV SOLN
INTRAVENOUS | Status: DC
  Administered 2018-12-02 – 2018-12-03 (×2): via INTRAVENOUS

## 2018-12-02 MED ORDER — SUCRALFATE 1 G PO TABS: 1.0000 g | ORAL_TABLET | Freq: Every day | ORAL | Status: DC | PRN

## 2018-12-02 MED ORDER — PANTOPRAZOLE SODIUM 40 MG PO TBEC: 40.0000 mg | DELAYED_RELEASE_TABLET | Freq: Two times a day (BID) | ORAL | Status: DC

## 2018-12-02 MED ORDER — ONDANSETRON HCL 4 MG/2ML IJ SOLN: 4.0000 mg | Freq: Four times a day (QID) | INTRAMUSCULAR | Status: DC | PRN

## 2018-12-02 MED ORDER — ATORVASTATIN CALCIUM 40 MG PO TABS
40.0000 mg | ORAL_TABLET | Freq: Every day | ORAL | Status: DC
  Administered 2018-12-02: 40 mg via ORAL
  Filled 2018-12-02: qty 1

## 2018-12-02 MED ORDER — ENOXAPARIN SODIUM 40 MG/0.4ML ~~LOC~~ SOLN
40.0000 mg | SUBCUTANEOUS | Status: DC
  Administered 2018-12-02: 40 mg via SUBCUTANEOUS
  Filled 2018-12-02: qty 0.4

## 2018-12-02 NOTE — Telephone Encounter (Signed)
Respiratory therapy from [facility] called to say while patient was forcefully blowing out air during is PFT, he states his vision dimmed,he felt dizzy and light headed.RT states his O2 sats were great, but he could not blow out long enough to have a valid test.They stopped test and are sending patient home.Symptoms had resolved, patient states he felt tired.

## 2018-12-02 NOTE — ED Provider Notes (Signed)
[facility] EMERGENCY DEPARTMENT Provider Note   CSN: [id number] Arrival date & time: 12/02/18  1029     History   Chief Complaint Chief Complaint  Patient presents with  . Loss of Consciousness    HPI Rodney Hernandez is a 73 y.o. male with a pertinent PMH of GERD, HTN, Asthma, BPH/Hx of prostate cancer, psoriasis (MTX) who presents to [facility] ED with subjective loss of consciousness during pulmonary function testing. Nurse tech states that his eyes were open but didn't respond for 1-3 seconds. After this episode he felt tired, and shaky. Patient admits to one prior episode of presyncope several years ago while playing golf that he believes is 2/2 to heat exhaustion. He does admit to shortness of breath for several years that seems to be worse with activity. He dose have an albuterol inhalers for Asthma, and usually uses it about once a month. Does have a history of allergic rhinitis. His SHOB is worse laying down, which he believes is due to his GERD. During these times he has associated dyspepsia with belching. He denies swelling in his leg or chest pain. Recently started taking Chlorthalidone in the last couple of weeks, but denies decreased PO intake. Patient does not currently smoke but has a 20 pack/year hx with early morning cough. He denies a personal or fmaily history of seizures. Last Echo was in 2017, which showed an EF of 55-60% without aortic stenosis. His cardiologist ordered a stress test, but he did not do it because he was worried about it worsening his asthma.       HPI  Past Medical History:  Diagnosis Date  . Arthritis    psoriatic arthritis and osteo arthritis  . Asthma   . Collapse of lung   . Colon polyps   . Eczema   . GERD (gastroesophageal reflux disease)   . Hypertension   . Seasonal allergies   . Vertigo     Patient Active Problem List   Diagnosis Date Noted  . Syncope 12/02/2018  . Prostate cancer ([place]) 02/12/2018  . Peripheral edema  08/24/2016  . Aortic atherosclerosis ([place]) 07/20/2015  . Hyperlipidemia with target LDL less than 100 05/13/2015  . Psoriasis 05/13/2015  . Hypogonadism in male 06/11/2014  . GERD (gastroesophageal reflux disease) 05/20/2012  . Hypertension 05/20/2012  . Eczema 05/20/2012  . Asthma, chronic 05/20/2012  . BPH (benign prostatic hyperplasia) 05/20/2012  . Psoriatic arthritis ([place]) 05/20/2012  . Helicobacter positive gastritis 05/20/2012    Past Surgical History:  Procedure Laterality Date  . collapsed lung    . HERNIA REPAIR    . PROSTATE SURGERY          Home Medications    Prior to Admission medications   Medication Sig Start Date End Date Taking? Authorizing Provider  albuterol (VENTOLIN HFA) 108 (90 Base) MCG/ACT inhaler TAKE 2 PUFFS BY MOUTH EVERY 6 HOURS AS NEEDED FOR WHEEZE OR SHORTNESS OF BREATH Patient taking differently: Inhale 2 puffs into the lungs every 6 (six) hours as needed for wheezing or shortness of breath.  03/27/18  Yes Supinski, Mary-Margaret, FNP  atorvastatin (LIPITOR) 40 MG tablet TAKE 1 TABLET BY MOUTH EVERY DAY IN THE EVENING Patient taking differently: Take 40 mg by mouth daily.  10/14/18  Yes Fleeger, Mary-Margaret, FNP  chlorthalidone (HYGROTON) 25 MG tablet Take 1 tablet (25 mg total) by mouth daily. 11/19/18 02/17/19 Yes BranchAlphonse Guild, MD  escitalopram (LEXAPRO) 20 MG tablet Take 1 tablet (20 mg total) by  mouth daily. 05/13/18  Yes Vessels, Mary-Margaret, FNP  folic acid (FOLVITE) 1 MG tablet Take 1 mg by mouth daily.  11/29/12  Yes [provider]  methotrexate 25 MG/ML injection Inject 50 mg/m2 as directed once a week.  11/06/12  Yes [provider]  olmesartan (BENICAR) 40 MG tablet TAKE 1 TABLET BY MOUTH EVERY DAY Patient taking differently: Take 40 mg by mouth daily.  09/30/18  Yes Charbonnet, Mary-Margaret, FNP  pantoprazole (PROTONIX) 40 MG tablet Take 1 tablet (40 mg total) by mouth 2 (two) times daily. 05/13/18 12/02/18 Yes Mckenny,  Mary-Margaret, FNP  sucralfate (CARAFATE) 1 g tablet TAKE 1 TABLET BY MOUTH FOUR TIMES A DAY WITH MEALS AND AT BEDTIME. Patient taking differently: Take 1 g by mouth daily as needed.  07/29/18  Yes Oberman, Mary-Margaret, FNP  Testosterone (ANDROGEL) 40.5 MG/2.5GM (1.62%) GEL Apply 1 application topically daily. 10/11/18  Yes Hassell Done, Mary-Margaret, FNP  traMADol (ULTRAM) 50 MG tablet Take 50-150 mg by mouth 2 (two) times daily as needed (pain).    Yes [provider]    Family History Family History  Problem Relation Age of Onset  . Lung cancer Father   . Prostate cancer Father   . Stomach cancer Maternal Grandmother   . Prostate cancer Paternal Uncle   . Cervical cancer Daughter   . Psoriasis Daughter   . Arthritis Daughter        psoriatic arthritis    Social History Social History   Tobacco Use  . Smoking status: Former Smoker    Packs/day: 2.00    Years: 22.00    Pack years: 44.00    Types: Cigarettes    Quit date: 01/09/1981    Years since quitting: 37.9  . Smokeless tobacco: Never Used  Substance Use Topics  . Alcohol use: No  . Drug use: No     Allergies   Nsaids, Aspirin, Fluticasone, Morphine and related, and Sulfa antibiotics   Review of Systems Review of Systems   Physical Exam Updated Vital Signs BP 128/66 (BP Location: Left Arm)   Pulse 61   Temp 98.3 F (36.8 C) (Oral)   Resp 13   SpO2 100%   Physical Exam   ED Treatments / Results  Labs (all labs ordered are listed, but only abnormal results are displayed) Labs Reviewed  BASIC METABOLIC PANEL - Abnormal; Notable for the following components:      Result Value   Glucose, Bld 143 (*)    BUN 25 (*)    Creatinine, Ser 1.49 (*)    GFR calc non Af Amer 45 (*)    GFR calc Af Amer 52 (*)    All other components within normal limits  SARS CORONAVIRUS 2 (TAT 6-24 HRS)  CBC  URINALYSIS, ROUTINE W REFLEX MICROSCOPIC  HEPATIC FUNCTION PANEL  D-DIMER, QUANTITATIVE (NOT AT [facility])    EKG  "normal EKG, normal sinus rhythm","unchanged from previous tracings"  Radiology Dg Chest 2 View  Result Date: 12/02/2018 CLINICAL DATA:  Shortness of breath EXAM: CHEST - 2 VIEW COMPARISON:  July 20, 2015 FINDINGS: There is no appreciable edema or consolidation. There is slight left base atelectasis. Heart size and pulmonary vascularity are normal. There is aortic atherosclerosis. There is degenerative change in the thoracic spine. IMPRESSION: Slight left base atelectasis. No edema or consolidation. Cardiac silhouette within normal limits. Aortic Atherosclerosis (ICD10-I70.0). Electronically Signed   By: Lowella Grip III M.D.   On: 12/02/2018 14:30    Procedures Procedures (including critical  care time)  Medications Ordered in ED Medications  atorvastatin (LIPITOR) tablet 40 mg (has no administration in time range)  folic acid (FOLVITE) tablet 1 mg (has no administration in time range)  sucralfate (CARAFATE) tablet 1 g (has no administration in time range)  pantoprazole (PROTONIX) EC tablet 40 mg (has no administration in time range)  escitalopram (LEXAPRO) tablet 20 mg (has no administration in time range)  enoxaparin (LOVENOX) injection 40 mg (has no administration in time range)  sodium chloride flush (NS) 0.9 % injection 3 mL (has no administration in time range)  0.9 %  sodium chloride infusion (has no administration in time range)  acetaminophen (TYLENOL) tablet 650 mg (has no administration in time range)    Or  acetaminophen (TYLENOL) suppository 650 mg (has no administration in time range)  ondansetron (ZOFRAN) tablet 4 mg (has no administration in time range)    Or  ondansetron (ZOFRAN) injection 4 mg (has no administration in time range)  albuterol (PROVENTIL) (2.5 MG/3ML) 0.083% nebulizer solution 2.5 mg (has no administration in time range)  sodium chloride 0.9 % bolus 1,000 mL (1,000 mLs Intravenous New Bag/Given 12/02/18 1250)     Initial Impression / Assessment  and Plan / ED Course  I have reviewed the triage vital signs and the nursing notes.  Pertinent labs & imaging results that were available during my care of the patient were reviewed by me and considered in my medical decision making (see chart for details).  [name] is a 73 y.o. who presents with sign and symptoms concerning for syncope and progressive shortness of breath possibly 2/2 to cardiac etiology.  - No leukocytosis - Elevated creatine at 1.49 (1000 cc bolus of NS given) - D-dimer negative  - UA negative  - CXR shows left base atelectasis   Final Clinical Impressions(s) / ED Diagnoses   Final diagnoses:  None    ED Discharge Orders    None       Marianna Payment, MD 12/02/18 [place], Wenda [place]land, MD 12/08/18 1952

## 2018-12-02 NOTE — ED Notes (Signed)
Nuclear Med contact: pt not to have Caffeine and pt to be NPO after Midnight (12/02/2018)

## 2018-12-02 NOTE — ED Notes (Signed)
Dinner Tray Ordered @ 1830.  

## 2018-12-02 NOTE — Telephone Encounter (Signed)
Looks like he is in the hospital being seen by the inpatient cardiology team, we will reassess everything at f/u pending discharge   J Raziyah Vanvleck MD

## 2018-12-02 NOTE — Progress Notes (Addendum)
PFT study Pt in to day for PFT study.  Only abl to obtain 3 FVC attempts.  While doing first FVC manuever, pt became light headed and felt like he passed out for a second.  Called his name several time before he answered. He recovered and we attempted to finish the testing.  Patient c/o of not feeling well and extremely exhausted.  Dr. Harl Bowie office was call and pt was taken to ED for evaluation.  Pt HR after first FVC 113  oxygen saturations 96% on RA.

## 2018-12-02 NOTE — ED Triage Notes (Signed)
Pt states he was having pulmonary tests here per cardiololgy and passed out. Pt now has no complaints. Denies any pain.

## 2018-12-02 NOTE — H&P (Signed)
History and Physical    Rodney Hernandez [id number] DOB: [birth year] DOA: 12/02/2018  Referring MD/NP/PA: Marianna Payment, MD(resident) PCP: Chevis Pretty, FNP  Patient coming from: From PFT study  Chief Complaint: Synope  I have personally briefly reviewed patient's old medical records in [place]   HPI: Rodney Hernandez is a 73 y.o. male with medical history significant of HTN, asthma, psoriatic arthritis, remote tobacco use, vertigo, and severe GERD.  Patient presents after passing out during his pulmonary function testing today.  Patient reports that he was asked to blow out for a prolonged period in time when he felt himself blackout.  He possibly lost consciousness for few seconds before coming to.  He slumped over in his chair, but never fell out or hit his head.  He has had progressively worsening dyspnea on exertion over the last 3 years.  He had initially been evaluated by Dr. Harl Bowie, but patient did not follow-up for all of the testing at that time.  He repeat televisit on 10/27 due to symptoms still not improving.  He complains of having a productive cough and dyspnea on exertion that waxes and wanes in intensity.  On Sundays he is unable to even walk to the bathroom without having to rest to catch his breath.  He reports having pretty severe acid reflux for which he was offered fundoplication in the past by his gastroenterologist in [facility] Dr. Ancil Linsey.  Other associated symptoms include occasional lightheadedness from coughings, dizziness with trouble with balance.  He reports that he was having issues with fluctuations in his blood pressure and had been started on chlorthalidone 25 mg daily on 11/10, but reports that it also help with lower extremity swelling complaints.  ED Course: Upon admission patient was started vital signs within normal limits.  Labs revealed BUN 25, creatinine 1.49, glucose 143, and D-dimer <0.27.  Covid 19 screening negative 3 days ago.  Chest x-ray  showed left basilar atelectasis without signs of edema or consolidation.  Patient was given 1 L normal saline IV fluids.  TRH called to admit due to syncope and acute kidney injury.  Cardiology was consulted and recommended stress test and checking echocardiogram.  TRH called to admit.    Review of Systems  Constitutional: Negative for fever and weight loss.  HENT: Negative for ear discharge and nosebleeds.   Eyes: Negative for double vision and photophobia.  Respiratory: Positive for cough, sputum production and shortness of breath.   Cardiovascular: Negative for chest pain and leg swelling.  Gastrointestinal: Positive for heartburn. Negative for abdominal pain, diarrhea, nausea and vomiting.  Genitourinary: Negative for dysuria and hematuria.  Musculoskeletal: Negative for falls and myalgias.  Skin: Negative for itching.  Neurological: Positive for dizziness and loss of consciousness.  Psychiatric/Behavioral: Negative for memory loss and substance abuse.    Past Medical History:  Diagnosis Date   Arthritis    psoriatic arthritis and osteo arthritis   Asthma    Collapse of lung    Colon polyps    Eczema    GERD (gastroesophageal reflux disease)    Hypertension    Seasonal allergies    Vertigo     Past Surgical History:  Procedure Laterality Date   collapsed lung     HERNIA REPAIR     PROSTATE SURGERY       reports that he quit smoking about 37 years ago. His smoking use included cigarettes. He has a 44.00 pack-year smoking history. He has never used smokeless tobacco. He reports  that he does not drink alcohol or use drugs.  Allergies  Allergen Reactions   Nsaids Shortness Of Breath   Aspirin Other (See Comments)    Affects patient breathing   Fluticasone Rash    Of face   Morphine And Related Hives and Itching   Sulfa Antibiotics     Family History  Problem Relation Age of Onset   Lung cancer Father    Prostate cancer Father    Stomach cancer  Maternal Grandmother    Prostate cancer Paternal Uncle    Cervical cancer Daughter    Psoriasis Daughter    Arthritis Daughter        psoriatic arthritis    Prior to Admission medications   Medication Sig Start Date End Date Taking? Authorizing Provider  albuterol (VENTOLIN HFA) 108 (90 Base) MCG/ACT inhaler TAKE 2 PUFFS BY MOUTH EVERY 6 HOURS AS NEEDED FOR WHEEZE OR SHORTNESS OF BREATH Patient taking differently: Inhale 2 puffs into the lungs every 6 (six) hours as needed for wheezing or shortness of breath.  03/27/18  Yes Carton, Mary-Margaret, FNP  atorvastatin (LIPITOR) 40 MG tablet TAKE 1 TABLET BY MOUTH EVERY DAY IN THE EVENING Patient taking differently: Take 40 mg by mouth daily.  10/14/18  Yes Wauneka, Mary-Margaret, FNP  chlorthalidone (HYGROTON) 25 MG tablet Take 1 tablet (25 mg total) by mouth daily. 11/19/18 02/17/19 Yes Branch, Alphonse Guild, MD  escitalopram (LEXAPRO) 20 MG tablet Take 1 tablet (20 mg total) by mouth daily. 05/13/18  Yes Grajeda, Mary-Margaret, FNP  folic acid (FOLVITE) 1 MG tablet Take 1 mg by mouth daily.  11/29/12  Yes [provider]  methotrexate 25 MG/ML injection Inject 50 mg/m2 as directed once a week.  11/06/12  Yes [provider]  olmesartan (BENICAR) 40 MG tablet TAKE 1 TABLET BY MOUTH EVERY DAY Patient taking differently: Take 40 mg by mouth daily.  09/30/18  Yes Popowski, Mary-Margaret, FNP  pantoprazole (PROTONIX) 40 MG tablet Take 1 tablet (40 mg total) by mouth 2 (two) times daily. 05/13/18 12/02/18 Yes Eaker, Mary-Margaret, FNP  sucralfate (CARAFATE) 1 g tablet TAKE 1 TABLET BY MOUTH FOUR TIMES A DAY WITH MEALS AND AT BEDTIME. Patient taking differently: Take 1 g by mouth daily as needed.  07/29/18  Yes Stipes, Mary-Margaret, FNP  Testosterone (ANDROGEL) 40.5 MG/2.5GM (1.62%) GEL Apply 1 application topically daily. 10/11/18  Yes Hassell Done, Mary-Margaret, FNP  traMADol (ULTRAM) 50 MG tablet Take 50-150 mg by mouth 2 (two) times daily as  needed (pain).    Yes [provider]    Physical Exam:  Constitutional: Elderly male who appears to be in some distress as he is coughing. Vitals:   12/02/18 1300 12/02/18 1301 12/02/18 1315 12/02/18 1330  BP:  128/66    Pulse: 63 64 63 61  Resp: 11 12 15 13   Temp:      TempSrc:      SpO2: 97% 100% 99% 100%   Eyes: PERRL, lids and conjunctivae normal ENMT: Mucous membranes are moist. Posterior pharynx clear of any exudate or lesions.Normal dentition.  Neck: normal, supple, no masses, no thyromegaly Respiratory: clear to auscultation bilaterally, no wheezing, no crackles. Normal respiratory effort. No accessory muscle use.  Cardiovascular: Regular rate and rhythm, no murmurs / rubs / gallops. No extremity edema. 2+ pedal pulses. No carotid bruits.  Abdomen: no tenderness, no masses palpated. No hepatosplenomegaly. Bowel sounds positive.  Musculoskeletal: no clubbing / cyanosis. No joint deformity upper and lower extremities. Good ROM, no contractures.  Normal muscle tone.  Skin: no rashes, lesions, ulcers. No induration Neurologic: CN 2-12 grossly intact. Sensation intact, DTR normal. Strength 5/5 in all 4.  Psychiatric: Normal judgment and insight. Alert and oriented x 3. Normal mood.     Labs on Admission: I have personally reviewed following labs and imaging studies  CBC: Recent Labs  Lab 12/02/18 1050  WBC 9.8  HGB 14.2  HCT 42.8  MCV 86.6  PLT 0000000   Basic Metabolic Panel: Recent Labs  Lab 12/02/18 1050  NA 136  K 3.5  CL 99  CO2 28  GLUCOSE 143*  BUN 25*  CREATININE 1.49*  CALCIUM 10.1   GFR: CrCl cannot be calculated (Unknown ideal weight.). Liver Function Tests: Recent Labs  Lab 12/02/18 1050  AST 22  ALT 27  ALKPHOS 83  BILITOT 0.6  PROT 6.7  ALBUMIN 4.0   No results for input(s): LIPASE, AMYLASE in the last 168 hours. No results for input(s): AMMONIA in the last 168 hours. Coagulation Profile: No results for input(s): INR, PROTIME  in the last 168 hours. Cardiac Enzymes: No results for input(s): CKTOTAL, CKMB, CKMBINDEX, TROPONINI in the last 168 hours. BNP (last 3 results) No results for input(s): PROBNP in the last 8760 hours. HbA1C: No results for input(s): HGBA1C in the last 72 hours. CBG: No results for input(s): GLUCAP in the last 168 hours. Lipid Profile: No results for input(s): CHOL, HDL, LDLCALC, TRIG, CHOLHDL, LDLDIRECT in the last 72 hours. Thyroid Function Tests: No results for input(s): TSH, T4TOTAL, FREET4, T3FREE, THYROIDAB in the last 72 hours. Anemia Panel: No results for input(s): VITAMINB12, FOLATE, FERRITIN, TIBC, IRON, RETICCTPCT in the last 72 hours. Urine analysis:    Component Value Date/Time   COLORURINE YELLOW 12/02/2018 1215   APPEARANCEUR CLEAR 12/02/2018 1215   LABSPEC 1.011 12/02/2018 1215   PHURINE 8.0 12/02/2018 1215   GLUCOSEU NEGATIVE 12/02/2018 1215   HGBUR NEGATIVE 12/02/2018 1215   BILIRUBINUR NEGATIVE 12/02/2018 1215   KETONESUR NEGATIVE 12/02/2018 1215   PROTEINUR NEGATIVE 12/02/2018 1215   NITRITE NEGATIVE 12/02/2018 1215   LEUKOCYTESUR NEGATIVE 12/02/2018 1215   Sepsis Labs: Recent Results (from the past 240 hour(s))  Novel Coronavirus, NAA ([facility])     Status: None   Collection Time: 11/29/18 12:00 AM   Specimen: Nasopharyngeal(NP) swabs in vial transport medium   NASOPHARYNGE  TESTING  Result Value Ref Range Status   SARS-CoV-2, NAA Not Detected Not Detected Final    Comment: This nucleic acid amplification test was developed and its performance characteristics determined by [employer]. Nucleic acid amplification tests include PCR and TMA. This test has not been FDA cleared or approved. This test has been authorized by FDA under an Emergency Use Authorization (EUA). This test is only authorized for the duration of time the declaration that circumstances exist justifying the authorization of the emergency use of in vitro diagnostic tests for  detection of SARS-CoV-2 virus and/or diagnosis of COVID-19 infection under section 564(b)(1) of the Act, 21 U.S.C. [id number]) (1), unless the authorization is terminated or revoked sooner. When diagnostic testing is negative, the possibility of a false negative result should be considered in the context of a patient's recent exposures and the presence of clinical signs and symptoms consistent with COVID-19. An individual without symptoms of COVID-19 and who is not shedding SARS-CoV-2 virus would  expect to have a negative (not detected) result in this assay.      Radiological Exams on Admission: Dg Chest 2 View  Result  Date: 12/02/2018 CLINICAL DATA:  Shortness of breath EXAM: CHEST - 2 VIEW COMPARISON:  July 20, 2015 FINDINGS: There is no appreciable edema or consolidation. There is slight left base atelectasis. Heart size and pulmonary vascularity are normal. There is aortic atherosclerosis. There is degenerative change in the thoracic spine. IMPRESSION: Slight left base atelectasis. No edema or consolidation. Cardiac silhouette within normal limits. Aortic Atherosclerosis (ICD10-I70.0). Electronically Signed   By: Lowella Grip III M.D.   On: 12/02/2018 14:30    EKG: Independently reviewed.  Sinus rhythm at 64 bpm  Assessment/Plan Syncope: Acute.  Syncopal episode occurred while patient was blowing out for PFT study.    Preliminary read from PFTs noted moderate obstructive pulmonary disease. Suspect of vasovagal episode versus hypoxia. -Admit to a medical telemetry -Follow-up telemetry overnight  Dyspnea on exertion: Acute on chronic.  Patient reports a 3-year history of progressively worsening shortness of breath that waxes and wanes in intensity.  Reports remote 20 pack smoking year history of tobacco abuse.  Patient is on testosterone replacement, but D-dimer was negative.  PFTs have been ordered today with preliminary report showing moderate pulmonary obstructive disease as  possible cause of symptoms.  Cardiology was consulted and plan on obtaining echocardiogram and stress test.  If cardiac causes ruled out would also consider patient's reflux as a possible cause of symptoms -Follow-up echocardiogram and stress testing -Appreciate cardiology consultative services, we will follow for further recommendations  Acute kidney injury: Baseline creatinine previous 1.13 on 02/2018. Patient presents with creatinine 1.49 with BUN 25.  He was given 1 L normal saline IV fluids. Recently started on chlorthalidone in the last week and a half.  Suspect overdiuresis as likely cause of symptoms. -Check urine sodium, urine creatinine, urine urea -Hold nephrotoxic agents -Gentle IV fluids normal saline at 75 mL/h -Recheck kidney function in a.m.  Essential hypertension: Blood pressures currently stable. -Hold chlorthalidone and olmesartan due to AKI.  Hyperglycemia: Acute.  Glucose mildly elevated at 143 on admission. -Check hemoglobin A1c  Gerd: Patient reports severe history of acid reflux.  Previously, advised that he was a candidate for banding for treatment.  Question if patient's acid reflux worsening his respiratory status. -Elevate head of bed -Continue Protonix and Carafate prn   DVT prophylaxis: Lovenox Code Status: Full Family Communication: Discussed plan of care with the patient and his daughter present at bedside Disposition Plan: TBD  Consults called: Cardiology  Admission status: observation  Norval Morton MD [facility] Pager [phone]   If 7PM-7AM, please contact night-coverage [email or web address] Password [facility]  12/02/2018, 2:37 PM

## 2018-12-02 NOTE — Consult Note (Signed)
Cardiology Consultation:   Patient ID: Chike Farrington; [id number]; Feb 24, 1945   Admit date: 12/02/2018 Date of Consult: 12/02/2018  Primary Care Provider: Chevis Pretty, FNP Primary Cardiologist: [name], MD 11/05/2018 Televisit Primary Electrophysiologist:  None   Patient Profile:   Machael Raine is a 73 y.o. male with a hx of DOE (recently worse w/ no cause found), HTN, HLD, FH CAD, remote tobacco use, GERD, OA and psoriatic arthritis, asthma, nl Echo 2017, who is being seen today for the evaluation of syncope and SOB at the request of Dr Rex Kras.  History of Present Illness:   Mr. Buffalo had a virtual visit with Dr Harl Bowie 10/27. He had been evaluated in 2017 for DOE and with mult CRFs>>MV ordered, never done. At televisit 10/27, DOE seemed to be getting worse, +productive cough and + remote tobacco>>PFTs ordered. BP was also elevated, pt to keep home BP log and call it in, may need med adjustment.  Today, pt here for PFTs and had brief syncope, cards asked to see. No notes or info are available from the tech who performed the PFTs. It happened when he was blowing out for a prolonged period of time. It was very brief, he did not fall, merely slumped over. He did lose vision and hearing for a period of time. He thinks it was only for a few seconds.    Mr Mcbain has a hx of occasional dizziness, does not feel it when he first gets out of bed. He will get it later, when he has been working out in the yard on hot days. He drinks plenty of water, 7-8 1/2 liter bottles of water a day. Symptoms resolve when he sits down.  He has balance problems, will get that when he stands up or walks a long way. He has not fallen, but stumbles at times or bumps into things. Most of the time, what is attributed to dizziness is actually a balance issue. He feels that if sx worsen, he will fall, not pass out.   Occasionally will get light-headed when he coughs a lot. That causes near-syncope at times,  but will improve if he can catch his breath. He gets coughing spells when his reflux acts up, that is the main thing that makes him cough.  Rarely has asthma/wheezing.   He was having problems with LE edema and swelling in his hands. This was originally controlled by HCTZ, but it was changed to chlorthalidone 25 mg qd on 11/10. The change was for better BP control, but it helped the edema as well.   Past Medical History:  Diagnosis Date  . Arthritis    psoriatic arthritis and osteo arthritis  . Asthma   . Collapse of lung   . Colon polyps   . DOE (dyspnea on exertion) 12/02/2018  . Eczema   . GERD (gastroesophageal reflux disease)   . Hypertension   . Seasonal allergies   . Vertigo     Past Surgical History:  Procedure Laterality Date  . collapsed lung    . HERNIA REPAIR    . PROSTATE SURGERY       Prior to Admission medications   Medication Sig Start Date End Date Taking? Authorizing Provider  albuterol (VENTOLIN HFA) 108 (90 Base) MCG/ACT inhaler TAKE 2 PUFFS BY MOUTH EVERY 6 HOURS AS NEEDED FOR WHEEZE OR SHORTNESS OF BREATH Patient taking differently: Inhale 2 puffs into the lungs every 6 (six) hours as needed for wheezing or shortness of breath.  03/27/18  Yes  Hassell Done, Mary-Margaret, FNP  atorvastatin (LIPITOR) 40 MG tablet TAKE 1 TABLET BY MOUTH EVERY DAY IN THE EVENING Patient taking differently: Take 40 mg by mouth daily.  10/14/18  Yes Urton, Mary-Margaret, FNP  chlorthalidone (HYGROTON) 25 MG tablet Take 1 tablet (25 mg total) by mouth daily. 11/19/18 02/17/19 Yes Branch, Alphonse Guild, MD  escitalopram (LEXAPRO) 20 MG tablet Take 1 tablet (20 mg total) by mouth daily. 05/13/18  Yes Saab, Mary-Margaret, FNP  folic acid (FOLVITE) 1 MG tablet Take 1 mg by mouth daily.  11/29/12  Yes [provider]  methotrexate 25 MG/ML injection Inject 50 mg/m2 as directed once a week.  11/06/12  Yes [provider]  olmesartan (BENICAR) 40 MG tablet TAKE 1 TABLET BY MOUTH  EVERY DAY Patient taking differently: Take 40 mg by mouth daily.  09/30/18  Yes Johnston, Mary-Margaret, FNP  pantoprazole (PROTONIX) 40 MG tablet Take 1 tablet (40 mg total) by mouth 2 (two) times daily. 05/13/18 12/02/18 Yes Iwata, Mary-Margaret, FNP  sucralfate (CARAFATE) 1 g tablet TAKE 1 TABLET BY MOUTH FOUR TIMES A DAY WITH MEALS AND AT BEDTIME. Patient taking differently: Take 1 g by mouth daily as needed.  07/29/18  Yes Whiten, Mary-Margaret, FNP  Testosterone (ANDROGEL) 40.5 MG/2.5GM (1.62%) GEL Apply 1 application topically daily. 10/11/18  Yes Hassell Done, Mary-Margaret, FNP  traMADol (ULTRAM) 50 MG tablet Take 50-150 mg by mouth 2 (two) times daily as needed (pain).    Yes [provider]    Inpatient Medications: Scheduled Meds:  Continuous Infusions:  PRN Meds:   Allergies:    Allergies  Allergen Reactions  . Nsaids Shortness Of Breath  . Aspirin Other (See Comments)    Affects patient breathing  . Fluticasone Rash    Of face  . Morphine And Related Hives and Itching  . Sulfa Antibiotics     Social History:   Social History   Socioeconomic History  . Marital status: Widowed    Spouse name: Not on file  . Number of children: 2  . Years of education: 21  . Highest education level: High school graduate  Occupational History  . Occupation: Retired    Comment: worked in a Writer for 33 years  Social Needs  . Financial resource strain: Not hard at all  . Food insecurity    Worry: Never true    Inability: Never true  . Transportation needs    Medical: No    Non-medical: No  Tobacco Use  . Smoking status: Former Smoker    Packs/day: 2.00    Years: 22.00    Pack years: 44.00    Types: Cigarettes    Quit date: 01/09/1981    Years since quitting: 37.9  . Smokeless tobacco: Never Used  Substance and Sexual Activity  . Alcohol use: No  . Drug use: No  . Sexual activity: Not Currently  Lifestyle  . Physical activity    Days per week: 0 days     Minutes per session: 0 min  . Stress: Not at all  Relationships  . Social connections    Talks on phone: More than three times a week    Gets together: More than three times a week    Attends religious service: More than 4 times per year    Active member of club or organization: No    Attends meetings of clubs or organizations: Never    Relationship status: Widowed  . Intimate partner violence    Fear of current  or ex partner: No    Emotionally abused: No    Physically abused: No    Forced sexual activity: No  Other Topics Concern  . Not on file  Social History Narrative   Widowed, 2 children   Right handed   12th grade   3 cups daily    Family History:   Family History  Problem Relation Age of Onset  . Lung cancer Father   . Prostate cancer Father   . Stomach cancer Maternal Grandmother   . Prostate cancer Paternal Uncle   . Cervical cancer Daughter   . Psoriasis Daughter   . Arthritis Daughter        psoriatic arthritis   Family Status:  Family Status  Relation Name Status  . Father  Deceased at age 55  . MGM  Deceased  . Annamarie Major  (Not Specified)  . Mother  Deceased at age 31s  . Sister  Alive  . Brother  Alive  . Daughter  Alive  . Daughter  Alive  . Sister  Alive    ROS:  Please see the history of present illness.  All other ROS reviewed and negative.     Physical Exam/Data:   Orthostatic VS for the past 24 hrs (Last 3 readings):  BP- Lying Pulse- Lying BP- Sitting Pulse- Sitting BP- Standing at 0 minutes Pulse- Standing at 0 minutes  12/02/18 1301 134/71 63 119/71 67 136/72 68    Vitals:   12/02/18 1300 12/02/18 1301 12/02/18 1315 12/02/18 1330  BP:  128/66    Pulse: 63 64 63 61  Resp: _0 Temp:      TempSrc:      SpO2: 97% 100% 99% 100%   No intake or output data in the 24 hours ending 12/02/18 1547 There were no vitals filed for this visit. There is no height or weight on file to calculate BMI.  General:  Well nourished, well  developed, elderly male in no acute distress HEENT: normal Lymph: no adenopathy Neck: JVD - not elevated Endocrine:  No thryomegaly Vascular: No carotid bruits; 4/4 extremity pulses 2+  Cardiac:  normal S1, S2; RRR; no murmur Lungs:  clear bilaterally, no wheezing, rhonchi or rales  Abd: soft, nontender, no hepatomegaly  Ext: no edema Musculoskeletal:  Chronic joint deformities MIP joints, BUE and BLE strength normal and equal Skin: warm and dry  Neuro:  CNs 2-12 intact, no focal abnormalities noted Psych:  Normal affect   EKG:  The EKG was personally reviewed and demonstrates:  11/23 ECG is SR, HR 64, no acute ischemic changes, Q waves III only Telemetry:  Telemetry was personally reviewed and demonstrates:  SR, no sig ectopy or bradycardia   CV studies:   ECHO: 08/10/2015 - Left ventricle: The cavity size was normal. Wall thickness was   normal. Systolic function was normal. The estimated ejection   fraction was in the range of 55% to 60%. Wall motion was normal;   there were no regional wall motion abnormalities. Left   ventricular diastolic function parameters were normal for the   patient&'s age. - Aortic valve: Mildly calcified annulus. Trileaflet. There was   trivial regurgitation. - Mitral valve: Calcified annulus. There was mild regurgitation. - Right ventricle: The cavity size was mildly dilated. - Right atrium: The atrium was mildly dilated. Central venous   pressure (est): 3 mm Hg. - Tricuspid valve: There was mild regurgitation. - Pulmonary arteries: PA peak pressure: 42 mm Hg (  S). - Pericardium, extracardiac: There was no pericardial effusion.  Impressions:  - Normal LV wall thickness with LVEF 55-60%. Grossly normal   diastolic function. Mild MAC with mild mitral regurgitation.   Mildly sclerotic aortic annulus with trivial aortic   regurgitation. Mild RV enlargement with normal contraction. Mild   tricuspid regurgitation with elevated PASP 42 mmHg.    Laboratory Data:   Chemistry Recent Labs  Lab 12/02/18 1050  NA 136  K 3.5  CL 99  CO2 28  GLUCOSE 143*  BUN 25*  CREATININE 1.49*  CALCIUM 10.1  GFRNONAA 45*  GFRAA 52*  ANIONGAP 9    Lab Results  Component Value Date   ALT 27 12/02/2018   AST 22 12/02/2018   ALKPHOS 83 12/02/2018   BILITOT 0.6 12/02/2018   Hematology Recent Labs  Lab 12/02/18 1050  WBC 9.8  RBC 4.94  HGB 14.2  HCT 42.8  MCV 86.6  MCH 28.7  MCHC 33.2  RDW 14.7  PLT 333   Cardiac Enzymes High Sensitivity Troponin:  No results for input(s): TROPONINIHS in the last 720 hours.    BNPNo results for input(s): BNP, PROBNP in the last 168 hours.  DDimer  Recent Labs  Lab 12/02/18 1050  DDIMER <0.27   TSH: No results found for: TSH Lipids: Lab Results  Component Value Date   CHOL 108 02/12/2018   HDL 43 02/12/2018   LDLCALC 44 02/12/2018   TRIG 103 02/12/2018   CHOLHDL 2.5 02/12/2018   HgbA1c:No results found for: HGBA1C Magnesium: No results found for: MG   Radiology/Studies:  Dg Chest 2 View  Result Date: 12/02/2018 CLINICAL DATA:  Shortness of breath EXAM: CHEST - 2 VIEW COMPARISON:  July 20, 2015 FINDINGS: There is no appreciable edema or consolidation. There is slight left base atelectasis. Heart size and pulmonary vascularity are normal. There is aortic atherosclerosis. There is degenerative change in the thoracic spine. IMPRESSION: Slight left base atelectasis. No edema or consolidation. Cardiac silhouette within normal limits. Aortic Atherosclerosis (ICD10-I70.0). Electronically Signed   By: Lowella Grip III M.D.   On: 12/02/2018 14:30    Assessment and Plan:   1. 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 DOE - has been going on at least 3 years - Dr Harl Bowie wanted MV then, pt did not get. -  spoke w/ pt and wife about Lexiscan, I am on call tomorrow and can do. - they agree to do, will order - may be primary pulm problem plus deconditioning, but pt has mult CRFs, so need to eval for CAD - no S&S heart failure, f/u on echo  Otherwise, per IM Principal Problem:   Syncope Active Problems:   Hypertension   DOE (dyspnea on exertion)     For questions or updates, please contact [facility] Please consult [email or web address] for contact info under Cardiology/STEMI.   Jonetta Speak, PA-C  12/02/2018 3:47 PM

## 2018-12-02 NOTE — ED Notes (Signed)
ED TO INPATIENT HANDOFF REPORT  ED Nurse Name and Phone #: Lunette Stands Tripp Name/Age/Gender Rodney Hernandez 73 y.o. male Room/Bed: [id number]  Code Status   Code Status: Full Code  Home/SNF/Other Home Patient oriented to: self, place, time and situation Is this baseline? Yes   Triage Complete: Triage complete  Chief Complaint Cardiac   Triage Note Pt states he was having pulmonary tests here per cardiololgy and passed out. Pt now has no complaints. Denies any pain.    Allergies Allergies  Allergen Reactions  . Nsaids Shortness Of Breath  . Aspirin Other (See Comments)    Affects patient breathing  . Fluticasone Rash    Of face  . Morphine And Related Hives and Itching  . Sulfa Antibiotics     Level of Care/Admitting Diagnosis ED Disposition    ED Disposition Condition [facility]: [place] [[id number]]  Level of Care: Telemetry Medical [104]  I expect the patient will be discharged within 24 hours: Yes  LOW acuity---Tx typically complete <24 hrs---ACUTE conditions typically can be evaluated <24 hours---LABS likely to return to acceptable levels <24 hours---IS near functional baseline---EXPECTED to return to current living arrangement---NOT newly hypoxic: Meets criteria for 5C-Observation unit  Covid Evaluation: Asymptomatic Screening Protocol (No Symptoms)  Diagnosis: Syncope [id number]  Admitting Physician: Norval Morton [id number]  Attending Physician: Norval Morton [id number]  PT Class (Do Not Modify): Observation [104]  PT Acc Code (Do Not Modify): Observation [10022]       B Medical/Surgery History Past Medical History:  Diagnosis Date  . Arthritis    psoriatic arthritis and osteo arthritis  . Asthma   . Collapse of lung   . Colon polyps   . DOE (dyspnea on exertion) 12/02/2018  . Eczema   . GERD (gastroesophageal reflux disease)   . Hypertension   . Seasonal allergies   . Vertigo    Past Surgical  History:  Procedure Laterality Date  . collapsed lung    . HERNIA REPAIR    . PROSTATE SURGERY       A IV Location/Drains/Wounds Patient Lines/Drains/Airways Status   Active Line/Drains/Airways    Name:   Placement date:   Placement time:   Site:   Days:   Peripheral IV 12/02/18 Right;Medial Forearm   12/02/18    1500    Forearm   less than 1   Peripheral IV (Ped) 12/02/18 Forearm   12/02/18    1250     less than 1          Intake/Output Last 24 hours  Intake/Output Summary (Last 24 hours) at 12/02/2018 2047 Last data filed at 12/02/2018 1547 Gross per 24 hour  Intake 950 ml  Output -  Net 950 ml    Labs/Imaging Results for orders placed or performed during the hospital encounter of 12/02/18 (from the past 48 hour(s))  Basic metabolic panel     Status: Abnormal   Collection Time: 12/02/18 10:50 AM  Result Value Ref Range   Sodium 136 135 - 145 mmol/L   Potassium 3.5 3.5 - 5.1 mmol/L   Chloride 99 98 - 111 mmol/L   CO2 28 22 - 32 mmol/L   Glucose, Bld 143 (H) 70 - 99 mg/dL   BUN 25 (H) 8 - 23 mg/dL   Creatinine, Ser 1.49 (H) 0.61 - 1.24 mg/dL   Calcium 10.1 8.9 - 10.3 mg/dL   GFR calc non Af Amer 45 (L) >60 mL/min  GFR calc Af Amer 52 (L) >60 mL/min   Anion gap 9 5 - 15    Comment: Performed at [facility] [address], [place] [address]  CBC     Status: None   Collection Time: 12/02/18 10:50 AM  Result Value Ref Range   WBC 9.8 4.0 - 10.5 K/uL   RBC 4.94 4.22 - 5.81 MIL/uL   Hemoglobin 14.2 13.0 - 17.0 g/dL   HCT 42.8 39.0 - 52.0 %   MCV 86.6 80.0 - 100.0 fL   MCH 28.7 26.0 - 34.0 pg   MCHC 33.2 30.0 - 36.0 g/dL   RDW 14.7 11.5 - 15.5 %   Platelets 333 150 - 400 K/uL   nRBC 0.0 0.0 - 0.2 %    Comment: Performed at [facility], [place] [address] [place], [place] [address]  Hepatic function panel     Status: None   Collection Time: 12/02/18 10:50 AM  Result Value Ref Range   Total Protein 6.7 6.5 - 8.1 g/dL   Albumin 4.0 3.5 - 5.0  g/dL   AST 22 15 - 41 U/L   ALT 27 0 - 44 U/L   Alkaline Phosphatase 83 38 - 126 U/L   Total Bilirubin 0.6 0.3 - 1.2 mg/dL   Bilirubin, Direct <0.1 0.0 - 0.2 mg/dL   Indirect Bilirubin NOT CALCULATED 0.3 - 0.9 mg/dL    Comment: Performed at [facility] [address], [place], [place] [address]  D-dimer, quantitative (not at [facility])     Status: None   Collection Time: 12/02/18 10:50 AM  Result Value Ref Range   D-Dimer, Quant <0.27 0.00 - 0.50 ug/mL-FEU    Comment: (NOTE) At the manufacturer cut-off of 0.50 ug/mL FEU, this assay has been documented to exclude PE with a sensitivity and negative predictive value of 97 to 99%.  At this time, this assay has not been approved by the FDA to exclude DVT/VTE. Results should be correlated with clinical presentation. Performed at [facility], [place] [address] [place], [place] [address]   Urinalysis, Routine w reflex microscopic     Status: None   Collection Time: 12/02/18 12:15 PM  Result Value Ref Range   Color, Urine YELLOW YELLOW   APPearance CLEAR CLEAR   Specific Gravity, Urine 1.011 1.005 - 1.030   pH 8.0 5.0 - 8.0   Glucose, UA NEGATIVE NEGATIVE mg/dL   Hgb urine dipstick NEGATIVE NEGATIVE   Bilirubin Urine NEGATIVE NEGATIVE   Ketones, ur NEGATIVE NEGATIVE mg/dL   Protein, ur NEGATIVE NEGATIVE mg/dL   Nitrite NEGATIVE NEGATIVE   Leukocytes,Ua NEGATIVE NEGATIVE    Comment: Performed at [facility] [address], [place], [place] [address]  SARS CORONAVIRUS 2 (TAT 6-24 HRS) Nasopharyngeal Nasopharyngeal Swab     Status: None   Collection Time: 12/02/18  3:46 PM   Specimen: Nasopharyngeal Swab  Result Value Ref Range   SARS Coronavirus 2 NEGATIVE NEGATIVE    Comment: (NOTE) SARS-CoV-2 target nucleic acids are NOT DETECTED. The SARS-CoV-2 RNA is generally detectable in upper and lower respiratory specimens during the acute phase of infection. Negative results do not preclude SARS-CoV-2 infection, do not rule  out co-infections with other pathogens, and should not be used as the sole basis for treatment or other patient management decisions. Negative results must be combined with clinical observations, patient history, and epidemiological information. The expected result is Negative. Fact Sheet for Patients: SugarRoll.be  Fact Sheet for Healthcare Providers: https://www.woods-mathews.com/ This test is not yet approved or cleared by the [place] FDA and  has been authorized for detection and/or diagnosis of SARS-CoV-2 by FDA under an Emergency Use Authorization (EUA). This EUA will remain  in effect (meaning this test can be used) for the duration of the COVID-19 declaration under Section 56 4(b)(1) of the Act, 21 U.S.C. section 360bbb-3(b)(1), unless the authorization is terminated or revoked sooner. Performed at [facility], [place] [address] [place], [place] [address]    Dg Chest 2 View  Result Date: 12/02/2018 CLINICAL DATA:  Shortness of breath EXAM: CHEST - 2 VIEW COMPARISON:  July 20, 2015 FINDINGS: There is no appreciable edema or consolidation. There is slight left base atelectasis. Heart size and pulmonary vascularity are normal. There is aortic atherosclerosis. There is degenerative change in the thoracic spine. IMPRESSION: Slight left base atelectasis. No edema or consolidation. Cardiac silhouette within normal limits. Aortic Atherosclerosis (ICD10-I70.0). Electronically Signed   By: Lowella Grip III M.D.   On: 12/02/2018 14:30    Pending Labs Unresulted Labs (From admission, onward)    Start     Ordered   12/03/18 0500  CBC  Tomorrow morning,   R     12/02/18 1507   12/03/18 [id number]  Basic metabolic panel  Tomorrow morning,   R     12/02/18 1507   12/03/18 0500  Hemoglobin A1c  Tomorrow morning,   R     12/02/18 1826          Vitals/Pain Today's Vitals   12/02/18 1839 12/02/18 1845 12/02/18 1900 12/02/18 2000  BP:  (!) 143/73 131/70 133/85 (!) 123/52  Pulse: 68 69 73 81  Resp: 13 12 13 18   Temp:      TempSrc:      SpO2: 94% 95% 94% (!) 88%  PainSc:        Isolation Precautions No active isolations  Medications Medications  atorvastatin (LIPITOR) tablet 40 mg (40 mg Oral Given [id number])  folic acid (FOLVITE) tablet 1 mg (1 mg Oral Given 12/02/18 1643)  sucralfate (CARAFATE) tablet 1 g (has no administration in time range)  escitalopram (LEXAPRO) tablet 20 mg (has no administration in time range)  enoxaparin (LOVENOX) injection 40 mg (40 mg Subcutaneous Given 12/02/18 1852)  sodium chloride flush (NS) 0.9 % injection 3 mL (0 mLs Intravenous Hold 12/02/18 1555)  0.9 %  sodium chloride infusion ( Intravenous New Bag/Given 12/02/18 1608)  acetaminophen (TYLENOL) tablet 650 mg (has no administration in time range)    Or  acetaminophen (TYLENOL) suppository 650 mg (has no administration in time range)  ondansetron (ZOFRAN) tablet 4 mg (has no administration in time range)    Or  ondansetron (ZOFRAN) injection 4 mg (has no administration in time range)  albuterol (PROVENTIL) (2.5 MG/3ML) 0.083% nebulizer solution 2.5 mg (has no administration in time range)  pantoprazole (PROTONIX) EC tablet 40 mg (40 mg Oral Given 12/02/18 1643)  sodium chloride 0.9 % bolus 1,000 mL (0 mLs Intravenous Stopped 12/02/18 1547)    Mobility walks Low fall risk   Focused Assessments Cardiac Assessment Handoff:    No results found for: CKTOTAL, CKMB, CKMBINDEX, TROPONINI Lab Results  Component Value Date   DDIMER <0.27 12/02/2018   Does the Patient currently have chest pain? No     R Recommendations: See Admitting Provider Note  Report given to:   Additional Notes: Pt admitted to have Stress Test tomorrow, NPO after  midnight tonight and not to have any caffeine from now until then

## 2018-12-03 ENCOUNTER — Observation Stay (HOSPITAL_BASED_OUTPATIENT_CLINIC_OR_DEPARTMENT_OTHER): Payer: Medicare HMO

## 2018-12-03 ENCOUNTER — Encounter (HOSPITAL_COMMUNITY): Payer: Self-pay | Admitting: *Deleted

## 2018-12-03 ENCOUNTER — Telehealth: Payer: Self-pay | Admitting: Cardiology

## 2018-12-03 DIAGNOSIS — N179 Acute kidney failure, unspecified: Secondary | ICD-10-CM | POA: Diagnosis not present

## 2018-12-03 DIAGNOSIS — R0789 Other chest pain: Secondary | ICD-10-CM | POA: Diagnosis not present

## 2018-12-03 DIAGNOSIS — K219 Gastro-esophageal reflux disease without esophagitis: Secondary | ICD-10-CM | POA: Diagnosis not present

## 2018-12-03 DIAGNOSIS — R0600 Dyspnea, unspecified: Secondary | ICD-10-CM | POA: Diagnosis not present

## 2018-12-03 DIAGNOSIS — I10 Essential (primary) hypertension: Secondary | ICD-10-CM | POA: Diagnosis not present

## 2018-12-03 DIAGNOSIS — R55 Syncope and collapse: Secondary | ICD-10-CM

## 2018-12-03 LAB — CBC
HCT: 37.5 % — ABNORMAL LOW (ref 39.0–52.0)
Hemoglobin: 12.2 g/dL — ABNORMAL LOW (ref 13.0–17.0)
MCH: 28.9 pg (ref 26.0–34.0)
MCHC: 32.5 g/dL (ref 30.0–36.0)
MCV: 88.9 fL (ref 80.0–100.0)
Platelets: 273 10*3/uL (ref 150–400)
RBC: 4.22 MIL/uL (ref 4.22–5.81)
RDW: 14.8 % (ref 11.5–15.5)
WBC: 7.2 10*3/uL (ref 4.0–10.5)
nRBC: 0 % (ref 0.0–0.2)

## 2018-12-03 LAB — ECHOCARDIOGRAM COMPLETE
Height: 67 in
Weight: 2903.02 oz

## 2018-12-03 LAB — HEMOGLOBIN A1C
Hgb A1c MFr Bld: 7.6 % — ABNORMAL HIGH (ref 4.8–5.6)
Mean Plasma Glucose: 171.42 mg/dL

## 2018-12-03 LAB — BASIC METABOLIC PANEL
Anion gap: 7 (ref 5–15)
BUN: 22 mg/dL (ref 8–23)
CO2: 27 mmol/L (ref 22–32)
Calcium: 9.6 mg/dL (ref 8.9–10.3)
Chloride: 104 mmol/L (ref 98–111)
Creatinine, Ser: 1.42 mg/dL — ABNORMAL HIGH (ref 0.61–1.24)
GFR calc Af Amer: 56 mL/min — ABNORMAL LOW (ref >60)
GFR calc non Af Amer: 48 mL/min — ABNORMAL LOW (ref >60)
Glucose, Bld: 149 mg/dL — ABNORMAL HIGH (ref 70–99)
Potassium: 3.8 mmol/L (ref 3.5–5.1)
Sodium: 138 mmol/L (ref 135–145)

## 2018-12-03 MED ORDER — REGADENOSON 0.4 MG/5ML IV SOLN
INTRAVENOUS | Status: AC
  Filled 2018-12-03: qty 5

## 2018-12-03 MED ORDER — BLOOD GLUCOSE MONITOR KIT: PACK | 0 refills | Status: AC

## 2018-12-03 MED ORDER — OLMESARTAN MEDOXOMIL 40 MG PO TABS: 20.0000 mg | ORAL_TABLET | Freq: Every day | ORAL | 0 refills | Status: DC

## 2018-12-03 MED ORDER — TRAMADOL HCL 50 MG PO TABS
50.0000 mg | ORAL_TABLET | Freq: Two times a day (BID) | ORAL | Status: DC | PRN
  Administered 2018-12-03: 50 mg via ORAL
  Filled 2018-12-03: qty 1

## 2018-12-03 MED ORDER — CHLORTHALIDONE 25 MG PO TABS: 12.5000 mg | ORAL_TABLET | Freq: Every day | ORAL | 3 refills | Status: DC

## 2018-12-03 MED ORDER — SUCRALFATE 1 G PO TABS: 1.0000 g | ORAL_TABLET | Freq: Every day | ORAL | Status: DC | PRN

## 2018-12-03 MED ORDER — TECHNETIUM TC 99M TETROFOSMIN IV KIT
10.0000 | PACK | Freq: Once | INTRAVENOUS | Status: AC | PRN
  Administered 2018-12-03: 10 via INTRAVENOUS

## 2018-12-03 MED ORDER — REGADENOSON 0.4 MG/5ML IV SOLN
0.4000 mg | Freq: Once | INTRAVENOUS | Status: DC
  Filled 2018-12-03: qty 5

## 2018-12-03 MED ORDER — TECHNETIUM TC 99M TETROFOSMIN IV KIT
30.0000 | PACK | Freq: Once | INTRAVENOUS | Status: AC | PRN
  Administered 2018-12-03: 30 via INTRAVENOUS

## 2018-12-03 NOTE — TOC Initial Note (Signed)
Transition of Care [facility]) - Initial/Assessment Note    Patient Details  Name: Rodney Hernandez MRN: [id number] Date of Birth: 1945-07-25  Transition of Care [place]) CM/SW Contact:    Marilu Favre, RN Phone Number: 12/03/2018, 12:46 PM  Clinical Narrative:                 Confirmed face sheet information with patient and daughter at bedside. Patient active with PCP has transportation to appointments.   Expected Discharge Plan: Home/Self Care Barriers to Discharge: Continued Medical Work up   Patient Goals and CMS Choice Patient states their goals for this hospitalization and ongoing recovery are:: to go home CMS Medicare.gov Compare Post Acute Care list provided to:: Patient Choice offered to / list presented to : NA  Expected Discharge Plan and Services Expected Discharge Plan: Home/Self Care       Living arrangements for the past 2 months: Single Family Home                 DME Arranged: N/A         HH Arranged: NA          Prior Living Arrangements/Services Living arrangements for the past 2 months: Single Family Home Lives with:: Self Patient language and need for interpreter reviewed:: Yes Do you feel safe going back to the place where you live?: Yes      Need for Family Participation in Patient Care: Yes (Comment) Care giver support system in place?: Yes (comment)   Criminal Activity/Legal Involvement Pertinent to Current Situation/Hospitalization: No - Comment as needed  Activities of Daily Living Home Assistive Devices/Equipment: None ADL Screening (condition at time of admission) Patient's cognitive ability adequate to safely complete daily activities?: Yes Is the patient deaf or have difficulty hearing?: Yes Does the patient have difficulty seeing, even when wearing glasses/contacts?: No Does the patient have difficulty concentrating, remembering, or making decisions?: No Patient able to express need for assistance with ADLs?: Yes Does the patient  have difficulty dressing or bathing?: No Independently performs ADLs?: Yes (appropriate for developmental age) Does the patient have difficulty walking or climbing stairs?: No Weakness of Legs: None Weakness of Arms/Hands: None  Permission Sought/Granted   Permission granted to share information with : No              Emotional Assessment Appearance:: Appears stated age Attitude/Demeanor/Rapport: Engaged Affect (typically observed): Accepting Orientation: : Oriented to Self, Oriented to Place, Oriented to  Time, Oriented to Situation   Psych Involvement: No (comment)  Admission diagnosis:  AKI (acute kidney injury) ([place]) [N17.9] Syncope, unspecified syncope type [R55] Patient Active Problem List   Diagnosis Date Noted  . Syncope 12/02/2018  . DOE (dyspnea on exertion) 12/02/2018  . AKI (acute kidney injury) ([place]) 12/02/2018  . Hyperglycemia 12/02/2018  . Prostate cancer ([place]) 02/12/2018  . Peripheral edema 08/24/2016  . Aortic atherosclerosis ([place]) 07/20/2015  . Hyperlipidemia with target LDL less than 100 05/13/2015  . Psoriasis 05/13/2015  . Hypogonadism in male 06/11/2014  . GERD (gastroesophageal reflux disease) 05/20/2012  . Essential hypertension 05/20/2012  . Eczema 05/20/2012  . Asthma, chronic 05/20/2012  . BPH (benign prostatic hyperplasia) 05/20/2012  . Psoriatic arthritis ([place]) 05/20/2012  . Helicobacter positive gastritis 05/20/2012   PCP:  Chevis Pretty, FNP Pharmacy:   [id number] - [place], [place] [address] Phone: [phone] Fax: [phone]     Social Determinants of Health (SDOH) Interventions  Readmission Risk Interventions No flowsheet data found.

## 2018-12-03 NOTE — Care Management Obs Status (Signed)
[place] NOTIFICATION   Patient Details  Name: Rodney Hernandez MRN: [id number] Date of Birth: January 20, 1945   Medicare Observation Status Notification Given:  Yes    Marilu Favre, RN 12/03/2018, 12:45 PM

## 2018-12-03 NOTE — Progress Notes (Signed)
Stress portion of Lexiscan myoview completed without complications.

## 2018-12-03 NOTE — Progress Notes (Signed)
[name] to be D/C'd Home per MD order.  Discussed with the patient and all questions fully answered.   VSS, Skin clean, dry and intact without evidence of skin break down, no evidence of skin tears noted. IV catheter discontinued intact. Site without signs and symptoms of complications. Dressing and pressure applied.   An After Visit Summary was printed and given to the patient.    D/C education completed with patient/family including follow up instructions, medication list, d/c activities limitations if indicated, with other d/c instructions as indicated by MD - patient able to verbalize understanding, all questions fully answered.    Patient instructed to return to ED, call 911, or call MD for any changes in condition.    Patient escorted via [place], and D/C home via private car by daughter.

## 2018-12-03 NOTE — Telephone Encounter (Signed)
Patient has an appointment with Bernerd Pho on 12/17/18.

## 2018-12-03 NOTE — Progress Notes (Signed)
  Echocardiogram 2D Echocardiogram has been performed.  Kalim Kissel A Shaday Rayborn 12/03/2018, 11:14 AM

## 2018-12-03 NOTE — Discharge Summary (Signed)
Physician Discharge Summary  Rodney Hernandez [id number] DOB: 1945-12-16 DOA: 12/02/2018  PCP: Chevis Pretty, FNP  Admit date: 12/02/2018 Discharge date: 12/03/2018  Admitted From: home Discharge disposition: home   Recommendations for Outpatient Follow-Up:   1. Dietary changes for GERD and elevated HgbA1c 2. Needs close follow up and may need medications started 3. BMP 1 week 4. Consideration of high resolution CT scan of chest for DOE pending PFTs 5. Have cut BP medications in half-- close monitoring of BP   Discharge Diagnosis:   Principal Problem:   Syncope Active Problems:   GERD (gastroesophageal reflux disease)   Essential hypertension   DOE (dyspnea on exertion)   AKI (acute kidney injury) ([place])   Hyperglycemia    Discharge Condition: Improved.  Diet recommendation: Low sodium, heart healthy.  Carbohydrate-modified  Wound care: None.  Code status: Full.   History of Present Illness:   Rodney Hernandez is a 73 y.o. male with medical history significant of HTN, asthma, psoriatic arthritis, remote tobacco use, vertigo, and severe GERD.  Patient presents after passing out during his pulmonary function testing today.  Patient reports that he was asked to blow out for a prolonged period in time when he felt himself blackout.  He possibly lost consciousness for few seconds before coming to.  He slumped over in his chair, but never fell out or hit his head.  He has had progressively worsening dyspnea on exertion over the last 3 years.  He had initially been evaluated by Dr. Harl Bowie, but patient did not follow-up for all of the testing at that time.  He repeat televisit on 10/27 due to symptoms still not improving.  He complains of having a productive cough and dyspnea on exertion that waxes and wanes in intensity.  On Sundays he is unable to even walk to the bathroom without having to rest to catch his breath.  He reports having pretty severe acid reflux for  which he was offered fundoplication in the past by his gastroenterologist in [facility] Dr. Ancil Linsey.  Other associated symptoms include occasional lightheadedness from coughings, dizziness with trouble with balance.  He reports that he was having issues with fluctuations in his blood pressure and had been started on chlorthalidone 25 mg daily on 11/10, but reports that it also help with lower extremity swelling complaints.   Hospital Course by Problem:   Syncope:  -suspect of vasovagal episode   Dyspnea on exertion: Acute on chronic.  Patient reports a 3-year history of progressively worsening shortness of breath that waxes and wanes in intensity.  Reports remote 20 pack smoking year history of tobacco abuse.  Patient is on testosterone replacement, but D-dimer was negative.  PFTs pending.  Cardiology was consulted and plan on obtaining echocardiogram and stress test.    echocardiogram : Left ventricular ejection fraction, by visual estimation, is 60 to 65%. The left ventricle has normal function. There is no left ventricular hypertrophy.  stress testing: low risk -? GERD causing-- patient to make lifestyle changes-- consider high res CT if not improved  Acute kidney injury: Baseline creatinine previous 1.13 on 02/2018. -close outpatient follow up  Essential hypertension:  -1/2 current BP medications  Hyperglycemia: Acute.  Glucose mildly elevated at 143 on admission. -HgbA1c: 7.6 -glucometer -dietary changes and may need medications pending blood sugar log  Gerd: Patient reports severe history of acid reflux.  Previously, advised that he was a candidate for banding for treatment.  Suspect the patient's acid reflux worsening his  respiratory status. -Elevate head of bed -Continue Protonix and Carafate prn      Medical Consultants:   cardiology   Discharge Exam:   Vitals:   12/03/18 1211 12/03/18 1351  BP: 103/69 (!) 124/54  Pulse: 64   Resp: 18   Temp: 98.2 F (36.8  C)   SpO2: 98%    Vitals:   12/03/18 0939 12/03/18 0940 12/03/18 1211 12/03/18 1351  BP: 138/78  103/69 (!) 124/54  Pulse: 75 77 64   Resp:   18   Temp:   98.2 F (36.8 C)   TempSrc:   Oral   SpO2:   98%   Weight:      Height:        General exam: Appears calm and comfortable.    The results of significant diagnostics from this hospitalization (including imaging, microbiology, ancillary and laboratory) are listed below for reference.     Procedures and Diagnostic Studies:   Dg Chest 2 View  Result Date: 12/02/2018 CLINICAL DATA:  Shortness of breath EXAM: CHEST - 2 VIEW COMPARISON:  July 20, 2015 FINDINGS: There is no appreciable edema or consolidation. There is slight left base atelectasis. Heart size and pulmonary vascularity are normal. There is aortic atherosclerosis. There is degenerative change in the thoracic spine. IMPRESSION: Slight left base atelectasis. No edema or consolidation. Cardiac silhouette within normal limits. Aortic Atherosclerosis (ICD10-I70.0). Electronically Signed   By: Lowella Grip III M.D.   On: 12/02/2018 14:30   Nm Myocar Multi W/spect W/wall Motion / Ef  Result Date: 12/03/2018  There was no ST segment deviation noted during stress.  The left ventricular ejection fraction is normal (55-65%).  Nuclear stress EF: 60%.  Defect 1: There is a medium fixed defect of moderate severity present in the basal inferoseptal, basal inferior, mid inferoseptal, mid inferior and apical inferior location. Given fixed defect with normal wall motion in this area, consistent with artifact  No T wave inversion was noted during stress.  The study is normal.  This is a low risk study.      Labs:   Basic Metabolic Panel: Recent Labs  Lab 12/02/18 1050 12/03/18 0408  NA 136 138  K 3.5 3.8  CL 99 104  CO2 28 27  GLUCOSE 143* 149*  BUN 25* 22  CREATININE 1.49* 1.42*  CALCIUM 10.1 9.6   GFR Estimated Creatinine Clearance: 46.2 mL/min (A) (by C-G  formula based on SCr of 1.42 mg/dL (H)). Liver Function Tests: Recent Labs  Lab 12/02/18 1050  AST 22  ALT 27  ALKPHOS 83  BILITOT 0.6  PROT 6.7  ALBUMIN 4.0   No results for input(s): LIPASE, AMYLASE in the last 168 hours. No results for input(s): AMMONIA in the last 168 hours. Coagulation profile No results for input(s): INR, PROTIME in the last 168 hours.  CBC: Recent Labs  Lab 12/02/18 1050 12/03/18 0408  WBC 9.8 7.2  HGB 14.2 12.2*  HCT 42.8 37.5*  MCV 86.6 88.9  PLT 333 273   Cardiac Enzymes: No results for input(s): CKTOTAL, CKMB, CKMBINDEX, TROPONINI in the last 168 hours. BNP: Invalid input(s): POCBNP CBG: No results for input(s): GLUCAP in the last 168 hours. D-Dimer Recent Labs    12/02/18 1050  DDIMER <0.27   Hgb A1c Recent Labs    12/03/18 0408  HGBA1C 7.6*   Lipid Profile No results for input(s): CHOL, HDL, LDLCALC, TRIG, CHOLHDL, LDLDIRECT in the last 72 hours. Thyroid function studies No results for input(s):  TSH, T4TOTAL, T3FREE, THYROIDAB in the last 72 hours.  Invalid input(s): FREET3 Anemia work up No results for input(s): VITAMINB12, FOLATE, FERRITIN, TIBC, IRON, RETICCTPCT in the last 72 hours. Microbiology Recent Results (from the past 240 hour(s))  Novel Coronavirus, NAA ([facility])     Status: None   Collection Time: 11/29/18 12:00 AM   Specimen: Nasopharyngeal(NP) swabs in vial transport medium   NASOPHARYNGE  TESTING  Result Value Ref Range Status   SARS-CoV-2, NAA Not Detected Not Detected Final    Comment: This nucleic acid amplification test was developed and its performance characteristics determined by [employer]. Nucleic acid amplification tests include PCR and TMA. This test has not been FDA cleared or approved. This test has been authorized by FDA under an Emergency Use Authorization (EUA). This test is only authorized for the duration of time the declaration that circumstances exist justifying the  authorization of the emergency use of in vitro diagnostic tests for detection of SARS-CoV-2 virus and/or diagnosis of COVID-19 infection under section 564(b)(1) of the Act, 21 U.S.C. 734KAJ-6(O) (1), unless the authorization is terminated or revoked sooner. When diagnostic testing is negative, the possibility of a false negative result should be considered in the context of a patient's recent exposures and the presence of clinical signs and symptoms consistent with COVID-19. An individual without symptoms of COVID-19 and who is not shedding SARS-CoV-2 virus would  expect to have a negative (not detected) result in this assay.   SARS CORONAVIRUS 2 (TAT 6-24 HRS) Nasopharyngeal Nasopharyngeal Swab     Status: None   Collection Time: 12/02/18  3:46 PM   Specimen: Nasopharyngeal Swab  Result Value Ref Range Status   SARS Coronavirus 2 NEGATIVE NEGATIVE Final    Comment: (NOTE) SARS-CoV-2 target nucleic acids are NOT DETECTED. The SARS-CoV-2 RNA is generally detectable in upper and lower respiratory specimens during the acute phase of infection. Negative results do not preclude SARS-CoV-2 infection, do not rule out co-infections with other pathogens, and should not be used as the sole basis for treatment or other patient management decisions. Negative results must be combined with clinical observations, patient history, and epidemiological information. The expected result is Negative. Fact Sheet for Patients: SugarRoll.be Fact Sheet for Healthcare Providers: https://www.woods-mathews.com/ This test is not yet approved or cleared by the [place] FDA and  has been authorized for detection and/or diagnosis of SARS-CoV-2 by FDA under an Emergency Use Authorization (EUA). This EUA will remain  in effect (meaning this test can be used) for the duration of the COVID-19 declaration under Section 56 4(b)(1) of the Act, 21 U.S.C. section  360bbb-3(b)(1), unless the authorization is terminated or revoked sooner. Performed at [facility], [place] [address], [place], [place] [address]      Discharge Instructions:   Discharge Instructions    Diet - low sodium heart healthy   Complete by: As directed    Diet Carb Modified   Complete by: As directed    Discharge instructions   Complete by: As directed    Keep log of blood sugars as well as blood pressure and bring to PCP GERD diet- see attached information I have cut your BP medications in half-- we will need to monitor your BP closely Pending your symptoms after lifestyle modifications, may need high resolution CT Scan of lungs -- will defer to PCP after PFTs back   Increase activity slowly   Complete by: As directed      Allergies as of 12/03/2018  Reactions   Nsaids Shortness Of Breath   Aspirin Other (See Comments)   Affects patient breathing   Fluticasone Rash   Of face   Morphine And Related Hives, Itching   Sulfa Antibiotics       Medication List    TAKE these medications   albuterol 108 (90 Base) MCG/ACT inhaler Commonly known as: Ventolin HFA TAKE 2 PUFFS BY MOUTH EVERY 6 HOURS AS NEEDED FOR WHEEZE OR SHORTNESS OF BREATH What changed:   how much to take  how to take this  when to take this  reasons to take this  additional instructions   atorvastatin 40 MG tablet Commonly known as: LIPITOR TAKE 1 TABLET BY MOUTH EVERY DAY IN THE EVENING What changed:   how much to take  how to take this  when to take this   blood glucose meter kit and supplies Kit Dispense based on patient and insurance preference. Use up to four times daily as directed. (FOR ICD-9 250.00, 250.01).   chlorthalidone 25 MG tablet Commonly known as: HYGROTON Take 0.5 tablets (12.5 mg total) by mouth daily. What changed: how much to take   escitalopram 20 MG tablet Commonly known as: Lexapro Take 1 tablet (20 mg total) by mouth daily.   folic acid 1  MG tablet Commonly known as: FOLVITE Take 1 mg by mouth daily.   methotrexate 25 MG/ML injection Inject 50 mg/m2 as directed once a week.   olmesartan 40 MG tablet Commonly known as: BENICAR Take 0.5 tablets (20 mg total) by mouth daily. What changed: how much to take   pantoprazole 40 MG tablet Commonly known as: PROTONIX Take 1 tablet (40 mg total) by mouth 2 (two) times daily.   sucralfate 1 g tablet Commonly known as: CARAFATE Take 1 tablet (1 g total) by mouth daily as needed. What changed: See the new instructions.   Testosterone 40.5 MG/2.5GM (1.62%) Gel Commonly known as: AndroGel Apply 1 application topically daily.   traMADol 50 MG tablet Commonly known as: ULTRAM Take 50-150 mg by mouth 2 (two) times daily as needed (pain).      Follow-up Information    Erma Heritage, PA-C Follow up on 12/17/2018.   Specialties: Physician Assistant, Cardiology Why: You have been scheduled for a VIRTUAL visit on 12/17/2018 at 2:00pm for your post-hospital follow-up appointment. You will receive a call 2-3 days prior to your visit with additional information.  Contact information: [address] [place] [address] [phone]        Chevis Pretty, FNP Follow up in 1 week(s).   Specialty: Family Medicine Why: BP check Contact information: [place] [address] [phone]            Time coordinating discharge: 25 min  Signed:  Geradine Girt DO  [facility] 12/03/2018, 4:34 PM

## 2018-12-03 NOTE — Progress Notes (Signed)
Lexiscan stress test portion of diagnostics complete w/o incident. Pt transferred to Nuclear Med for further scans. Snack provided.

## 2018-12-03 NOTE — Progress Notes (Signed)
Progress Note  Patient Name: Rodney Hernandez Date of Encounter: 12/03/2018  Primary Cardiologist: [name], MD   Subjective   Denies any chest pain or SOB.  No further dizziness  Inpatient Medications    Scheduled Meds: . atorvastatin  40 mg Oral q1800  . enoxaparin (LOVENOX) injection  40 mg Subcutaneous Q24H  . escitalopram  20 mg Oral Daily  . folic acid  1 mg Oral Daily  . pantoprazole  40 mg Oral BID  . sodium chloride flush  3 mL Intravenous Q12H   Continuous Infusions: . sodium chloride 75 mL/hr at 12/03/18 0442   PRN Meds: acetaminophen **OR** acetaminophen, albuterol, ondansetron **OR** ondansetron (ZOFRAN) IV, sucralfate   Vital Signs    Vitals:   12/02/18 2124 12/03/18 0010 12/03/18 0448 12/03/18 0547  BP: 138/64 (!) 102/54  126/70  Pulse: 71 (!) 57  65  Resp: 15 16  17   Temp: 98 F (36.7 C) 98.1 F (36.7 C)  97.7 F (36.5 C)  TempSrc: Oral Oral  Oral  SpO2: 98% 95%  96%  Weight:   82.3 kg   Height:   5\' 7"  (1.702 m)     Intake/Output Summary (Last 24 hours) at 12/03/2018 0859 Last data filed at 12/03/2018 0300 Gross per 24 hour  Intake 1883.68 ml  Output 1200 ml  Net 683.68 ml   Filed Weights   12/03/18 0448  Weight: 82.3 kg    Telemetry    NSR - Personally Reviewed  ECG    No new EKG to review - Personally Reviewed  Physical Exam   GEN: No acute distress.   Neck: No JVD Cardiac: RRR, no murmurs, rubs, or gallops.  Respiratory: Clear to auscultation bilaterally. GI: Soft, nontender, non-distended  MS: No edema; No deformity. Neuro:  Nonfocal  Psych: Normal affect   Labs    Chemistry Recent Labs  Lab 12/02/18 1050 12/03/18 0408  NA 136 138  K 3.5 3.8  CL 99 104  CO2 28 27  GLUCOSE 143* 149*  BUN 25* 22  CREATININE 1.49* 1.42*  CALCIUM 10.1 9.6  PROT 6.7  --   ALBUMIN 4.0  --   AST 22  --   ALT 27  --   ALKPHOS 83  --   BILITOT 0.6  --   GFRNONAA 45* 48*  GFRAA 52* 56*  ANIONGAP 9 7     Hematology  Recent Labs  Lab 12/02/18 1050 12/03/18 0408  WBC 9.8 7.2  RBC 4.94 4.22  HGB 14.2 12.2*  HCT 42.8 37.5*  MCV 86.6 88.9  MCH 28.7 28.9  MCHC 33.2 32.5  RDW 14.7 14.8  PLT 333 273    Cardiac EnzymesNo results for input(s): TROPONINI in the last 168 hours. No results for input(s): TROPIPOC in the last 168 hours.   BNPNo results for input(s): BNP, PROBNP in the last 168 hours.   DDimer  Recent Labs  Lab 12/02/18 1050  DDIMER <0.27     Radiology    Dg Chest 2 View  Result Date: 12/02/2018 CLINICAL DATA:  Shortness of breath EXAM: CHEST - 2 VIEW COMPARISON:  July 20, 2015 FINDINGS: There is no appreciable edema or consolidation. There is slight left base atelectasis. Heart size and pulmonary vascularity are normal. There is aortic atherosclerosis. There is degenerative change in the thoracic spine. IMPRESSION: Slight left base atelectasis. No edema or consolidation. Cardiac silhouette within normal limits. Aortic Atherosclerosis (ICD10-I70.0). Electronically Signed   By: Lowella Grip III M.D.  On: 12/02/2018 14:30    Cardiac Studies   none  Patient Profile     73 y.o. male with a hx of HTN, HLD, remote tobacco use, GERD and asthma.  Seen recently by Dr. Harl Bowie complaining of SOB associated with productive cough and GERD problems.  Had near syncopal episode during PFTs.  Assessment & Plan    1.  Syncope -likely vagal in etiology - occurred in setting of breathing out hard and prolonged during PFTs -has hx of near syncope with hard coughing in the past -ddimer negative -creatinine mildly elevated ? dehydration -orthostatic upon laying>>sitting but normal sitting >>standing -diuretic and ARB on hold -2D echo pending  2.  SOB -possibly multifactorial from COPD (moderate obstruction on PFTs) and GERD -GERD appears uncontrolled and likely needs GI referral -2D echo pending -NPO for Lexiscan myoview today -Rx of COPD per [facility]  3.  Atypical CP -more right sided  and starts in upper abdomen/epigastrium -likely related to GERD -Lexiscan myo pending  4.  HTN -BP controlled -not on any antihypertensive meds     For questions or updates, please contact [place] Please consult [email or web address] for contact info under Cardiology/STEMI.      Signed, Fransico Him, MD  12/03/2018, 8:59 AM

## 2018-12-04 ENCOUNTER — Telehealth: Payer: Self-pay

## 2018-12-04 NOTE — Telephone Encounter (Signed)
-----   Message from Massie Maroon, [place] sent at 12/03/2018  8:55 AM EST -----  ----- Message ----- From: Herminio Commons, MD Sent: 12/03/2018   8:54 AM EST To: Massie Maroon, CMA  Moderate obstructive lung disease.

## 2018-12-04 NOTE — Telephone Encounter (Signed)
Called pt., no answer. Left message for pt to return call.  

## 2018-12-06 ENCOUNTER — Other Ambulatory Visit (HOSPITAL_COMMUNITY)
Admission: RE | Admit: 2018-12-06 | Discharge: 2018-12-06 | Disposition: A | Discharge status: Home / Self Care | Payer: Medicare HMO | Source: Ambulatory Visit | Attending: Cardiology | Admitting: Cardiology

## 2018-12-09 ENCOUNTER — Telehealth: Payer: Self-pay

## 2018-12-09 NOTE — Telephone Encounter (Signed)
Pt questions his appointment for 12/8 after getting his pft results. He asks if he still needs to keep follow-up with cardiology or see pcp. Please advise.

## 2018-12-10 NOTE — Telephone Encounter (Signed)
Pt stated he will follow up with his pcp and will keep January appt. Here.

## 2018-12-10 NOTE — Telephone Encounter (Signed)
    It appears this follow-up appointment is from a hospitalization as he had syncope while doing his PFT's. He also underwent stress testing during admission which was low-risk.  If still having symptoms, would recommend keeping follow-up.  If overall improved, he can follow-up with his PCP and keep routine follow-up with cardiology.  Signed, Erma Heritage, PA-C 12/10/2018, 11:10 AM Pager: [phone]

## 2018-12-17 ENCOUNTER — Ambulatory Visit: Payer: Medicare HMO | Admitting: Student

## 2018-12-18 NOTE — H&P (Signed)
Addendum for ED H&P   Review of Systems: Review of Systems  Constitutional: Negative for fever and malaise/fatigue.  Respiratory: Positive for shortness of breath (with exertion ). Negative for cough.   Cardiovascular: Negative for chest pain and leg swelling.  Gastrointestinal: Negative for abdominal pain and nausea.  Genitourinary: Negative for dysuria.  Musculoskeletal: Negative for myalgias.  Neurological: Positive for dizziness and loss of consciousness.  Psychiatric/Behavioral: Negative for depression and substance abuse.    Physical Exam  Constitutional: He is oriented to person, place, and time and well-developed, well-nourished, and in no distress.  HENT:  Head: Normocephalic and atraumatic.  Eyes: EOM are normal.  Neck: Normal range of motion.  Cardiovascular: Normal rate, regular rhythm, normal heart sounds and intact distal pulses. Exam reveals no gallop and no friction rub.  No murmur heard. Pulmonary/Chest: Effort normal and breath sounds normal. No respiratory distress. He exhibits no tenderness.  Abdominal: Soft. He exhibits no distension. There is no abdominal tenderness.  Musculoskeletal: Normal range of motion.        General: No tenderness or edema.  Neurological: He is alert and oriented to person, place, and time.  Skin: Skin is warm and dry.    Marianna Payment, D.O. [facility], PGY-1 Pager: [phone]

## 2018-12-24 ENCOUNTER — Other Ambulatory Visit: Payer: Self-pay | Admitting: Nurse Practitioner

## 2018-12-24 DIAGNOSIS — F321 Major depressive disorder, single episode, moderate: Secondary | ICD-10-CM

## 2018-12-24 NOTE — Telephone Encounter (Signed)
Ov 01/24/19

## 2019-01-12 ENCOUNTER — Other Ambulatory Visit: Payer: Self-pay | Admitting: Nurse Practitioner

## 2019-01-12 DIAGNOSIS — I10 Essential (primary) hypertension: Secondary | ICD-10-CM

## 2019-01-18 ENCOUNTER — Other Ambulatory Visit: Payer: Self-pay | Admitting: Nurse Practitioner

## 2019-01-18 DIAGNOSIS — F321 Major depressive disorder, single episode, moderate: Secondary | ICD-10-CM

## 2019-01-21 ENCOUNTER — Other Ambulatory Visit: Payer: Self-pay | Admitting: Nurse Practitioner

## 2019-01-21 DIAGNOSIS — F321 Major depressive disorder, single episode, moderate: Secondary | ICD-10-CM

## 2019-01-23 DIAGNOSIS — M5416 Radiculopathy, lumbar region: Secondary | ICD-10-CM | POA: Diagnosis not present

## 2019-01-23 DIAGNOSIS — M25551 Pain in right hip: Secondary | ICD-10-CM | POA: Diagnosis not present

## 2019-01-23 DIAGNOSIS — M2550 Pain in unspecified joint: Secondary | ICD-10-CM | POA: Diagnosis not present

## 2019-01-23 DIAGNOSIS — M25561 Pain in right knee: Secondary | ICD-10-CM | POA: Diagnosis not present

## 2019-01-23 DIAGNOSIS — M150 Primary generalized (osteo)arthritis: Secondary | ICD-10-CM | POA: Diagnosis not present

## 2019-01-23 DIAGNOSIS — M25562 Pain in left knee: Secondary | ICD-10-CM | POA: Diagnosis not present

## 2019-01-23 DIAGNOSIS — L4059 Other psoriatic arthropathy: Secondary | ICD-10-CM | POA: Diagnosis not present

## 2019-01-23 DIAGNOSIS — Z79899 Other long term (current) drug therapy: Secondary | ICD-10-CM | POA: Diagnosis not present

## 2019-01-24 ENCOUNTER — Ambulatory Visit (INDEPENDENT_AMBULATORY_CARE_PROVIDER_SITE_OTHER): Payer: Medicare HMO | Admitting: Nurse Practitioner

## 2019-01-24 ENCOUNTER — Encounter: Payer: Self-pay | Admitting: Nurse Practitioner

## 2019-01-24 DIAGNOSIS — E291 Testicular hypofunction: Secondary | ICD-10-CM | POA: Diagnosis not present

## 2019-01-24 DIAGNOSIS — R609 Edema, unspecified: Secondary | ICD-10-CM | POA: Diagnosis not present

## 2019-01-24 DIAGNOSIS — N401 Enlarged prostate with lower urinary tract symptoms: Secondary | ICD-10-CM

## 2019-01-24 DIAGNOSIS — R739 Hyperglycemia, unspecified: Secondary | ICD-10-CM | POA: Diagnosis not present

## 2019-01-24 DIAGNOSIS — I10 Essential (primary) hypertension: Secondary | ICD-10-CM | POA: Diagnosis not present

## 2019-01-24 DIAGNOSIS — E785 Hyperlipidemia, unspecified: Secondary | ICD-10-CM | POA: Diagnosis not present

## 2019-01-24 DIAGNOSIS — K219 Gastro-esophageal reflux disease without esophagitis: Secondary | ICD-10-CM

## 2019-01-24 DIAGNOSIS — F321 Major depressive disorder, single episode, moderate: Secondary | ICD-10-CM | POA: Diagnosis not present

## 2019-01-24 MED ORDER — ESCITALOPRAM OXALATE 20 MG PO TABS: 20.0000 mg | ORAL_TABLET | Freq: Every day | ORAL | 1 refills | Status: DC

## 2019-01-24 MED ORDER — OLMESARTAN MEDOXOMIL 40 MG PO TABS: 20.0000 mg | ORAL_TABLET | Freq: Every day | ORAL | 0 refills | Status: DC

## 2019-01-24 MED ORDER — ATORVASTATIN CALCIUM 40 MG PO TABS: 40.0000 mg | ORAL_TABLET | Freq: Every day | ORAL | 1 refills | Status: DC

## 2019-01-24 MED ORDER — PANTOPRAZOLE SODIUM 40 MG PO TBEC: 40.0000 mg | DELAYED_RELEASE_TABLET | Freq: Two times a day (BID) | ORAL | 1 refills | Status: DC

## 2019-01-24 MED ORDER — ACCU-CHEK GUIDE VI STRP: ORAL_STRIP | 12 refills | Status: DC

## 2019-01-24 MED ORDER — TESTOSTERONE 40.5 MG/2.5GM (1.62%) TD GEL: 1.0000 "application " | Freq: Every day | TRANSDERMAL | 2 refills | Status: DC

## 2019-01-24 NOTE — Progress Notes (Signed)
Virtual Visit via telephone Note Due to COVID-19 pandemic this visit was conducted virtually. This visit type was conducted due to national recommendations for restrictions regarding the COVID-19 Pandemic (e.g. social distancing, sheltering in place) in an effort to limit this patient's exposure and mitigate transmission in our community. All issues noted in this document were discussed and addressed.  A physical exam was not performed with this format.  I connected with Rodney Hernandez on 01/24/19 at 10:50 by telephone and verified that I am speaking with the correct person using two identifiers. Rodney Hernandez is currently located at home and his daughter is currently with him during visit. The provider, Mary-Margaret Hassell Done, FNP is located in their office at time of visit.  I discussed the limitations, risks, security and privacy concerns of performing an evaluation and management service by telephone and the availability of in person appointments. I also discussed with the patient that there may be a patient responsible charge related to this service. The patient expressed understanding and agreed to proceed.   History and Present Illness:   Chief Complaint: Medical Management of Chronic Issues    HPI:  1. Essential hypertension No c/o chest pain, sob or headache. Checks blood pressure at home. Running below 161 systolic BP Readings from Last 3 Encounters:  12/03/18 (!) 124/54  11/05/18 (!) 147/61  02/12/18 (!) 143/82     2. Gastroesophageal reflux disease without esophagitis Gastric reflux is some better. He tries to avoid foods that cause flare up.  3. Benign prostatic hyperplasia with lower urinary tract symptoms, symptom details unspecified No problems voiding.  4. Hyperlipidemia with target LDL less than 100 Tries to watch diet and stay very active. Lab Results  Component Value Date   CHOL 108 02/12/2018   HDL 43 02/12/2018   LDLCALC 44 02/12/2018   TRIG 103 02/12/2018   CHOLHDL 2.5 02/12/2018     5. Peripheral edema Has had no recent swelling lower ext.    Outpatient Encounter Medications as of 01/24/2019  Medication Sig  . albuterol (VENTOLIN HFA) 108 (90 Base) MCG/ACT inhaler TAKE 2 PUFFS BY MOUTH EVERY 6 HOURS AS NEEDED FOR WHEEZE OR SHORTNESS OF BREATH (Patient taking differently: Inhale 2 puffs into the lungs every 6 (six) hours as needed for wheezing or shortness of breath. )  . atorvastatin (LIPITOR) 40 MG tablet TAKE 1 TABLET BY MOUTH EVERY DAY IN THE EVENING (Patient taking differently: Take 40 mg by mouth daily. )  . blood glucose meter kit and supplies KIT Dispense based on patient and insurance preference. Use up to four times daily as directed. (FOR ICD-9 250.00, 250.01).  . chlorthalidone (HYGROTON) 25 MG tablet Take 0.5 tablets (12.5 mg total) by mouth daily.  Marland Kitchen escitalopram (LEXAPRO) 20 MG tablet TAKE 1 TABLET BY MOUTH EVERY DAY  . folic acid (FOLVITE) 1 MG tablet Take 1 mg by mouth daily.   . methotrexate 25 MG/ML injection Inject 50 mg/m2 as directed once a week.   . olmesartan (BENICAR) 40 MG tablet Take 0.5 tablets (20 mg total) by mouth daily.  . pantoprazole (PROTONIX) 40 MG tablet Take 1 tablet (40 mg total) by mouth 2 (two) times daily.  . sucralfate (CARAFATE) 1 g tablet Take 1 tablet (1 g total) by mouth daily as needed.  . Testosterone (ANDROGEL) 40.5 MG/2.5GM (1.62%) GEL Apply 1 application topically daily.  . traMADol (ULTRAM) 50 MG tablet Take 50-150 mg by mouth 2 (two) times daily as needed (pain).  Past Surgical History:  Procedure Laterality Date  . collapsed lung    . HERNIA REPAIR    . PROSTATE SURGERY      Family History  Problem Relation Age of Onset  . Lung cancer Father   . Prostate cancer Father   . Stomach cancer Maternal Grandmother   . Prostate cancer Paternal Uncle   . Cervical cancer Daughter   . Psoriasis Daughter   . Arthritis Daughter        psoriatic arthritis    New complaints: None  today- he saw pulmonology and had breathing test which caused him to pass out.  Social history: Lives by hisself. His daughters check on him daily.  Controlled substance contract: n/a     Review of Systems  Constitutional: Negative for diaphoresis and weight loss.  Eyes: Negative for blurred vision, double vision and pain.  Respiratory: Positive for shortness of breath.   Cardiovascular: Negative for chest pain, palpitations, orthopnea and leg swelling.  Gastrointestinal: Negative for abdominal pain.  Skin: Negative for rash.  Neurological: Negative for dizziness, sensory change, loss of consciousness, weakness and headaches.  Endo/Heme/Allergies: Negative for polydipsia. Does not bruise/bleed easily.  Psychiatric/Behavioral: Negative for memory loss. The patient does not have insomnia.   All other systems reviewed and are negative.    Observations/Objective: Alert and oriented- answers all questions appropriately No distress No SOB noted during conversation  Assessment and Plan: Rodney Hernandez comes in today with chief complaint of Medical Management of Chronic Issues   Diagnosis and orders addressed:  1. Essential hypertension Low sodium diet Continue to check blood pressure at homeoccasionally - olmesartan (BENICAR) 40 MG tablet; Take 0.5 tablets (20 mg total) by mouth daily.  Dispense: 90 tablet; Refill: 0  2. Gastroesophageal reflux disease without esophagitis Avoid spicy foods Do not eat 2 hours prior to bedtime - pantoprazole (PROTONIX) 40 MG tablet; Take 1 tablet (40 mg total) by mouth 2 (two) times daily.  Dispense: 180 tablet; Refill: 1  3. Benign prostatic hyperplasia with lower urinary tract symptoms, symptom details unspecified Keep follow up with urology - PSA, total and free; Future  4. Hyperlipidemia with target LDL less than 100 Low fat diet - atorvastatin (LIPITOR) 40 MG tablet; Take 1 tablet (40 mg total) by mouth daily.  Dispense: 90 tablet; Refill:  1  5. Peripheral edema Elevate legs if occurs  6. Depression, major, single episode, moderate (HCC) Stress management - escitalopram (LEXAPRO) 20 MG tablet; Take 1 tablet (20 mg total) by mouth daily.  Dispense: 90 tablet; Refill: 1  7. Hypogonadism in male - Testosterone (ANDROGEL) 40.5 MG/2.5GM (1.62%) GEL; Apply 1 application topically daily.  Dispense: 7.5 g; Refill: 2  8. Hyperglycemia Keep check of blood sugars fatsing daily- bring with you to next appointmnet - glucose blood (ACCU-CHEK GUIDE) test strip; Use as instructed  Dispense: 100 each; Refill: 12   Labs pending Health Maintenance reviewed Diet and exercise encouraged  Follow up plan: 3 months     I discussed the assessment and treatment plan with the patient. The patient was provided an opportunity to ask questions and all were answered. The patient agreed with the plan and demonstrated an understanding of the instructions.   The patient was advised to call back or seek an in-person evaluation if the symptoms worsen or if the condition fails to improve as anticipated.  The above assessment and management plan was discussed with the patient. The patient verbalized understanding of and has agreed to the management  plan. Patient is aware to call the clinic if symptoms persist or worsen. Patient is aware when to return to the clinic for a follow-up visit. Patient educated on when it is appropriate to go to the emergency department.   Time call ended:  11:10  I provided 20 minutes of non-face-to-face time during this encounter.    Mary-Margaret Hassell Done, FNP

## 2019-01-28 ENCOUNTER — Other Ambulatory Visit: Payer: Self-pay

## 2019-01-28 ENCOUNTER — Other Ambulatory Visit: Payer: Medicare HMO

## 2019-01-28 ENCOUNTER — Encounter: Payer: Self-pay | Admitting: Nurse Practitioner

## 2019-01-28 DIAGNOSIS — N401 Enlarged prostate with lower urinary tract symptoms: Secondary | ICD-10-CM | POA: Diagnosis not present

## 2019-01-28 DIAGNOSIS — L4059 Other psoriatic arthropathy: Secondary | ICD-10-CM | POA: Diagnosis not present

## 2019-01-29 LAB — PSA, TOTAL AND FREE
PSA, Free Pct: 18.9 %
PSA, Free: 2.23 ng/mL
Prostate Specific Ag, Serum: 11.8 ng/mL — ABNORMAL HIGH (ref 0.0–4.0)

## 2019-02-01 ENCOUNTER — Other Ambulatory Visit: Payer: Self-pay | Admitting: Nurse Practitioner

## 2019-02-01 DIAGNOSIS — I10 Essential (primary) hypertension: Secondary | ICD-10-CM

## 2019-02-03 ENCOUNTER — Telehealth: Payer: Self-pay

## 2019-02-03 NOTE — Telephone Encounter (Signed)

## 2019-02-05 ENCOUNTER — Telehealth (INDEPENDENT_AMBULATORY_CARE_PROVIDER_SITE_OTHER): Payer: Medicare HMO | Admitting: Student

## 2019-02-05 ENCOUNTER — Encounter: Payer: Self-pay | Admitting: Student

## 2019-02-05 VITALS — BP 147/74 | HR 65 | Ht 67.5 in | Wt 175.0 lb

## 2019-02-05 DIAGNOSIS — R0600 Dyspnea, unspecified: Secondary | ICD-10-CM | POA: Diagnosis not present

## 2019-02-05 DIAGNOSIS — E785 Hyperlipidemia, unspecified: Secondary | ICD-10-CM

## 2019-02-05 DIAGNOSIS — I10 Essential (primary) hypertension: Secondary | ICD-10-CM

## 2019-02-05 DIAGNOSIS — R0609 Other forms of dyspnea: Secondary | ICD-10-CM

## 2019-02-05 NOTE — Patient Instructions (Signed)
Medication Instructions:  Your physician recommends that you continue on your current medications as directed. Please refer to the Current Medication list given to you today.   Labwork: NONE  Testing/Procedures: NONE  Follow-Up: Your physician wants you to follow-up in: 1 YEAR.  You will receive a reminder letter in the mail two months in advance. If you don't receive a letter, please call our office to schedule the follow-up appointment.   Any Other Special Instructions Will Be Listed Below (If Applicable).  PLEASE KEEP BLOOD PRESSURE LOG FOR 3 WEEKS- THEN CALL OFFICE OR SEND THROUGH MYCHART   If you need a refill on your cardiac medications before your next appointment, please call your pharmacy.

## 2019-02-05 NOTE — Progress Notes (Signed)
Virtual Visit via Telephone Note   This visit type was conducted due to national recommendations for restrictions regarding the COVID-19 Pandemic (e.g. social distancing) in an effort to limit this patient's exposure and mitigate transmission in our community.  Due to his co-morbid illnesses, this patient is at least at moderate risk for complications without adequate follow up.  This format is felt to be most appropriate for this patient at this time.  The patient did not have access to video technology/had technical difficulties with video requiring transitioning to audio format only (telephone).  All issues noted in this document were discussed and addressed.  No physical exam could be performed with this format.  Please refer to the patient's chart for his  consent to telehealth for [facility].   Date:  02/05/2019   ID:  Rodney Hernandez, DOB 1945/07/20, MRN [id number]  Patient Location: Home Provider Location: Office  PCP:  Chevis Pretty, FNP  Cardiologist:  [name], MD  Electrophysiologist:  None   Evaluation Performed:  Follow-Up Visit  Chief Complaint:  55-monthvisit  History of Present Illness:    Rodney Votawis a 74y.o. male with past medical history of HTN, HLD and prior tobacco use who presents for a 32-monthollow-up telehealth visit.   He was last examined by Dr. BrHarl Bowien 10/2018 and reported worsening dyspnea on exertion. Given his productive cough and former tobacco use, PFT's were ordered for initial assessment. [facility] and while there he had a syncopal episode while undergoing testing with LOC for a few seconds. It was felt his episode was likely vasovagal but given his symptoms, he was admitted for further observation. Cardiology was consulted with plans for an echocardiogram and stress testing while admitted given his initial dyspnea on exertion. Echo showed a preserved EF of 60-65% with no regional WMA. NST  showed a moderate fixed defect which was thought to be most consistent with artifact and was overall a low-risk study.   In talking with the patient today, he reports having baseline dyspnea on exertion but feels like this has improved since his last visit. He denies any associated chest pain or palpitations. No recent orthopnea, PND or lower extremity edema. He has been experiencing sinus congestion and a dry cough for the past several weeks. Denies any associated fever, chills or sick contacts.  He reports his Chlorthalidone and Olmesartan were reduced during his admission secondary to hypotension and concern for dehydration. BP was elevated at 147/74 when checked this morning but he reports SBP is typically in the 120's to 130's. SBP was dropping into the 90's when he was previously taking Chlorthalidone 25 mg daily.  The patient does not have symptoms concerning for COVID-19 infection (fever, chills, cough, or new shortness of breath).    Past Medical History:  Diagnosis Date  . Arthritis    psoriatic arthritis and osteo arthritis  . Asthma   . Collapse of lung   . Colon polyps   . DOE (dyspnea on exertion) 12/02/2018  . Eczema   . GERD (gastroesophageal reflux disease)   . Hypertension   . Seasonal allergies   . Vertigo    Past Surgical History:  Procedure Laterality Date  . collapsed lung    . HERNIA REPAIR    . PROSTATE SURGERY       Current Meds  Medication Sig  . albuterol (VENTOLIN HFA) 108 (90 Base) MCG/ACT inhaler TAKE 2 PUFFS BY MOUTH EVERY 6  HOURS AS NEEDED FOR WHEEZE OR SHORTNESS OF BREATH (Patient taking differently: Inhale 2 puffs into the lungs every 6 (six) hours as needed for wheezing or shortness of breath. )  . atorvastatin (LIPITOR) 40 MG tablet Take 1 tablet (40 mg total) by mouth daily.  . blood glucose meter kit and supplies KIT Dispense based on patient and insurance preference. Use up to four times daily as directed. (FOR ICD-9 250.00, 250.01).  .  chlorthalidone (HYGROTON) 25 MG tablet Take 0.5 tablets (12.5 mg total) by mouth daily.  Marland Kitchen escitalopram (LEXAPRO) 20 MG tablet Take 1 tablet (20 mg total) by mouth daily.  . folic acid (FOLVITE) 1 MG tablet Take 1 mg by mouth daily.   Marland Kitchen glucose blood (ACCU-CHEK GUIDE) test strip Use as instructed  . methotrexate 25 MG/ML injection Inject 50 mg/m2 as directed once a week.   . olmesartan (BENICAR) 40 MG tablet TAKE 1 TABLET BY MOUTH EVERY DAY (Patient taking differently: 20 mg. )  . pantoprazole (PROTONIX) 40 MG tablet Take 1 tablet (40 mg total) by mouth 2 (two) times daily.  . sucralfate (CARAFATE) 1 g tablet Take 1 tablet (1 g total) by mouth daily as needed.  . Testosterone (ANDROGEL) 40.5 MG/2.5GM (1.62%) GEL Apply 1 application topically daily.  . [DISCONTINUED] traMADol (ULTRAM) 50 MG tablet Take 50-150 mg by mouth 2 (two) times daily as needed (pain).      Allergies:   Nsaids, Aspirin, Fluticasone, Morphine and related, and Sulfa antibiotics   Social History   Tobacco Use  . Smoking status: Former Smoker    Packs/day: 2.00    Years: 22.00    Pack years: 44.00    Types: Cigarettes    Quit date: 01/09/1981    Years since quitting: 38.0  . Smokeless tobacco: Never Used  Substance Use Topics  . Alcohol use: No  . Drug use: No     Family Hx: The patient's family history includes Arthritis in his daughter; Cervical cancer in his daughter; Lung cancer in his father; Prostate cancer in his father and paternal uncle; Psoriasis in his daughter; Stomach cancer in his maternal grandmother.  ROS:   Please see the history of present illness.     All other systems reviewed and are negative.   Prior CV studies:   The following studies were reviewed today:  NST: 11/2018  There was no ST segment deviation noted during stress.  The left ventricular ejection fraction is normal (55-65%).  Nuclear stress EF: 60%.  Defect 1: There is a medium fixed defect of moderate severity present  in the basal inferoseptal, basal inferior, mid inferoseptal, mid inferior and apical inferior location. Given fixed defect with normal wall motion in this area, consistent with artifact  No T wave inversion was noted during stress.  The study is normal.  This is a low risk study.   Echocardiogram: 11/2018 IMPRESSIONS    1. Left ventricular ejection fraction, by visual estimation, is 60 to 65%. The left ventricle has normal function. There is no left ventricular hypertrophy.  2. The left ventricle has no regional wall motion abnormalities.  3. Global right ventricle has normal systolic function.The right ventricular size is normal. No increase in right ventricular wall thickness.  4. Left atrial size was normal.  5. Right atrial size was normal.  6. The mitral valve is normal in structure. No evidence of mitral valve regurgitation. No evidence of mitral stenosis.  7. The tricuspid valve is normal in structure. Tricuspid valve  regurgitation is not demonstrated.  8. The aortic valve is normal in structure. Aortic valve regurgitation is not visualized. No evidence of aortic valve sclerosis or stenosis.  9. The pulmonic valve was normal in structure. Pulmonic valve regurgitation is not visualized. 10. The inferior vena cava is normal in size with greater than 50% respiratory variability, suggesting right atrial pressure of 3 mmHg.  Labs/Other Tests and Data Reviewed:    EKG:  An ECG dated 12/02/2018 was personally reviewed today and demonstrated:  NSR, HR 64 with no acute ST abnormalities.   Recent Labs: 12/02/2018: ALT 27 12/03/2018: BUN 22; Creatinine, Ser 1.42; Hemoglobin 12.2; Platelets 273; Potassium 3.8; Sodium 138   Recent Lipid Panel Lab Results  Component Value Date/Time   CHOL 108 02/12/2018 12:41 PM   CHOL 186 05/20/2012 10:28 AM   TRIG 103 02/12/2018 12:41 PM   TRIG 76 06/11/2014 12:41 PM   TRIG 164 (H) 05/20/2012 10:28 AM   HDL 43 02/12/2018 12:41 PM   HDL 39 (L)  06/11/2014 12:41 PM   HDL 42 05/20/2012 10:28 AM   CHOLHDL 2.5 02/12/2018 12:41 PM   LDLCALC 44 02/12/2018 12:41 PM   LDLCALC 89 01/20/2013 04:05 PM   LDLCALC 111 (H) 05/20/2012 10:28 AM    Wt Readings from Last 3 Encounters:  02/05/19 175 lb (79.4 kg)  12/03/18 181 lb 7 oz (82.3 kg)  11/05/18 180 lb (81.6 kg)     Objective:    Vital Signs:  BP (!) 147/74   Pulse 65   Ht 5' 7.5" (1.715 m)   Wt 175 lb (79.4 kg)   BMI 27.00 kg/m    General: Pleasant male sounding in NAD Psych: Normal affect. Neuro: Alert and oriented X 3.  Lungs:  Resp regular and unlabored while talking on the phone.   ASSESSMENT & PLAN:    1. Dyspnea on Exertion - He recently underwent stress testing during admission which showed a fixed defect thought to be most consistent with artifact but no ischemia, overall being a low-risk study. Echocardiogram showed a preserved EF as outlined above with no regional wall motion abnormalities. PFT's at that time did show evidence of moderate obstructive pulmonary disease. I reviewed this with the patient today and he wishes to follow-up with his PCP initially to see if they wish to manage this versus referring to Pulmonology.  2. HTN - BP elevated at 147/74 when checked earlier today but he reports this has overall been well controlled with SBP in the 120's to 130's when checked on other days. Continue current regimen for now with Benicar 20 mg daily and Chlorthalidone 12.5 mg daily. I encouraged him to keep a BP log and if additional medical therapy is needed in the future, would prefer titration of Benicar to 40 mg daily as he did experience dehydration with higher dosing of Chlorthalidone.  3. HLD - Followed by PCP.  FLP in 02/2018 showed total cholesterol 108, triglycerides 103, HDL 43 and LDL 44.  He remains on Atorvastatin 40 mg daily.  COVID-19 Education: The signs and symptoms of COVID-19 were discussed with the patient and how to seek care for testing (follow up  with PCP or arrange E-visit). The importance of social distancing was discussed today.  Time:   Today, I have spent 21 minutes with the patient with telehealth technology discussing the above problems.     Medication Adjustments/Labs and Tests Ordered: Current medicines are reviewed at length with the patient today.  Concerns regarding medicines are  outlined above.   Tests Ordered: No orders of the defined types were placed in this encounter.   Medication Changes: No orders of the defined types were placed in this encounter.   Follow Up: Report back with BP Log in 2-3 weeks. Follow-up in 1 year.    Signed, Erma Heritage, PA-C  02/05/2019 5:05 PM    [place]

## 2019-02-20 ENCOUNTER — Other Ambulatory Visit: Payer: Self-pay | Admitting: Nurse Practitioner

## 2019-03-05 DIAGNOSIS — Z85828 Personal history of other malignant neoplasm of skin: Secondary | ICD-10-CM | POA: Diagnosis not present

## 2019-03-05 DIAGNOSIS — L570 Actinic keratosis: Secondary | ICD-10-CM | POA: Diagnosis not present

## 2019-03-05 DIAGNOSIS — L821 Other seborrheic keratosis: Secondary | ICD-10-CM | POA: Diagnosis not present

## 2019-04-02 ENCOUNTER — Other Ambulatory Visit: Payer: Self-pay | Admitting: Nurse Practitioner

## 2019-04-02 DIAGNOSIS — I10 Essential (primary) hypertension: Secondary | ICD-10-CM

## 2019-04-22 ENCOUNTER — Other Ambulatory Visit: Payer: Self-pay | Admitting: Nurse Practitioner

## 2019-04-22 DIAGNOSIS — I10 Essential (primary) hypertension: Secondary | ICD-10-CM

## 2019-04-24 ENCOUNTER — Encounter: Payer: Self-pay | Admitting: Nurse Practitioner

## 2019-04-24 ENCOUNTER — Ambulatory Visit (INDEPENDENT_AMBULATORY_CARE_PROVIDER_SITE_OTHER): Payer: Medicare HMO | Admitting: Nurse Practitioner

## 2019-04-24 DIAGNOSIS — R0609 Other forms of dyspnea: Secondary | ICD-10-CM

## 2019-04-24 DIAGNOSIS — L4050 Arthropathic psoriasis, unspecified: Secondary | ICD-10-CM | POA: Diagnosis not present

## 2019-04-24 DIAGNOSIS — R739 Hyperglycemia, unspecified: Secondary | ICD-10-CM

## 2019-04-24 DIAGNOSIS — I10 Essential (primary) hypertension: Secondary | ICD-10-CM

## 2019-04-24 DIAGNOSIS — N401 Enlarged prostate with lower urinary tract symptoms: Secondary | ICD-10-CM | POA: Diagnosis not present

## 2019-04-24 DIAGNOSIS — C61 Malignant neoplasm of prostate: Secondary | ICD-10-CM

## 2019-04-24 DIAGNOSIS — R0600 Dyspnea, unspecified: Secondary | ICD-10-CM | POA: Diagnosis not present

## 2019-04-24 DIAGNOSIS — R609 Edema, unspecified: Secondary | ICD-10-CM

## 2019-04-24 DIAGNOSIS — K219 Gastro-esophageal reflux disease without esophagitis: Secondary | ICD-10-CM

## 2019-04-24 DIAGNOSIS — E785 Hyperlipidemia, unspecified: Secondary | ICD-10-CM

## 2019-04-24 DIAGNOSIS — J4520 Mild intermittent asthma, uncomplicated: Secondary | ICD-10-CM

## 2019-04-24 DIAGNOSIS — I700 Atherosclerosis of aorta: Secondary | ICD-10-CM | POA: Diagnosis not present

## 2019-04-24 NOTE — Progress Notes (Signed)
Virtual Visit via telephone Note Due to COVID-19 pandemic this visit was conducted virtually. This visit type was conducted due to national recommendations for restrictions regarding the COVID-19 Pandemic (e.g. social distancing, sheltering in place) in an effort to limit this patient's exposure and mitigate transmission in our community. All issues noted in this document were discussed and addressed.  A physical exam was not performed with this format.  I connected with Rodney Hernandez on 04/24/19 at 1:50 by telephone and verified that I am speaking with the correct person using two identifiers. Rodney Hernandez is currently located at home and no one is currently with her during visit. The provider, Rodney Hassell Done, FNP is located in their office at time of visit.  I discussed the limitations, risks, security and privacy concerns of performing an evaluation and management service by telephone and the availability of in person appointments. I also discussed with the patient that there may be a patient responsible charge related to this service. The patient expressed understanding and agreed to proceed.   History and Present Illness:   Chief Complaint: Medical Management of Chronic Issues    HPI:  1. Essential hypertension No c/o chest pain or headaches. Checks blood pressure occasionally at home and usually runs 440 systolic and below most of the time.  BP Readings from Last 3 Encounters:  02/05/19 (!) 147/74  12/03/18 (!) 124/54  11/05/18 (!) 147/61     2. Aortic atherosclerosis ([place]) Seen on chest xray- are currently monitoring  3. Mild intermittent chronic asthma without complication Has SOB and occasional cough. He has not needed to use his albuterol lately  4. Gastroesophageal reflux disease without esophagitis Is on protinix and that is working well for him.  5. Psoriatic arthritis ([place]) Hurts all the time. He was on methotrexate because it made him feel lik ehe was walking  around in a fog. He does not want to see rheumatologist because ehe says there is nothing they can do.  6. Benign prostatic hyperplasia with lower urinary tract symptoms, symptom details unspecified  7. Prostate cancer ([place]) No problems voidiing. He follow up with urology in June.  8. DOE (dyspnea on exertion) Has had for some time coming from his asthma.  9. Hyperlipidemia with target LDL less than 100 Tries to watch diet. Does some exercise  10. Peripheral edema Denies any edema in lower ext lately  11. Hyperglycemia His blood sugars have been running aorund 120 in mornings when fasting. Lab Results  Component Value Date   HGBA1C 7.6 (H) 12/03/2018       Outpatient Encounter Medications as of 04/24/2019  Medication Sig  . albuterol (VENTOLIN HFA) 108 (90 Base) MCG/ACT inhaler Inhale 2 puffs into the lungs every 6 (six) hours as needed for wheezing or shortness of breath.  Marland Kitchen atorvastatin (LIPITOR) 40 MG tablet Take 1 tablet (40 mg total) by mouth daily.  . blood glucose meter kit and supplies KIT Dispense based on patient and insurance preference. Use up to four times daily as directed. (FOR ICD-9 250.00, 250.01).  . chlorthalidone (HYGROTON) 25 MG tablet Take 0.5 tablets (12.5 mg total) by mouth daily.  Marland Kitchen escitalopram (LEXAPRO) 20 MG tablet Take 1 tablet (20 mg total) by mouth daily.  . folic acid (FOLVITE) 1 MG tablet Take 1 mg by mouth daily.   Marland Kitchen glucose blood (ACCU-CHEK GUIDE) test strip Use as instructed  . methotrexate 25 MG/ML injection Inject 50 mg/m2 as directed once a week.   . olmesartan (BENICAR)  40 MG tablet TAKE 1 TABLET BY MOUTH EVERY DAY  . pantoprazole (PROTONIX) 40 MG tablet Take 1 tablet (40 mg total) by mouth 2 (two) times daily.  . sucralfate (CARAFATE) 1 g tablet Take 1 tablet (1 g total) by mouth daily as needed.  . Testosterone (ANDROGEL) 40.5 MG/2.5GM (1.62%) GEL Apply 1 application topically daily.   No facility-administered encounter medications on  file as of 04/24/2019.    Past Surgical History:  Procedure Laterality Date  . collapsed lung    . HERNIA REPAIR    . PROSTATE SURGERY      Family History  Problem Relation Age of Onset  . Lung cancer Father   . Prostate cancer Father   . Stomach cancer Maternal Grandmother   . Prostate cancer Paternal Uncle   . Cervical cancer Daughter   . Psoriasis Daughter   . Arthritis Daughter        psoriatic arthritis    New complaints: None today  Social history: Lives by hisself but his daughters live close by.  Controlled substance contract:  n/a    Review of Systems  Constitutional: Negative for diaphoresis and weight loss.  Eyes: Negative for blurred vision, double vision and pain.  Respiratory: Negative for shortness of breath.   Cardiovascular: Negative for chest pain, palpitations, orthopnea and leg swelling.  Gastrointestinal: Negative for abdominal pain.  Genitourinary: Negative for dysuria, frequency and urgency.  Musculoskeletal: Positive for joint pain.  Skin: Negative for rash.  Neurological: Negative for dizziness, sensory change, loss of consciousness, weakness and headaches.  Endo/Heme/Allergies: Negative for polydipsia. Does not bruise/bleed easily.  Psychiatric/Behavioral: Negative for memory loss. The patient does not have insomnia.   All other systems reviewed and are negative.    Observations/Objective: Alert and oriented- answers all questions appropriately No distress    Assessment and Plan: Rodney Hernandez comes in today with chief complaint of Medical Management of Chronic Issues   Diagnosis and orders addressed:  1. Essential hypertension Low sodium diet - CBC with Differential/Platelet - CMP14+EGFR - Lipid panel  2. Aortic atherosclerosis ([place]) Will repeat chest xray at next visit  3. Mild intermittent chronic asthma without complication Avoid allergens  4. Gastroesophageal reflux disease without esophagitis Avoid spicy foods Do  not eat 2 hours prior to bedtime  5. Psoriatic arthritis ([place]) Refuses to see rheumatologist Doe snot want any medication at this time  6. Benign prostatic hyperplasia with lower urinary tract symptoms, symptom details unspecified Keep follow up appointment with urology  7. Prostate cancer [facility]) Urology will check  8. DOE (dyspnea on exertion)  9. Hyperlipidemia with target LDL less than 100 Low fat diet  10. Peripheral edema Elevate legs when sitting  11. Hyperglycemia Continue to watch blood sugars   Labs pending Health Maintenance reviewed Diet and exercise encouraged  Follow up plan: 3 months    I discussed the assessment and treatment plan with the patient. The patient was provided an opportunity to ask questions and all were answered. The patient agreed with the plan and demonstrated an understanding of the instructions.   The patient was advised to call back or seek an in-person evaluation if the symptoms worsen or if the condition fails to improve as anticipated.  The above assessment and management plan was discussed with the patient. The patient verbalized understanding of and has agreed to the management plan. Patient is aware to call the clinic if symptoms persist or worsen. Patient is aware when to return to the clinic for  a follow-up visit. Patient educated on when it is appropriate to go to the emergency department.   Time call ended:  2:10  I provided 20 minutes of non-face-to-face time during this encounter.    Rodney Hassell Done, FNP

## 2019-04-25 ENCOUNTER — Other Ambulatory Visit: Payer: Self-pay

## 2019-04-25 ENCOUNTER — Other Ambulatory Visit: Payer: Medicare HMO

## 2019-04-25 DIAGNOSIS — R739 Hyperglycemia, unspecified: Secondary | ICD-10-CM | POA: Diagnosis not present

## 2019-04-25 DIAGNOSIS — I10 Essential (primary) hypertension: Secondary | ICD-10-CM | POA: Diagnosis not present

## 2019-04-25 LAB — BAYER DCA HB A1C WAIVED: HB A1C (BAYER DCA - WAIVED): 6.6 % (ref <7.0)

## 2019-04-26 LAB — CBC WITH DIFFERENTIAL/PLATELET
Basophils Absolute: 0.1 10*3/uL (ref 0.0–0.2)
Basos: 1 %
EOS (ABSOLUTE): 0.2 10*3/uL (ref 0.0–0.4)
Eos: 2 %
Hematocrit: 43.2 % (ref 37.5–51.0)
Hemoglobin: 13.3 g/dL (ref 13.0–17.7)
Immature Grans (Abs): 0 10*3/uL (ref 0.0–0.1)
Immature Granulocytes: 0 %
Lymphocytes Absolute: 1.5 10*3/uL (ref 0.7–3.1)
Lymphs: 19 %
MCH: 26.1 pg — ABNORMAL LOW (ref 26.6–33.0)
MCHC: 30.8 g/dL — ABNORMAL LOW (ref 31.5–35.7)
MCV: 85 fL (ref 79–97)
Monocytes Absolute: 1.2 10*3/uL — ABNORMAL HIGH (ref 0.1–0.9)
Monocytes: 14 %
Neutrophils Absolute: 5 10*3/uL (ref 1.4–7.0)
Neutrophils: 64 %
Platelets: 316 10*3/uL (ref 150–450)
RBC: 5.1 x10E6/uL (ref 4.14–5.80)
RDW: 12.9 % (ref 11.6–15.4)
WBC: 8 10*3/uL (ref 3.4–10.8)

## 2019-04-26 LAB — CMP14+EGFR
ALT: 15 IU/L (ref 0–44)
AST: 17 IU/L (ref 0–40)
Albumin/Globulin Ratio: 1.7 (ref 1.2–2.2)
Albumin: 4 g/dL (ref 3.7–4.7)
Alkaline Phosphatase: 90 IU/L (ref 39–117)
BUN/Creatinine Ratio: 19 (ref 10–24)
BUN: 20 mg/dL (ref 8–27)
Bilirubin Total: 0.4 mg/dL (ref 0.0–1.2)
CO2: 28 mmol/L (ref 20–29)
Calcium: 10.9 mg/dL — ABNORMAL HIGH (ref 8.6–10.2)
Chloride: 101 mmol/L (ref 96–106)
Creatinine, Ser: 1.07 mg/dL (ref 0.76–1.27)
GFR calc Af Amer: 78 mL/min/{1.73_m2} (ref >59)
GFR calc non Af Amer: 67 mL/min/{1.73_m2} (ref >59)
Globulin, Total: 2.4 g/dL (ref 1.5–4.5)
Glucose: 146 mg/dL — ABNORMAL HIGH (ref 65–99)
Potassium: 4.1 mmol/L (ref 3.5–5.2)
Sodium: 141 mmol/L (ref 134–144)
Total Protein: 6.4 g/dL (ref 6.0–8.5)

## 2019-04-26 LAB — LIPID PANEL
Chol/HDL Ratio: 2.5 ratio (ref 0.0–5.0)
Cholesterol, Total: 98 mg/dL — ABNORMAL LOW (ref 100–199)
HDL: 39 mg/dL — ABNORMAL LOW (ref >39)
LDL Chol Calc (NIH): 39 mg/dL (ref 0–99)
Triglycerides: 108 mg/dL (ref 0–149)
VLDL Cholesterol Cal: 20 mg/dL (ref 5–40)

## 2019-05-08 ENCOUNTER — Ambulatory Visit (INDEPENDENT_AMBULATORY_CARE_PROVIDER_SITE_OTHER): Payer: Medicare HMO | Admitting: *Deleted

## 2019-05-08 VITALS — Ht 67.5 in | Wt 175.0 lb

## 2019-05-08 DIAGNOSIS — Z0000 Encounter for general adult medical examination without abnormal findings: Secondary | ICD-10-CM | POA: Diagnosis not present

## 2019-05-08 NOTE — Patient Instructions (Signed)
[place] Maintenance Summary and Written Plan of Care  Rodney Hernandez ,  Thank you for allowing me to perform your Medicare Annual Wellness Visit and for your ongoing commitment to your health.   Health Maintenance & Immunization History Health Maintenance  Topic Date Due  . INFLUENZA VACCINE  08/10/2019  . COLONOSCOPY  09/05/2020  . TETANUS/TDAP  03/02/2024  . Hepatitis C Screening  Completed  . PNA vac Low Risk Adult  Completed  . COVID-19 Vaccine  Discontinued   Immunization History  Administered Date(s) Administered  . Fluad Quad(high Dose 65+) 10/11/2018  . Influenza, High Dose Seasonal PF 10/20/2013, 10/11/2015, 10/11/2016, 11/09/2017  . Influenza,inj,Quad PF,6+ Mos 10/14/2014  . Pneumococcal Conjugate-13 03/02/2014  . Pneumococcal Polysaccharide-23 11/10/2011  . Td 03/02/2014    These are the patient goals that we discussed: Goals Addressed            This Visit's Progress   . Exercise 3x per week (30 min per time)       Increase walking to 30-45 minutes at least 3 days per week.   05/08/2019 AWV Goal: Exercise for General Health   Patient will verbalize understanding of the benefits of increased physical activity:  Exercising regularly is important. It will improve your overall fitness, flexibility, and endurance.  Regular exercise also will improve your overall health. It can help you control your weight, reduce stress, and improve your bone density.  Over the next year, patient will increase physical activity as tolerated with a goal of at least 150 minutes of moderate physical activity per week.   You can tell that you are exercising at a moderate intensity if your heart starts beating faster and you start breathing faster but can still hold a conversation.  Moderate-intensity exercise ideas include:  Walking 1 mile (1.6 km) in about 15 minutes  Biking  Hiking  Golfing  Dancing  Water aerobics  Patient will  verbalize understanding of everyday activities that increase physical activity by providing examples like the following: ? Yard work, such as: ? Pushing a Conservation officer, nature ? Raking and bagging leaves ? Washing your car ? Pushing a stroller ? Shoveling snow ? Gardening ? Washing windows or floors  Patient will be able to explain general safety guidelines for exercising:   Before you start a new exercise program, talk with your health care provider.  Do not exercise so much that you hurt yourself, feel dizzy, or get very short of breath.  Wear comfortable clothes and wear shoes with good support.  Drink plenty of water while you exercise to prevent dehydration or heat stroke.  Work out until your breathing and your heartbeat get faster.     . Have 3 meals a day       05/08/2019 AWV Goal: Improved Nutrition/Diet  . Patient will verbalize understanding that diet plays an important role in overall health and that a poor diet is a risk factor for many chronic medical conditions.  . Over the next year, patient will improve self management of their diet by incorporating better variety, improved meal pattern, more consistent meal timing, increased physical activity, better food choices, watch portion sizes/amount of food eaten at one time, and eat 6 small meals per day. . Patient will utilize available community resources to help with food acquisition if needed (ex: food pantries, [address], etc) . Patient will work with nutrition specialist if a referral was made         This  is a list of Health Maintenance Items that are overdue or due now: There are no preventive care reminders to display for this patient.   Orders/Referrals Placed Today: No orders of the defined types were placed in this encounter.  (Contact our referral department at [phone] if you have not spoken with someone about your referral appointment within the next 5 days)    Follow-up Plan Follow up with Mary-Margaret  Hassell Done, FNP as scheduled on 07/22/201 Bring in copy of Advance Directive to be scanned into your chart

## 2019-05-08 NOTE — Progress Notes (Signed)
MEDICARE ANNUAL WELLNESS VISIT  05/08/2019  Telephone Visit Disclaimer This Medicare AWV was conducted by telephone due to national recommendations for restrictions regarding the COVID-19 Pandemic (e.g. social distancing).  I verified, using two identifiers, that I am speaking with Rodney Hernandez or their authorized healthcare agent. I discussed the limitations, risks, security, and privacy concerns of performing an evaluation and management service by telephone and the potential availability of an in-person appointment in the future. The patient expressed understanding and agreed to proceed.   Subjective:  Rodney Hernandez is a 74 y.o. male patient of Chevis Pretty, [place] who had a Medicare Annual Wellness Visit today via telephone. Rodney Hernandez is Retired and lives alone. he has 2 children. he reports that he is socially active and does interact with friends/family regularly. he is minimally physically active and enjoys golfing.  Patient Care Team: Chevis Pretty, FNP as PCP - General (Nurse Practitioner) Arnoldo Lenis, MD as PCP - Cardiology (Cardiology) Prudencio Pair as Physician Assistant (Emergency Medicine) Henreitta Leber Marisue Brooklyn, MD as Referring Physician (Urology) Hayes Ludwig, MD as Referring Physician (Gastroenterology)  Advanced Directives 05/08/2019 12/03/2018 05/07/2018 05/07/2018 04/09/2017  Does Patient Have a Medical Advance Directive? Yes Yes - No Yes  Type of Advance Directive Living will;Healthcare Power of [place]  Does patient want to make changes to medical advance directive? No - Patient declined No - Patient declined - - -  Copy of [place] in Chart? No - copy requested No - copy requested - - No - copy requested  Would patient like information on creating a medical advance directive? - - Yes (MAU/Ambulatory/Procedural Areas - Information given) No - Patient declined -      Hospital Utilization Over the Past 12 Months: # of hospitalizations or ER visits: 1 # of surgeries: 0  Review of Systems    Patient reports that his overall health is better compared to last year.  History obtained from chart review and the patient General ROS: negative  Patient Reported Readings (BP, Pulse, CBG, Weight, etc) none  Pain Assessment Pain : No/denies pain     Current Medications & Allergies (verified) Allergies as of 05/08/2019      Reactions   Nsaids Shortness Of Breath   Aspirin Other (See Comments)   Affects patient breathing   Fluticasone Rash   Of face   Morphine And Related Hives, Itching   Sulfa Antibiotics       Medication List       Accurate as of May 08, 2019  2:05 PM. If you have any questions, ask your nurse or doctor.        STOP taking these medications   folic acid 1 MG tablet Commonly known as: FOLVITE   methotrexate 25 MG/ML injection   sucralfate 1 g tablet Commonly known as: CARAFATE     TAKE these medications   Accu-Chek Guide test strip Generic drug: glucose blood Use as instructed   albuterol 108 (90 Base) MCG/ACT inhaler Commonly known as: VENTOLIN HFA Inhale 2 puffs into the lungs every 6 (six) hours as needed for wheezing or shortness of breath.   atorvastatin 40 MG tablet Commonly known as: LIPITOR Take 1 tablet (40 mg total) by mouth daily.   blood glucose meter kit and supplies Kit Dispense based on patient and insurance preference. Use up to four times daily as directed. (FOR ICD-9 250.00, 250.01).   chlorthalidone  25 MG tablet Commonly known as: HYGROTON Take 0.5 tablets (12.5 mg total) by mouth daily.   escitalopram 20 MG tablet Commonly known as: LEXAPRO Take 1 tablet (20 mg total) by mouth daily.   olmesartan 40 MG tablet Commonly known as: BENICAR TAKE 1 TABLET BY MOUTH EVERY DAY What changed: how much to take   pantoprazole 40 MG tablet Commonly known as: PROTONIX Take 1 tablet (40 mg  total) by mouth 2 (two) times daily.   Testosterone 40.5 MG/2.5GM (1.62%) Gel Commonly known as: AndroGel Apply 1 application topically daily.   traMADol 50 MG tablet Commonly known as: ULTRAM       History (reviewed): Past Medical History:  Diagnosis Date  . Arthritis    psoriatic arthritis and osteo arthritis  . Asthma   . Collapse of lung   . Colon polyps   . DOE (dyspnea on exertion) 12/02/2018  . Eczema   . GERD (gastroesophageal reflux disease)   . Hypertension   . Seasonal allergies   . Vertigo    Past Surgical History:  Procedure Laterality Date  . collapsed lung    . HERNIA REPAIR    . PROSTATE SURGERY     Family History  Problem Relation Age of Onset  . Lung cancer Father   . Prostate cancer Father   . Stomach cancer Maternal Grandmother   . Prostate cancer Paternal Uncle   . Cervical cancer Daughter   . Psoriasis Daughter   . Arthritis Daughter        psoriatic arthritis   Social History   Socioeconomic History  . Marital status: Widowed    Spouse name: Not on file  . Number of children: 2  . Years of education: 1  . Highest education level: High school graduate  Occupational History  . Occupation: Retired    Comment: worked in a Writer for 33 years  Tobacco Use  . Smoking status: Former Smoker    Packs/day: 2.00    Years: 22.00    Pack years: 44.00    Types: Cigarettes    Quit date: 01/09/1981    Years since quitting: 38.3  . Smokeless tobacco: Never Used  Substance and Sexual Activity  . Alcohol use: No  . Drug use: No  . Sexual activity: Not Currently  Other Topics Concern  . Not on file  Social History Narrative   Widowed, 2 children   Right handed   12th grade   3 cups daily   Social Determinants of Health   Financial Resource Strain: Low Risk   . Difficulty of Paying Living Expenses: Not hard at all  Food Insecurity: No Food Insecurity  . Worried About Charity fundraiser in the Last Year: Never true  . Ran Out of  Food in the Last Year: Never true  Transportation Needs: No Transportation Needs  . Lack of Transportation (Medical): No  . Lack of Transportation (Non-Medical): No  Physical Activity: Inactive  . Days of Exercise per Week: 0 days  . Minutes of Exercise per Session: 0 min  Stress: No Stress Concern Present  . Feeling of Stress : Not at all  Social Connections: Moderately Isolated  . Frequency of Communication with Friends and Family: Once a week  . Frequency of Social Gatherings with Friends and Family: Once a week  . Attends Religious Services: More than 4 times per year  . Active Member of Clubs or Organizations: No  . Attends Archivist Meetings: Never  .  Marital Status: Widowed    Activities of Daily Living In your present state of health, do you have any difficulty performing the following activities: 05/08/2019 12/02/2018  Hearing? Tempie Donning  Vision? N N  Difficulty concentrating or making decisions? Y N  Walking or climbing stairs? N N  Dressing or bathing? N N  Doing errands, shopping? N N  Preparing Food and eating ? N -  Using the Toilet? N -  In the past six months, have you accidently leaked urine? N -  Do you have problems with loss of bowel control? N -  Managing your Medications? N -  Managing your Finances? N -  Housekeeping or managing your Housekeeping? N -  Some recent data might be hidden    Patient Education/ Literacy How often do you need to have someone help you when you read instructions, pamphlets, or other written materials from your doctor or pharmacy?: 1 - Never What is the last grade level you completed in school?: 12th Grade  Exercise Current Exercise Habits: The patient does not participate in regular exercise at present, Exercise limited by: orthopedic condition(s)  Diet Patient reports consuming 1 meals a day and 4 snack(s) a day Patient reports that his primary diet is: Diabetic Patient reports that she does have regular access to  food.   Depression Screen PHQ 2/9 Scores 05/08/2019 05/13/2018 05/07/2018 02/12/2018 11/09/2017 08/07/2017 05/07/2017  PHQ - 2 Score 0 0 '1 2 2 1 1  ' PHQ- 9 Score - - - 6 15 - -     Fall Risk Fall Risk  05/08/2019 05/07/2018 02/12/2018 08/07/2017 05/07/2017  Falls in the past year? 1 1 0 No No  Number falls in past yr: 1 0 - - -  Injury with Fall? 0 0 - - -  Risk for fall due to : History of fall(s) - - - -  Follow up Falls evaluation completed Education provided;Falls prevention discussed - - -     Objective:  Rodney Hernandez seemed alert and oriented and he participated appropriately during our telephone visit.  Blood Pressure Weight BMI  BP Readings from Last 3 Encounters:  02/05/19 (!) 147/74  12/03/18 (!) 124/54  11/05/18 (!) 147/61   Wt Readings from Last 3 Encounters:  05/08/19 175 lb 0.7 oz (79.4 kg)  02/05/19 175 lb (79.4 kg)  12/03/18 181 lb 7 oz (82.3 kg)   BMI Readings from Last 1 Encounters:  05/08/19 27.01 kg/m    *Unable to obtain current vital signs, weight, and BMI due to telephone visit type  Hearing/Vision  . Xue did  seem to have difficulty with hearing/understanding during the telephone conversation . Reports that he has had a formal eye exam by an eye care professional within the past year . Reports that he has not had a formal hearing evaluation within the past year *Unable to fully assess hearing and vision during telephone visit type  Cognitive Function: 6CIT Screen 05/08/2019 05/07/2018  What Year? 0 points 0 points  What month? 0 points 0 points  What time? 0 points 0 points  Count back from 20 0 points 0 points  Months in reverse 0 points 0 points  Repeat phrase 0 points 0 points  Total Score 0 0   (Normal:0-7, Significant for Dysfunction: >8)  Normal Cognitive Function Screening: Yes   Immunization & Health Maintenance Record Immunization History  Administered Date(s) Administered  . Fluad Quad(high Dose 65+) 10/11/2018  . Influenza, High Dose  Seasonal PF 10/20/2013, 10/11/2015,  10/11/2016, 11/09/2017  . Influenza,inj,Quad PF,6+ Mos 10/14/2014  . Pneumococcal Conjugate-13 03/02/2014  . Pneumococcal Polysaccharide-23 11/10/2011  . Td 03/02/2014    Health Maintenance  Topic Date Due  . INFLUENZA VACCINE  08/10/2019  . COLONOSCOPY  09/05/2020  . TETANUS/TDAP  03/02/2024  . Hepatitis C Screening  Completed  . PNA vac Low Risk Adult  Completed  . COVID-19 Vaccine  Discontinued       Assessment  This is a routine wellness examination for Rodney Hernandez.  Health Maintenance: Due or Overdue There are no preventive care reminders to display for this patient.  Rodney Hernandez does not need a referral for Community Assistance: Care Management:   no Social Work:    no Prescription Assistance:  no Nutrition/Diabetes Education:  no   Plan:  Personalized Goals Goals Addressed            This Visit's Progress   . Exercise 3x per week (30 min per time)       Increase walking to 30-45 minutes at least 3 days per week.   05/08/2019 AWV Goal: Exercise for General Health   Patient will verbalize understanding of the benefits of increased physical activity:  Exercising regularly is important. It will improve your overall fitness, flexibility, and endurance.  Regular exercise also will improve your overall health. It can help you control your weight, reduce stress, and improve your bone density.  Over the next year, patient will increase physical activity as tolerated with a goal of at least 150 minutes of moderate physical activity per week.   You can tell that you are exercising at a moderate intensity if your heart starts beating faster and you start breathing faster but can still hold a conversation.  Moderate-intensity exercise ideas include:  Walking 1 mile (1.6 km) in about 15 minutes  Biking  Hiking  Golfing  Dancing  Water aerobics  Patient will verbalize understanding of everyday activities that increase  physical activity by providing examples like the following: ? Yard work, such as: ? Pushing a Conservation officer, nature ? Raking and bagging leaves ? Washing your car ? Pushing a stroller ? Shoveling snow ? Gardening ? Washing windows or floors  Patient will be able to explain general safety guidelines for exercising:   Before you start a new exercise program, talk with your health care provider.  Do not exercise so much that you hurt yourself, feel dizzy, or get very short of breath.  Wear comfortable clothes and wear shoes with good support.  Drink plenty of water while you exercise to prevent dehydration or heat stroke.  Work out until your breathing and your heartbeat get faster.     . Have 3 meals a day       05/08/2019 AWV Goal: Improved Nutrition/Diet  . Patient will verbalize understanding that diet plays an important role in overall health and that a poor diet is a risk factor for many chronic medical conditions.  . Over the next year, patient will improve self management of their diet by incorporating better variety, improved meal pattern, more consistent meal timing, increased physical activity, better food choices, watch portion sizes/amount of food eaten at one time, and eat 6 small meals per day. . Patient will utilize available community resources to help with food acquisition if needed (ex: food pantries, [address], etc) . Patient will work with nutrition specialist if a referral was made       Personalized Health Maintenance & Screening Recommendations  Advanced directives:  has an advanced directive - a copy HAS NOT been provided.  Lung Cancer Screening Recommended: no (Low Dose CT Chest recommended if Age 66-80 years, 30 pack-year currently smoking OR have quit w/in past 15 years) Hepatitis C Screening recommended: no HIV Screening recommended: no  Advanced Directives: Written information was not prepared per patient's request.  Referrals & Orders No orders of the defined  types were placed in this encounter.   Follow-up Plan . Follow-up with Chevis Pretty, FNP as planned . Patient to bring in copy of Advance Directive to be scanned into chart   I have personally reviewed and noted the following in the patient's chart:   . Medical and social history . Use of alcohol, tobacco or illicit drugs  . Current medications and supplements . Functional ability and status . Nutritional status . Physical activity . Advanced directives . List of other physicians . Hospitalizations, surgeries, and ER visits in previous 12 months . Vitals . Screenings to include cognitive, depression, and falls . Referrals and appointments  In addition, I have reviewed and discussed with Rodney Hernandez certain preventive protocols, quality metrics, and best practice recommendations. A written personalized care plan for preventive services as well as general preventive health recommendations is available and can be mailed to the patient at his request.      Wardell Heath, LPN  02/12/5595  AVS Printed and mailed to patient

## 2019-05-19 DIAGNOSIS — M5416 Radiculopathy, lumbar region: Secondary | ICD-10-CM | POA: Diagnosis not present

## 2019-05-19 DIAGNOSIS — M25561 Pain in right knee: Secondary | ICD-10-CM | POA: Diagnosis not present

## 2019-05-19 DIAGNOSIS — M25562 Pain in left knee: Secondary | ICD-10-CM | POA: Diagnosis not present

## 2019-05-19 DIAGNOSIS — L4059 Other psoriatic arthropathy: Secondary | ICD-10-CM | POA: Diagnosis not present

## 2019-05-19 DIAGNOSIS — M2550 Pain in unspecified joint: Secondary | ICD-10-CM | POA: Diagnosis not present

## 2019-05-19 DIAGNOSIS — Z6826 Body mass index (BMI) 26.0-26.9, adult: Secondary | ICD-10-CM | POA: Diagnosis not present

## 2019-05-19 DIAGNOSIS — E663 Overweight: Secondary | ICD-10-CM | POA: Diagnosis not present

## 2019-05-19 DIAGNOSIS — M150 Primary generalized (osteo)arthritis: Secondary | ICD-10-CM | POA: Diagnosis not present

## 2019-05-19 DIAGNOSIS — Z79899 Other long term (current) drug therapy: Secondary | ICD-10-CM | POA: Diagnosis not present

## 2019-05-20 ENCOUNTER — Other Ambulatory Visit: Payer: Self-pay | Admitting: Nurse Practitioner

## 2019-05-20 DIAGNOSIS — I10 Essential (primary) hypertension: Secondary | ICD-10-CM

## 2019-06-02 ENCOUNTER — Telehealth: Payer: Self-pay | Admitting: Nurse Practitioner

## 2019-06-02 DIAGNOSIS — E291 Testicular hypofunction: Secondary | ICD-10-CM

## 2019-06-02 MED ORDER — TESTOSTERONE 40.5 MG/2.5GM (1.62%) TD GEL: 1.0000 "application " | Freq: Every day | TRANSDERMAL | 2 refills | Status: DC

## 2019-06-02 NOTE — Telephone Encounter (Signed)
  Prescription Request  06/02/2019  What is the name of the medication or equipment? Testosterone (ANDROGEL) 40.5 MG/2.5GM (1.62%) GEL  Have you contacted your pharmacy to request a refill? (if applicable) no-pt last ov 04/24/2019 with MMM  Which pharmacy would you like this sent to? [facility] [place] phone [phone]   Patient notified that their request is being sent to the clinical staff for review and that they should receive a response within 2 business days.

## 2019-06-16 ENCOUNTER — Encounter: Payer: Self-pay | Admitting: Nurse Practitioner

## 2019-06-16 ENCOUNTER — Ambulatory Visit (INDEPENDENT_AMBULATORY_CARE_PROVIDER_SITE_OTHER): Payer: Medicare HMO | Admitting: Nurse Practitioner

## 2019-06-16 DIAGNOSIS — J0101 Acute recurrent maxillary sinusitis: Secondary | ICD-10-CM | POA: Diagnosis not present

## 2019-06-16 MED ORDER — AMOXICILLIN-POT CLAVULANATE 875-125 MG PO TABS: 1.0000 | ORAL_TABLET | Freq: Two times a day (BID) | ORAL | 0 refills | Status: DC

## 2019-06-16 NOTE — Progress Notes (Signed)
Virtual Visit via telephone Note Due to COVID-19 pandemic this visit was conducted virtually. This visit type was conducted due to national recommendations for restrictions regarding the COVID-19 Pandemic (e.g. social distancing, sheltering in place) in an effort to limit this patient's exposure and mitigate transmission in our community. All issues noted in this document were discussed and addressed.  A physical exam was not performed with this format.  I connected with Rodney Hernandez on 06/16/19 at 1:05 by telephone and verified that I am speaking with the correct person using two identifiers. Rodney Hernandez is currently located at home and no one is currently with him during visit. The provider, Mary-Margaret Hassell Done, FNP is located in their office at time of visit.  I discussed the limitations, risks, security and privacy concerns of performing an evaluation and management service by telephone and the availability of in person appointments. I also discussed with the patient that there may be a patient responsible charge related to this service. The patient expressed understanding and agreed to proceed.   History and Present Illness:   Chief Complaint: Sinusitis   HPI Patient calls in c/o increasing left sided facial pressure and pain. Started 4 day ago. Nose feels stopped up. No drainage.   Review of Systems  Constitutional: Negative for diaphoresis and weight loss.  Eyes: Negative for blurred vision, double vision and pain.  Respiratory: Negative for shortness of breath.   Cardiovascular: Negative for chest pain, palpitations, orthopnea and leg swelling.  Gastrointestinal: Negative for abdominal pain.  Skin: Negative for rash.  Neurological: Negative for dizziness, sensory change, loss of consciousness, weakness and headaches.  Endo/Heme/Allergies: Negative for polydipsia. Does not bruise/bleed easily.  Psychiatric/Behavioral: Negative for memory loss. The patient does not have insomnia.    All other systems reviewed and are negative.    Observations/Objective: Alert and oriented- answers all questions appropriately No distress    Assessment and Plan: Rodney Hernandez in today with chief complaint of Sinusitis   1. Acute recurrent maxillary sinusitis 1. Take meds as prescribed 2. Use a cool mist humidifier especially during the winter months and when heat has been humid. 3. Use saline nose sprays frequently 4. Saline irrigations of the nose can be very helpful if done frequently.  * 4X daily for 1 week*  * Use of a nettie pot can be helpful with this. Follow directions with this* 5. Drink plenty of fluids 6. Keep thermostat turn down low 7.For any cough or congestion  Use plain Mucinex- regular strength or max strength is fine   * Children- consult with Pharmacist for dosing 8. For fever or aces or pains- take tylenol or ibuprofen appropriate for age and weight.  * for fevers greater than 101 orally you may alternate ibuprofen and tylenol every  3 hours.    - amoxicillin-clavulanate (AUGMENTIN) 875-125 MG tablet; Take 1 tablet by mouth 2 (two) times daily.  Dispense: 14 tablet; Refill: 0     Follow Up Instructions: prn    I discussed the assessment and treatment plan with the patient. The patient was provided an opportunity to ask questions and all were answered. The patient agreed with the plan and demonstrated an understanding of the instructions.   The patient was advised to call back or seek an in-person evaluation if the symptoms worsen or if the condition fails to improve as anticipated.  The above assessment and management plan was discussed with the patient. The patient verbalized understanding of and has agreed to the management plan.  Patient is aware to call the clinic if symptoms persist or worsen. Patient is aware when to return to the clinic for a follow-up visit. Patient educated on when it is appropriate to go to the emergency department.   Time  call ended:  1:19  I provided 14 minutes of non-face-to-face time during this encounter.    Mary-Margaret Hassell Done, FNP

## 2019-06-25 ENCOUNTER — Telehealth: Payer: Self-pay | Admitting: Nurse Practitioner

## 2019-06-25 ENCOUNTER — Other Ambulatory Visit: Payer: Self-pay | Admitting: Nurse Practitioner

## 2019-06-25 DIAGNOSIS — C61 Malignant neoplasm of prostate: Secondary | ICD-10-CM

## 2019-06-25 NOTE — Telephone Encounter (Signed)
Order labs.

## 2019-06-25 NOTE — Telephone Encounter (Signed)
Patient aware and verbalized understanding. °

## 2019-06-25 NOTE — Telephone Encounter (Signed)
Psa order has been put in

## 2019-06-25 NOTE — Progress Notes (Signed)
psa

## 2019-06-26 ENCOUNTER — Other Ambulatory Visit: Payer: Self-pay

## 2019-06-26 ENCOUNTER — Other Ambulatory Visit: Payer: Medicare HMO

## 2019-06-26 DIAGNOSIS — C61 Malignant neoplasm of prostate: Secondary | ICD-10-CM | POA: Diagnosis not present

## 2019-06-27 LAB — PSA, TOTAL AND FREE
PSA, Free Pct: 15.2 %
PSA, Free: 1.94 ng/mL
Prostate Specific Ag, Serum: 12.8 ng/mL — ABNORMAL HIGH (ref 0.0–4.0)

## 2019-07-01 DIAGNOSIS — R972 Elevated prostate specific antigen [PSA]: Secondary | ICD-10-CM | POA: Diagnosis not present

## 2019-07-11 ENCOUNTER — Other Ambulatory Visit: Payer: Self-pay | Admitting: Nurse Practitioner

## 2019-07-11 DIAGNOSIS — F321 Major depressive disorder, single episode, moderate: Secondary | ICD-10-CM

## 2019-07-31 ENCOUNTER — Ambulatory Visit: Payer: Self-pay | Admitting: Nurse Practitioner

## 2019-08-25 ENCOUNTER — Telehealth: Payer: Self-pay | Admitting: Nurse Practitioner

## 2019-08-25 ENCOUNTER — Encounter: Payer: Self-pay | Admitting: Nurse Practitioner

## 2019-08-25 ENCOUNTER — Ambulatory Visit (INDEPENDENT_AMBULATORY_CARE_PROVIDER_SITE_OTHER): Payer: Medicare HMO | Admitting: Nurse Practitioner

## 2019-08-25 DIAGNOSIS — R609 Edema, unspecified: Secondary | ICD-10-CM

## 2019-08-25 DIAGNOSIS — E291 Testicular hypofunction: Secondary | ICD-10-CM

## 2019-08-25 DIAGNOSIS — E785 Hyperlipidemia, unspecified: Secondary | ICD-10-CM

## 2019-08-25 DIAGNOSIS — C61 Malignant neoplasm of prostate: Secondary | ICD-10-CM | POA: Diagnosis not present

## 2019-08-25 DIAGNOSIS — R0609 Other forms of dyspnea: Secondary | ICD-10-CM

## 2019-08-25 DIAGNOSIS — R0600 Dyspnea, unspecified: Secondary | ICD-10-CM | POA: Diagnosis not present

## 2019-08-25 DIAGNOSIS — K219 Gastro-esophageal reflux disease without esophagitis: Secondary | ICD-10-CM | POA: Diagnosis not present

## 2019-08-25 DIAGNOSIS — I10 Essential (primary) hypertension: Secondary | ICD-10-CM | POA: Diagnosis not present

## 2019-08-25 MED ORDER — PANTOPRAZOLE SODIUM 40 MG PO TBEC: 40.0000 mg | DELAYED_RELEASE_TABLET | Freq: Two times a day (BID) | ORAL | 1 refills | Status: DC

## 2019-08-25 MED ORDER — ALBUTEROL SULFATE HFA 108 (90 BASE) MCG/ACT IN AERS: 2.0000 | INHALATION_SPRAY | Freq: Four times a day (QID) | RESPIRATORY_TRACT | 0 refills | Status: DC | PRN

## 2019-08-25 MED ORDER — ATORVASTATIN CALCIUM 40 MG PO TABS: 40.0000 mg | ORAL_TABLET | Freq: Every day | ORAL | 1 refills | Status: DC

## 2019-08-25 MED ORDER — OLMESARTAN MEDOXOMIL 40 MG PO TABS: 40.0000 mg | ORAL_TABLET | Freq: Every day | ORAL | 1 refills | Status: DC

## 2019-08-25 MED ORDER — TESTOSTERONE 40.5 MG/2.5GM (1.62%) TD GEL: 1.0000 "application " | Freq: Every day | TRANSDERMAL | 2 refills | Status: DC

## 2019-08-25 NOTE — Progress Notes (Signed)
Patient ID: Rodney Hernandez, male   DOB: [birth year], 74 y.o.   MRN: [id number]    Virtual Visit via telephone Note Due to COVID-19 pandemic this visit was conducted virtually. This visit type was conducted due to national recommendations for restrictions regarding the COVID-19 Pandemic (e.g. social distancing, sheltering in place) in an effort to limit this patient's exposure and mitigate transmission in our community. All issues noted in this document were discussed and addressed.  A physical exam was not performed with this format.  I connected with Rodney Hernandez on 08/25/19 at 11:20 by telephone and verified that I am speaking with the correct person using two identifiers. Rodney Hernandez is currently located at home and his daughter is currently with him during visit. The provider, Mary-Margaret Hassell Done, FNP is located in their office at time of visit.  I discussed the limitations, risks, security and privacy concerns of performing an evaluation and management service by telephone and the availability of in person appointments. I also discussed with the patient that there may be a patient responsible charge related to this service. The patient expressed understanding and agreed to proceed.   History and Present Illness:   Chief Complaint: Medical Management of Chronic Issues    HPI:  1. Essential hypertension No c/o chest pain or headaches. Does not check blood pressure at home BP Readings from Last 3 Encounters:  02/05/19 (!) 147/74  12/03/18 (!) 124/54  11/05/18 (!) 147/61     2. Gastroesophageal reflux disease without esophagitis Is on protonix dialy. Says he feels blotted allthe time and is very gassy. He doe snot take any gas x or beano. He has not seen gi in several years. He has a history of Hpylori.  3. Peripheral edema Has occasionaly  4. Hyperlipidemia with target LDL less than 100 Doe snot watch diet and does no dedicated exercise, even though he does stay very active Lab  Results  Component Value Date   CHOL 98 (L) 04/25/2019   HDL 39 (L) 04/25/2019   LDLCALC 39 04/25/2019   TRIG 108 04/25/2019   CHOLHDL 2.5 04/25/2019    5. DOE (dyspnea on exertion) Has occasional DOE- no chnages. Uses albuterol maybe 2x a week  6. Prostate cancer [facility]) Saw urology a few weeks ago and is doing well.  7. Hypogonadism in male Is on testosterone daily and denies any fatigue.  8. insomnia Takes trazadone to sleep.    Outpatient Encounter Medications as of 08/25/2019  Medication Sig  . albuterol (VENTOLIN HFA) 108 (90 Base) MCG/ACT inhaler Inhale 2 puffs into the lungs every 6 (six) hours as needed for wheezing or shortness of breath.  Marland Kitchen atorvastatin (LIPITOR) 40 MG tablet Take 1 tablet (40 mg total) by mouth daily.  . blood glucose meter kit and supplies KIT Dispense based on patient and insurance preference. Use up to four times daily as directed. (FOR ICD-9 250.00, 250.01).  . chlorthalidone (HYGROTON) 25 MG tablet Take 0.5 tablets (12.5 mg total) by mouth daily.  Marland Kitchen escitalopram (LEXAPRO) 20 MG tablet TAKE 1 TABLET BY MOUTH EVERY DAY  . glucose blood (ACCU-CHEK GUIDE) test strip Use as instructed  . olmesartan (BENICAR) 40 MG tablet Take 1 tablet (40 mg total) by mouth daily.  . pantoprazole (PROTONIX) 40 MG tablet Take 1 tablet (40 mg total) by mouth 2 (two) times daily.  . Testosterone (ANDROGEL) 40.5 MG/2.5GM (1.62%) GEL Apply 1 application topically daily.  . traMADol (ULTRAM) 50 MG tablet      Past  Surgical History:  Procedure Laterality Date  . collapsed lung    . HERNIA REPAIR    . PROSTATE SURGERY      Family History  Problem Relation Age of Onset  . Lung cancer Father   . Prostate cancer Father   . Stomach cancer Maternal Grandmother   . Prostate cancer Paternal Uncle   . Cervical cancer Daughter   . Psoriasis Daughter   . Arthritis Daughter        psoriatic arthritis    New complaints: None other then GI issues state dabove  Social  history: Lives by hisself and his daughters check on him daily  Controlled substance contract: n/a    Review of Systems  Constitutional: Negative for diaphoresis and weight loss.  Eyes: Negative for blurred vision, double vision and pain.  Respiratory: Negative for shortness of breath.   Cardiovascular: Negative for chest pain, palpitations, orthopnea and leg swelling.  Gastrointestinal: Negative for abdominal pain.  Skin: Negative for rash.  Neurological: Negative for dizziness, sensory change, loss of consciousness, weakness and headaches.  Endo/Heme/Allergies: Negative for polydipsia. Does not bruise/bleed easily.  Psychiatric/Behavioral: Negative for memory loss. The patient does not have insomnia.   All other systems reviewed and are negative.    Observations/Objective: Alert and oriented- answers all questions appropriately No distress    Assessment and Plan: *Rodney Hernandez comes in today with chief complaint of Medical Management of Chronic Issues   Diagnosis and orders addressed:  1. Essential hypertension Low sodium diet - olmesartan (BENICAR) 40 MG tablet; Take 1 tablet (40 mg total) by mouth daily.  Dispense: 90 tablet; Refill: 1  2. Gastroesophageal reflux disease without esophagitis Avoid spicy foods Do not eat 2 hours prior to bedtime - Ambulatory referral to Gastroenterology - pantoprazole (PROTONIX) 40 MG tablet; Take 1 tablet (40 mg total) by mouth 2 (two) times daily.  Dispense: 180 tablet; Refill: 1  3. Peripheral edema Elevate legs when sitting  4. Hyperlipidemia with target LDL less than 100 Low fat diet - atorvastatin (LIPITOR) 40 MG tablet; Take 1 tablet (40 mg total) by mouth daily.  Dispense: 90 tablet; Refill: 1  5. DOE (dyspnea on exertion) If needing albuterol more often- wil need ot put on maintenance meds - albuterol (VENTOLIN HFA) 108 (90 Base) MCG/ACT inhaler; Inhale 2 puffs into the lungs every 6 (six) hours as needed for wheezing or  shortness of breath.  Dispense: 18 g; Refill: 0  6. Prostate cancer [facility]) Keep follow up with urology  7. Hypogonadism in male - Testosterone (ANDROGEL) 40.5 MG/2.5GM (1.62%) GEL; Apply 1 application topically daily.  Dispense: 7.5 g; Refill: 2   Labs pending Health Maintenance reviewed Diet and exercise encouraged  Follow up plan: 3 months      I discussed the assessment and treatment plan with the patient. The patient was provided an opportunity to ask questions and all were answered. The patient agreed with the plan and demonstrated an understanding of the instructions.   The patient was advised to call back or seek an in-person evaluation if the symptoms worsen or if the condition fails to improve as anticipated.  The above assessment and management plan was discussed with the patient. The patient verbalized understanding of and has agreed to the management plan. Patient is aware to call the clinic if symptoms persist or worsen. Patient is aware when to return to the clinic for a follow-up visit. Patient educated on when it is appropriate to go to the emergency department.  Time call ended:  11:40  I provided 20 minutes of non-face-to-face time during this encounter.    Mary-Margaret Hassell Done, FNP

## 2019-08-26 MED ORDER — TESTOSTERONE 40.5 MG/2.5GM (1.62%) TD GEL: 1.0000 "application " | Freq: Every day | TRANSDERMAL | 2 refills | Status: DC

## 2019-08-26 NOTE — Telephone Encounter (Signed)
Testosterone rx sent to [facility]

## 2019-09-02 DIAGNOSIS — L280 Lichen simplex chronicus: Secondary | ICD-10-CM | POA: Diagnosis not present

## 2019-09-02 DIAGNOSIS — L570 Actinic keratosis: Secondary | ICD-10-CM | POA: Diagnosis not present

## 2019-09-02 DIAGNOSIS — Z85828 Personal history of other malignant neoplasm of skin: Secondary | ICD-10-CM | POA: Diagnosis not present

## 2019-09-14 ENCOUNTER — Other Ambulatory Visit: Payer: Self-pay | Admitting: Nurse Practitioner

## 2019-09-14 DIAGNOSIS — R0609 Other forms of dyspnea: Secondary | ICD-10-CM

## 2019-09-18 DIAGNOSIS — Z6826 Body mass index (BMI) 26.0-26.9, adult: Secondary | ICD-10-CM | POA: Diagnosis not present

## 2019-09-18 DIAGNOSIS — M150 Primary generalized (osteo)arthritis: Secondary | ICD-10-CM | POA: Diagnosis not present

## 2019-09-18 DIAGNOSIS — Z79899 Other long term (current) drug therapy: Secondary | ICD-10-CM | POA: Diagnosis not present

## 2019-09-18 DIAGNOSIS — M5416 Radiculopathy, lumbar region: Secondary | ICD-10-CM | POA: Diagnosis not present

## 2019-09-18 DIAGNOSIS — M25561 Pain in right knee: Secondary | ICD-10-CM | POA: Diagnosis not present

## 2019-09-18 DIAGNOSIS — M2550 Pain in unspecified joint: Secondary | ICD-10-CM | POA: Diagnosis not present

## 2019-09-18 DIAGNOSIS — L4059 Other psoriatic arthropathy: Secondary | ICD-10-CM | POA: Diagnosis not present

## 2019-09-18 DIAGNOSIS — E663 Overweight: Secondary | ICD-10-CM | POA: Diagnosis not present

## 2019-09-18 DIAGNOSIS — M25562 Pain in left knee: Secondary | ICD-10-CM | POA: Diagnosis not present

## 2019-09-22 ENCOUNTER — Telehealth: Payer: Self-pay | Admitting: Nurse Practitioner

## 2019-09-22 MED ORDER — SUCRALFATE 1 G PO TABS: 1.0000 g | ORAL_TABLET | Freq: Three times a day (TID) | ORAL | 5 refills | Status: DC

## 2019-09-22 NOTE — Telephone Encounter (Signed)
carafate rx sent to pharmacy

## 2019-09-22 NOTE — Telephone Encounter (Signed)
  Prescription Request  09/22/2019  What is the name of the medication or equipment? sucralfate (CARAFATE) 1 G tablet, pt started taking it again because it does help with reflux and would like refill   Have you contacted your pharmacy to request a refill? (if applicable) yes  Which pharmacy would you like this sent to? [facility]   Patient notified that their request is being sent to the clinical staff for review and that they should receive a response within 2 business days.

## 2019-10-24 ENCOUNTER — Other Ambulatory Visit: Payer: Self-pay | Admitting: Nurse Practitioner

## 2019-10-24 DIAGNOSIS — F321 Major depressive disorder, single episode, moderate: Secondary | ICD-10-CM

## 2019-11-06 ENCOUNTER — Ambulatory Visit (INDEPENDENT_AMBULATORY_CARE_PROVIDER_SITE_OTHER): Payer: Medicare HMO

## 2019-11-06 ENCOUNTER — Other Ambulatory Visit: Payer: Self-pay

## 2019-11-06 DIAGNOSIS — Z23 Encounter for immunization: Secondary | ICD-10-CM | POA: Diagnosis not present

## 2019-11-07 ENCOUNTER — Other Ambulatory Visit: Payer: Self-pay | Admitting: Cardiology

## 2019-11-24 LAB — NM MYOCAR MULTI W/SPECT W/WALL MOTION / EF
Estimated workload: 1 METS
Exercise duration (min): 0 min
Exercise duration (sec): 0 s
LV dias vol: 102 mL (ref 62–150)
LV sys vol: 41 mL
MPHR: 145 {beats}/min
Peak HR: 99 {beats}/min
Percent HR: 68 %
RPE: 0
Rest HR: 65 {beats}/min
TID: 1.05

## 2019-12-22 ENCOUNTER — Other Ambulatory Visit: Payer: Self-pay | Admitting: Nurse Practitioner

## 2019-12-22 DIAGNOSIS — E785 Hyperlipidemia, unspecified: Secondary | ICD-10-CM

## 2019-12-23 ENCOUNTER — Other Ambulatory Visit: Payer: Self-pay | Admitting: Nurse Practitioner

## 2019-12-23 DIAGNOSIS — F321 Major depressive disorder, single episode, moderate: Secondary | ICD-10-CM

## 2019-12-29 ENCOUNTER — Telehealth: Payer: Self-pay | Admitting: Nurse Practitioner

## 2019-12-29 NOTE — Telephone Encounter (Signed)
Pt tested positive for COVID on Friday 12/17 and says that he is still feeling bad. Would like an antibiotic and stated that he is unable to take an infusion because of being immune compromised

## 2019-12-29 NOTE — Telephone Encounter (Signed)
Attempted to contact - NA  NTBS per policy if he wants abx Schedule appointment

## 2019-12-30 ENCOUNTER — Encounter: Payer: Self-pay | Admitting: Family

## 2019-12-30 ENCOUNTER — Ambulatory Visit (INDEPENDENT_AMBULATORY_CARE_PROVIDER_SITE_OTHER): Payer: Medicare HMO | Admitting: Family

## 2019-12-30 DIAGNOSIS — U071 COVID-19: Secondary | ICD-10-CM | POA: Diagnosis not present

## 2019-12-30 DIAGNOSIS — R0602 Shortness of breath: Secondary | ICD-10-CM | POA: Diagnosis not present

## 2019-12-30 DIAGNOSIS — J4520 Mild intermittent asthma, uncomplicated: Secondary | ICD-10-CM

## 2019-12-30 MED ORDER — AZITHROMYCIN 250 MG PO TABS: ORAL_TABLET | ORAL | 0 refills | Status: DC

## 2019-12-30 MED ORDER — DEXAMETHASONE 6 MG PO TABS: 6.0000 mg | ORAL_TABLET | Freq: Two times a day (BID) | ORAL | 0 refills | Status: DC

## 2019-12-30 NOTE — Progress Notes (Signed)
Virtual Visit via telephone Note Due to COVID-19 pandemic this visit was conducted virtually. This visit type was conducted due to national recommendations for restrictions regarding the COVID-19 Pandemic (e.g. social distancing, sheltering in place) in an effort to limit this patient's exposure and mitigate transmission in our community. All issues noted in this document were discussed and addressed.  A physical exam was not performed with this format.  I connected with Rodney Hernandez on 12/30/19 at 12:55 pm  by telephone and verified that I am speaking with the correct person using two identifiers. Rodney Hernandez is currently located at home and no one is currently with him  during visit. The provider, Evelina Dun, FNP is located in their office at time of visit.  I discussed the limitations, risks, security and privacy concerns of performing an evaluation and management service by telephone and the availability of in person appointments. I also discussed with the patient that there may be a patient responsible charge related to this service. The patient expressed understanding and agreed to proceed.   History and Present Illness:  Pt calls the office today with cough. He was diagnosed with COVID on 12/26/19.  Cough This is a new problem. The current episode started in the past 7 days. The problem has been gradually worsening. The problem occurs every few minutes. The cough is non-productive. Associated symptoms include a fever, headaches, myalgias, nasal congestion, postnasal drip and shortness of breath. Pertinent negatives include no ear congestion, ear pain, sore throat or wheezing. Associated symptoms comments: Fatigue .     Review of Systems  Constitutional: Positive for fever.  HENT: Positive for postnasal drip. Negative for ear pain and sore throat.   Respiratory: Positive for cough and shortness of breath. Negative for wheezing.   Musculoskeletal: Positive for myalgias.   Neurological: Positive for headaches.  All other systems reviewed and are negative.    Observations/Objective: Mild SOB noted, intermittent cough  Assessment and Plan: 1. COVID-19 virus detected - MyChart COVID-19 home monitoring program; Future - dexamethasone (DECADRON) 6 MG tablet; Take 1 tablet (6 mg total) by mouth 2 (two) times daily.  Dispense: 14 tablet; Refill: 0  2. SOB (shortness of breath) - MyChart COVID-19 home monitoring program; Future - dexamethasone (DECADRON) 6 MG tablet; Take 1 tablet (6 mg total) by mouth 2 (two) times daily.  Dispense: 14 tablet; Refill: 0  3. Mild intermittent chronic asthma without complication - MyChart [id number] home monitoring program; Future - dexamethasone (DECADRON) 6 MG tablet; Take 1 tablet (6 mg total) by mouth 2 (two) times daily.  Dispense: 14 tablet; Refill: 0  Referral placed for MAB infusion Start Dexamethasone  Red flags discussed to go to ED Call if symptoms worsen or do not improve    I discussed the assessment and treatment plan with the patient. The patient was provided an opportunity to ask questions and all were answered. The patient agreed with the plan and demonstrated an understanding of the instructions.   The patient was advised to call back or seek an in-person evaluation if the symptoms worsen or if the condition fails to improve as anticipated.  The above assessment and management plan was discussed with the patient. The patient verbalized understanding of and has agreed to the management plan. Patient is aware to call the clinic if symptoms persist or worsen. Patient is aware when to return to the clinic for a follow-up visit. Patient educated on when it is appropriate to go to the emergency department.  Time call ended:  1:10 pm   I provided 15 minutes of non-face-to-face time during this encounter.    Evelina Dun, FNP

## 2020-01-01 NOTE — Telephone Encounter (Signed)
Pt had appt with Evelina Dun 12/30/19, will close encounter.

## 2020-01-13 DIAGNOSIS — R29707 NIHSS score 7: Secondary | ICD-10-CM | POA: Diagnosis not present

## 2020-01-13 DIAGNOSIS — R739 Hyperglycemia, unspecified: Secondary | ICD-10-CM | POA: Diagnosis not present

## 2020-01-13 DIAGNOSIS — I63232 Cerebral infarction due to unspecified occlusion or stenosis of left carotid arteries: Secondary | ICD-10-CM | POA: Diagnosis not present

## 2020-01-13 DIAGNOSIS — I6522 Occlusion and stenosis of left carotid artery: Secondary | ICD-10-CM | POA: Diagnosis not present

## 2020-01-13 DIAGNOSIS — I672 Cerebral atherosclerosis: Secondary | ICD-10-CM | POA: Diagnosis not present

## 2020-01-13 DIAGNOSIS — E1149 Type 2 diabetes mellitus with other diabetic neurological complication: Secondary | ICD-10-CM | POA: Diagnosis not present

## 2020-01-13 DIAGNOSIS — Z9282 Status post administration of tPA (rtPA) in a different facility within the last 24 hours prior to admission to current facility: Secondary | ICD-10-CM | POA: Diagnosis not present

## 2020-01-13 DIAGNOSIS — F32A Depression, unspecified: Secondary | ICD-10-CM | POA: Diagnosis not present

## 2020-01-13 DIAGNOSIS — I63512 Cerebral infarction due to unspecified occlusion or stenosis of left middle cerebral artery: Secondary | ICD-10-CM | POA: Diagnosis not present

## 2020-01-13 DIAGNOSIS — I639 Cerebral infarction, unspecified: Secondary | ICD-10-CM | POA: Diagnosis not present

## 2020-01-13 DIAGNOSIS — E1165 Type 2 diabetes mellitus with hyperglycemia: Secondary | ICD-10-CM | POA: Diagnosis not present

## 2020-01-13 DIAGNOSIS — I10 Essential (primary) hypertension: Secondary | ICD-10-CM | POA: Diagnosis not present

## 2020-01-13 DIAGNOSIS — R29810 Facial weakness: Secondary | ICD-10-CM | POA: Diagnosis not present

## 2020-01-13 DIAGNOSIS — N400 Enlarged prostate without lower urinary tract symptoms: Secondary | ICD-10-CM | POA: Diagnosis not present

## 2020-01-13 DIAGNOSIS — M1990 Unspecified osteoarthritis, unspecified site: Secondary | ICD-10-CM | POA: Diagnosis not present

## 2020-01-13 DIAGNOSIS — K219 Gastro-esophageal reflux disease without esophagitis: Secondary | ICD-10-CM | POA: Diagnosis not present

## 2020-01-13 DIAGNOSIS — I6503 Occlusion and stenosis of bilateral vertebral arteries: Secondary | ICD-10-CM | POA: Diagnosis not present

## 2020-01-13 DIAGNOSIS — R404 Transient alteration of awareness: Secondary | ICD-10-CM | POA: Diagnosis not present

## 2020-01-13 DIAGNOSIS — E785 Hyperlipidemia, unspecified: Secondary | ICD-10-CM | POA: Diagnosis not present

## 2020-01-13 DIAGNOSIS — I6501 Occlusion and stenosis of right vertebral artery: Secondary | ICD-10-CM | POA: Diagnosis not present

## 2020-01-13 DIAGNOSIS — R059 Cough, unspecified: Secondary | ICD-10-CM | POA: Diagnosis not present

## 2020-01-13 DIAGNOSIS — I517 Cardiomegaly: Secondary | ICD-10-CM | POA: Diagnosis not present

## 2020-01-13 DIAGNOSIS — U071 COVID-19: Secondary | ICD-10-CM | POA: Diagnosis not present

## 2020-01-13 DIAGNOSIS — Z743 Need for continuous supervision: Secondary | ICD-10-CM | POA: Diagnosis not present

## 2020-01-13 DIAGNOSIS — Z87891 Personal history of nicotine dependence: Secondary | ICD-10-CM | POA: Diagnosis not present

## 2020-01-13 DIAGNOSIS — R4701 Aphasia: Secondary | ICD-10-CM | POA: Diagnosis not present

## 2020-01-13 DIAGNOSIS — R1310 Dysphagia, unspecified: Secondary | ICD-10-CM | POA: Diagnosis not present

## 2020-01-13 DIAGNOSIS — I63412 Cerebral infarction due to embolism of left middle cerebral artery: Secondary | ICD-10-CM | POA: Diagnosis not present

## 2020-01-13 DIAGNOSIS — Z8673 Personal history of transient ischemic attack (TIA), and cerebral infarction without residual deficits: Secondary | ICD-10-CM | POA: Diagnosis not present

## 2020-01-13 DIAGNOSIS — I6789 Other cerebrovascular disease: Secondary | ICD-10-CM | POA: Diagnosis not present

## 2020-01-13 DIAGNOSIS — E119 Type 2 diabetes mellitus without complications: Secondary | ICD-10-CM | POA: Diagnosis not present

## 2020-01-13 DIAGNOSIS — R29818 Other symptoms and signs involving the nervous system: Secondary | ICD-10-CM | POA: Diagnosis not present

## 2020-01-13 DIAGNOSIS — J45909 Unspecified asthma, uncomplicated: Secondary | ICD-10-CM | POA: Diagnosis not present

## 2020-01-13 DIAGNOSIS — E871 Hypo-osmolality and hyponatremia: Secondary | ICD-10-CM | POA: Diagnosis not present

## 2020-01-13 DIAGNOSIS — I6523 Occlusion and stenosis of bilateral carotid arteries: Secondary | ICD-10-CM | POA: Diagnosis not present

## 2020-01-13 DIAGNOSIS — R9431 Abnormal electrocardiogram [ECG] [EKG]: Secondary | ICD-10-CM | POA: Diagnosis not present

## 2020-01-14 DIAGNOSIS — F32A Depression, unspecified: Secondary | ICD-10-CM | POA: Diagnosis not present

## 2020-01-14 DIAGNOSIS — E119 Type 2 diabetes mellitus without complications: Secondary | ICD-10-CM | POA: Diagnosis not present

## 2020-01-14 DIAGNOSIS — E1149 Type 2 diabetes mellitus with other diabetic neurological complication: Secondary | ICD-10-CM | POA: Diagnosis not present

## 2020-01-14 DIAGNOSIS — I6522 Occlusion and stenosis of left carotid artery: Secondary | ICD-10-CM | POA: Diagnosis not present

## 2020-01-14 DIAGNOSIS — I517 Cardiomegaly: Secondary | ICD-10-CM | POA: Diagnosis not present

## 2020-01-14 DIAGNOSIS — N400 Enlarged prostate without lower urinary tract symptoms: Secondary | ICD-10-CM | POA: Diagnosis not present

## 2020-01-14 DIAGNOSIS — E785 Hyperlipidemia, unspecified: Secondary | ICD-10-CM | POA: Diagnosis not present

## 2020-01-14 DIAGNOSIS — K219 Gastro-esophageal reflux disease without esophagitis: Secondary | ICD-10-CM | POA: Diagnosis not present

## 2020-01-14 DIAGNOSIS — I10 Essential (primary) hypertension: Secondary | ICD-10-CM | POA: Diagnosis not present

## 2020-01-14 DIAGNOSIS — R739 Hyperglycemia, unspecified: Secondary | ICD-10-CM | POA: Diagnosis not present

## 2020-01-14 DIAGNOSIS — I63512 Cerebral infarction due to unspecified occlusion or stenosis of left middle cerebral artery: Secondary | ICD-10-CM | POA: Diagnosis not present

## 2020-01-15 DIAGNOSIS — E1149 Type 2 diabetes mellitus with other diabetic neurological complication: Secondary | ICD-10-CM | POA: Diagnosis not present

## 2020-01-15 DIAGNOSIS — I10 Essential (primary) hypertension: Secondary | ICD-10-CM | POA: Diagnosis not present

## 2020-01-15 DIAGNOSIS — I6522 Occlusion and stenosis of left carotid artery: Secondary | ICD-10-CM | POA: Diagnosis not present

## 2020-01-15 DIAGNOSIS — E785 Hyperlipidemia, unspecified: Secondary | ICD-10-CM | POA: Diagnosis not present

## 2020-01-15 DIAGNOSIS — I63512 Cerebral infarction due to unspecified occlusion or stenosis of left middle cerebral artery: Secondary | ICD-10-CM | POA: Diagnosis not present

## 2020-01-15 DIAGNOSIS — I639 Cerebral infarction, unspecified: Secondary | ICD-10-CM | POA: Diagnosis not present

## 2020-01-16 DIAGNOSIS — I639 Cerebral infarction, unspecified: Secondary | ICD-10-CM | POA: Diagnosis not present

## 2020-01-16 DIAGNOSIS — I6522 Occlusion and stenosis of left carotid artery: Secondary | ICD-10-CM | POA: Diagnosis not present

## 2020-01-17 DIAGNOSIS — I639 Cerebral infarction, unspecified: Secondary | ICD-10-CM | POA: Diagnosis not present

## 2020-01-17 DIAGNOSIS — I6522 Occlusion and stenosis of left carotid artery: Secondary | ICD-10-CM | POA: Diagnosis not present

## 2020-01-18 DIAGNOSIS — I639 Cerebral infarction, unspecified: Secondary | ICD-10-CM | POA: Diagnosis not present

## 2020-01-18 DIAGNOSIS — R739 Hyperglycemia, unspecified: Secondary | ICD-10-CM | POA: Diagnosis not present

## 2020-01-18 DIAGNOSIS — I6522 Occlusion and stenosis of left carotid artery: Secondary | ICD-10-CM | POA: Diagnosis not present

## 2020-01-18 DIAGNOSIS — E119 Type 2 diabetes mellitus without complications: Secondary | ICD-10-CM | POA: Diagnosis not present

## 2020-01-19 DIAGNOSIS — I639 Cerebral infarction, unspecified: Secondary | ICD-10-CM | POA: Diagnosis not present

## 2020-01-19 DIAGNOSIS — R739 Hyperglycemia, unspecified: Secondary | ICD-10-CM | POA: Diagnosis not present

## 2020-01-19 DIAGNOSIS — E119 Type 2 diabetes mellitus without complications: Secondary | ICD-10-CM | POA: Diagnosis not present

## 2020-01-20 DIAGNOSIS — E119 Type 2 diabetes mellitus without complications: Secondary | ICD-10-CM | POA: Diagnosis not present

## 2020-01-20 DIAGNOSIS — R739 Hyperglycemia, unspecified: Secondary | ICD-10-CM | POA: Diagnosis not present

## 2020-01-20 DIAGNOSIS — I639 Cerebral infarction, unspecified: Secondary | ICD-10-CM | POA: Diagnosis not present

## 2020-01-22 DIAGNOSIS — I10 Essential (primary) hypertension: Secondary | ICD-10-CM | POA: Diagnosis not present

## 2020-01-22 DIAGNOSIS — I63512 Cerebral infarction due to unspecified occlusion or stenosis of left middle cerebral artery: Secondary | ICD-10-CM | POA: Diagnosis not present

## 2020-01-22 DIAGNOSIS — Z8673 Personal history of transient ischemic attack (TIA), and cerebral infarction without residual deficits: Secondary | ICD-10-CM | POA: Diagnosis not present

## 2020-01-22 DIAGNOSIS — E785 Hyperlipidemia, unspecified: Secondary | ICD-10-CM | POA: Diagnosis not present

## 2020-01-22 DIAGNOSIS — E1140 Type 2 diabetes mellitus with diabetic neuropathy, unspecified: Secondary | ICD-10-CM | POA: Diagnosis not present

## 2020-01-22 DIAGNOSIS — R2689 Other abnormalities of gait and mobility: Secondary | ICD-10-CM | POA: Diagnosis not present

## 2020-01-22 DIAGNOSIS — E1149 Type 2 diabetes mellitus with other diabetic neurological complication: Secondary | ICD-10-CM | POA: Diagnosis not present

## 2020-01-22 DIAGNOSIS — E119 Type 2 diabetes mellitus without complications: Secondary | ICD-10-CM | POA: Diagnosis not present

## 2020-01-22 DIAGNOSIS — I63239 Cerebral infarction due to unspecified occlusion or stenosis of unspecified carotid arteries: Secondary | ICD-10-CM | POA: Insufficient documentation

## 2020-01-22 DIAGNOSIS — I639 Cerebral infarction, unspecified: Secondary | ICD-10-CM | POA: Diagnosis not present

## 2020-01-22 DIAGNOSIS — E1165 Type 2 diabetes mellitus with hyperglycemia: Secondary | ICD-10-CM | POA: Diagnosis not present

## 2020-01-22 DIAGNOSIS — D649 Anemia, unspecified: Secondary | ICD-10-CM | POA: Diagnosis not present

## 2020-01-22 DIAGNOSIS — I69118 Other symptoms and signs involving cognitive functions following nontraumatic intracerebral hemorrhage: Secondary | ICD-10-CM | POA: Diagnosis not present

## 2020-01-22 DIAGNOSIS — R1310 Dysphagia, unspecified: Secondary | ICD-10-CM | POA: Diagnosis not present

## 2020-01-22 DIAGNOSIS — I69120 Aphasia following nontraumatic intracerebral hemorrhage: Secondary | ICD-10-CM | POA: Diagnosis not present

## 2020-01-22 DIAGNOSIS — I69191 Dysphagia following nontraumatic intracerebral hemorrhage: Secondary | ICD-10-CM | POA: Diagnosis not present

## 2020-01-22 DIAGNOSIS — I6522 Occlusion and stenosis of left carotid artery: Secondary | ICD-10-CM | POA: Diagnosis not present

## 2020-01-22 DIAGNOSIS — I69198 Other sequelae of nontraumatic intracerebral hemorrhage: Secondary | ICD-10-CM | POA: Diagnosis not present

## 2020-01-24 ENCOUNTER — Other Ambulatory Visit: Payer: Self-pay | Admitting: Nurse Practitioner

## 2020-01-28 ENCOUNTER — Telehealth: Payer: Self-pay

## 2020-01-28 NOTE — Telephone Encounter (Signed)
Appt made.  Danae Chen with [facility] aware

## 2020-02-04 ENCOUNTER — Encounter: Payer: Self-pay | Admitting: Family Medicine

## 2020-02-04 ENCOUNTER — Other Ambulatory Visit: Payer: Self-pay

## 2020-02-04 ENCOUNTER — Ambulatory Visit (INDEPENDENT_AMBULATORY_CARE_PROVIDER_SITE_OTHER): Payer: Medicare HMO | Admitting: Family Medicine

## 2020-02-04 VITALS — BP 97/57 | HR 80 | Temp 98.3°F | Resp 20 | Ht 67.5 in | Wt 175.0 lb

## 2020-02-04 DIAGNOSIS — E559 Vitamin D deficiency, unspecified: Secondary | ICD-10-CM

## 2020-02-04 DIAGNOSIS — Z09 Encounter for follow-up examination after completed treatment for conditions other than malignant neoplasm: Secondary | ICD-10-CM | POA: Diagnosis not present

## 2020-02-04 DIAGNOSIS — I6930 Unspecified sequelae of cerebral infarction: Secondary | ICD-10-CM

## 2020-02-04 DIAGNOSIS — E119 Type 2 diabetes mellitus without complications: Secondary | ICD-10-CM | POA: Diagnosis not present

## 2020-02-04 DIAGNOSIS — D649 Anemia, unspecified: Secondary | ICD-10-CM | POA: Insufficient documentation

## 2020-02-04 DIAGNOSIS — I63512 Cerebral infarction due to unspecified occlusion or stenosis of left middle cerebral artery: Secondary | ICD-10-CM

## 2020-02-04 DIAGNOSIS — D509 Iron deficiency anemia, unspecified: Secondary | ICD-10-CM

## 2020-02-04 DIAGNOSIS — R6889 Other general symptoms and signs: Secondary | ICD-10-CM | POA: Diagnosis not present

## 2020-02-04 DIAGNOSIS — Z9889 Other specified postprocedural states: Secondary | ICD-10-CM | POA: Insufficient documentation

## 2020-02-04 DIAGNOSIS — I639 Cerebral infarction, unspecified: Secondary | ICD-10-CM

## 2020-02-04 LAB — BAYER DCA HB A1C WAIVED: HB A1C (BAYER DCA - WAIVED): 8.3 % — ABNORMAL HIGH (ref <7.0)

## 2020-02-04 MED ORDER — CLOPIDOGREL BISULFATE 75 MG PO TABS
75.0000 mg | ORAL_TABLET | Freq: Every day | ORAL | 3 refills | Status: DC
Start: 2020-02-04 — End: 2020-08-09

## 2020-02-04 NOTE — Progress Notes (Signed)
Established Patient Office Visit  Subjective:  Patient ID: Rodney Hernandez, male    DOB: 1945-08-28  Age: 75 y.o. MRN: [id number]  CC:  Chief Complaint  Patient presents with  . Hospitalization Follow-up    [facility]  01/13/20-01/22/20.     HPI Rodney Hernandez presents for hospital follow up. He is her with his daughter today. He was taken to [facility] and then transferred to [facility] by helicopter on 07/17/27 for 2 strokes. He also had a left carotid endarterectomy. He was discharged on 01/22/20 to a rehab facility. He stayed there for one week until he was discharged home. His daughter has been staying with him. He reports that most of his symptoms are resolved. He is using a cane with ambulation and feels like he "doesn't have the get up and go" that he used to. He is scheduled to see the neurologist on 02/11/20. Home PT and OT has been ordered. They have received a call regarding this from [facility] but nothing has been scheduled yet. He has been doing some home exercises in the meantime.   He was on insulin while in the hospital. However this was discontinued at the rehab facility. He is not currently taking diabetes medication and was not on insulin prior to hospital admission. He has been following a strict diet and has been checking his blood sugars. His fasting blood sugars have been 80-100.   Past Medical History:  Diagnosis Date  . Arthritis    psoriatic arthritis and osteo arthritis  . Asthma   . Collapse of lung   . Colon polyps   . DOE (dyspnea on exertion) 12/02/2018  . Eczema   . GERD (gastroesophageal reflux disease)   . Hypertension   . Seasonal allergies   . Stroke ([place])   . Vertigo     Past Surgical History:  Procedure Laterality Date  . collapsed lung    . HERNIA REPAIR    . PROSTATE SURGERY      Family History  Problem Relation Age of Onset  . Lung cancer Father   . Prostate cancer Father   . Stomach cancer Maternal Grandmother   . Prostate cancer  Paternal Uncle   . Cervical cancer Daughter   . Psoriasis Daughter   . Arthritis Daughter        psoriatic arthritis    Social History   Socioeconomic History  . Marital status: Widowed    Spouse name: Not on file  . Number of children: 2  . Years of education: 50  . Highest education level: High school graduate  Occupational History  . Occupation: Retired    Comment: worked in a Writer for 33 years  Tobacco Use  . Smoking status: Former Smoker    Packs/day: 2.00    Years: 22.00    Pack years: 44.00    Types: Cigarettes    Quit date: 01/09/1981    Years since quitting: 39.0  . Smokeless tobacco: Never Used  Vaping Use  . Vaping Use: Never used  Substance and Sexual Activity  . Alcohol use: No  . Drug use: No  . Sexual activity: Not Currently  Other Topics Concern  . Not on file  Social History Narrative   Widowed, 2 children   Right handed   12th grade   3 cups daily   Social Determinants of Health   Financial Resource Strain: Low Risk   . Difficulty of Paying Living Expenses: Not hard at all  Food Insecurity: No Food Insecurity  . Worried About Charity fundraiser in the Last Year: Never true  . Ran Out of Food in the Last Year: Never true  Transportation Needs: No Transportation Needs  . Lack of Transportation (Medical): No  . Lack of Transportation (Non-Medical): No  Physical Activity: Inactive  . Days of Exercise per Week: 0 days  . Minutes of Exercise per Session: 0 min  Stress: No Stress Concern Present  . Feeling of Stress : Not at all  Social Connections: Socially Isolated  . Frequency of Communication with Friends and Family: Once a week  . Frequency of Social Gatherings with Friends and Family: Once a week  . Attends Religious Services: More than 4 times per year  . Active Member of Clubs or Organizations: No  . Attends Archivist Meetings: Never  . Marital Status: Widowed  Intimate Partner Violence: Not At Risk  . Fear of  Current or Ex-Partner: No  . Emotionally Abused: No  . Physically Abused: No  . Sexually Abused: No    Outpatient Medications Prior to Visit  Medication Sig Dispense Refill  . albuterol (VENTOLIN HFA) 108 (90 Base) MCG/ACT inhaler TAKE 2 PUFFS BY MOUTH EVERY 6 HOURS AS NEEDED FOR WHEEZE OR SHORTNESS OF BREATH 6.7 g 1  . aspirin 81 MG chewable tablet Chew 81 mg by mouth daily.    Marland Kitchen atorvastatin (LIPITOR) 40 MG tablet TAKE 1 TABLET BY MOUTH EVERY DAY 90 tablet 0  . blood glucose meter kit and supplies KIT Dispense based on patient and insurance preference. Use up to four times daily as directed. (FOR ICD-9 250.00, 250.01). 1 each 0  . chlorthalidone (HYGROTON) 25 MG tablet TAKE 1 TABLET BY MOUTH EVERY DAY 90 tablet 3  . cholecalciferol (VITAMIN D3) 25 MCG (1000 UNIT) tablet Take 1,000 Units by mouth daily.    . clopidogrel (PLAVIX) 75 MG tablet Take 75 mg by mouth daily.    Marland Kitchen escitalopram (LEXAPRO) 20 MG tablet TAKE 1 TABLET BY MOUTH EVERY DAY 90 tablet 0  . glucose blood (ACCU-CHEK GUIDE) test strip Use as instructed 100 each 12  . iron polysaccharides (NIFEREX) 150 MG capsule Take 150 mg by mouth daily.    Marland Kitchen olmesartan (BENICAR) 40 MG tablet Take 1 tablet (40 mg total) by mouth daily. 90 tablet 1  . pantoprazole (PROTONIX) 40 MG tablet Take 1 tablet (40 mg total) by mouth 2 (two) times daily. 180 tablet 1  . sucralfate (CARAFATE) 1 g tablet Take 1 tablet (1 g total) by mouth 4 (four) times daily -  with meals and at bedtime. (Needs to be seen before next refill) 90 tablet 0  . Testosterone (ANDROGEL) 40.5 MG/2.5GM (1.62%) GEL Apply 1 application topically daily. (Patient not taking: Reported on 02/04/2020) 7.5 g 2  . azithromycin (ZITHROMAX) 250 MG tablet Take 500 mg once, then 250 mg for four days 6 tablet 0  . dexamethasone (DECADRON) 6 MG tablet Take 1 tablet (6 mg total) by mouth 2 (two) times daily. 14 tablet 0  . traMADol (ULTRAM) 50 MG tablet      No facility-administered medications  prior to visit.    Allergies  Allergen Reactions  . Nsaids Shortness Of Breath  . Aspirin Other (See Comments)    Affects patient breathing  . Fluticasone Rash    Of face  . Morphine And Related Hives and Itching  . Sulfa Antibiotics     ROS Review of Systems Negative unless  specially indicated above in HPI.   Objective:    Physical Exam Vitals and nursing note reviewed.  Constitutional:      General: He is not in acute distress.    Appearance: Normal appearance. He is not ill-appearing, toxic-appearing or diaphoretic.  HENT:     Head: Normocephalic and atraumatic.  Eyes:     Extraocular Movements: Extraocular movements intact.     Conjunctiva/sclera: Conjunctivae normal.     Pupils: Pupils are equal, round, and reactive to light.  Neck:     Comments: Incision with approximated edges noted on left side from recent surgery. No signs of infection.  Cardiovascular:     Rate and Rhythm: Normal rate and regular rhythm.     Heart sounds: Normal heart sounds. No murmur heard.   Pulmonary:     Effort: Pulmonary effort is normal. No respiratory distress.     Breath sounds: Normal breath sounds.  Abdominal:     General: Bowel sounds are normal. There is no distension.     Palpations: Abdomen is soft.     Tenderness: There is no abdominal tenderness. There is no guarding.  Musculoskeletal:     Cervical back: Neck supple. No tenderness.     Right lower leg: No edema.     Left lower leg: No edema.  Skin:    General: Skin is dry.  Neurological:     General: No focal deficit present.     Mental Status: He is alert and oriented to person, place, and time.     Gait: Gait abnormal (using cane).  Psychiatric:        Mood and Affect: Mood normal.        Behavior: Behavior normal.        Thought Content: Thought content normal.        Judgment: Judgment normal.    BP (!) 97/57   Pulse 80   Temp 98.3 F (36.8 C)   Resp 20   Ht 5' 7.5" (1.715 m)   Wt 175 lb (79.4 kg)    SpO2 94%   BMI 27.00 kg/m  Wt Readings from Last 3 Encounters:  02/04/20 175 lb (79.4 kg)  05/08/19 175 lb 0.7 oz (79.4 kg)  02/05/19 175 lb (79.4 kg)     There are no preventive care reminders to display for this patient.  There are no preventive care reminders to display for this patient.  No results found for: TSH Lab Results  Component Value Date   WBC 8.0 04/25/2019   HGB 13.3 04/25/2019   HCT 43.2 04/25/2019   MCV 85 04/25/2019   PLT 316 04/25/2019   Lab Results  Component Value Date   NA 141 04/25/2019   K 4.1 04/25/2019   CO2 28 04/25/2019   GLUCOSE 146 (H) 04/25/2019   BUN 20 04/25/2019   CREATININE 1.07 04/25/2019   BILITOT 0.4 04/25/2019   ALKPHOS 90 04/25/2019   AST 17 04/25/2019   ALT 15 04/25/2019   PROT 6.4 04/25/2019   ALBUMIN 4.0 04/25/2019   CALCIUM 10.9 (H) 04/25/2019   ANIONGAP 7 12/03/2018   Lab Results  Component Value Date   CHOL 98 (L) 04/25/2019   Lab Results  Component Value Date   HDL 39 (L) 04/25/2019   Lab Results  Component Value Date   LDLCALC 39 04/25/2019   Lab Results  Component Value Date   TRIG 108 04/25/2019   Lab Results  Component Value Date   CHOLHDL 2.5 04/25/2019  Lab Results  Component Value Date   HGBA1C 6.6 04/25/2019      Assessment & Plan:   Fredick was seen today for hospitalization follow-up.  Diagnoses and all orders for this visit:  Acute ischemic left MCA stroke (HCC)/Acute embolic stroke [facility]) Keep scheduled appointment with neurology. Home PT by OT has been ordered.  -     clopidogrel (PLAVIX) 75 MG tablet; Take 1 tablet (75 mg total) by mouth daily.  History of left-sided carotid endarterectomy Incision is healing well. Continue plavix.  -     clopidogrel (PLAVIX) 75 MG tablet; Take 1 tablet (75 mg total) by mouth daily.  Iron deficiency anemia, unspecified iron deficiency anemia type Labs pending as below  -     Anemia Profile B  Type 2 diabetes mellitus without complication,  without long-term current use of insulin (HCC) A1c today. Was 11 in hospital, however home blood sugars are well controlled at 80-100.  -     CMP14+EGFR -     Bayer DCA Hb A1c Waived  Vitamin D deficiency Labs pending as below. On repletion therapy.  -     VITAMIN D 25 Hydroxy (Vit-D Deficiency, Fractures)  Hospital discharge follow-up Reviewed hospital records.  -     CMP14+EGFR   Follow-up: Return in about 3 months (around 05/04/2020) for chronic follow up. Sooner if needed.   The patient indicates understanding of these issues and agrees with the plan.  Gwenlyn Perking, FNP

## 2020-02-04 NOTE — Patient Instructions (Signed)
Diabetes Mellitus Action Plan Following a diabetes action plan is a way for you to manage your diabetes (diabetes mellitus) symptoms. The plan is color-coded to help you understand what actions you need to take based on any symptoms you are having. If you have symptoms in the red zone, you need medical care right away. If you have symptoms in the yellow zone, you are having problems. If you have symptoms in the green zone, you are doing well. Learning about and understanding diabetes can take time. Follow the plan that you develop with your health care provider. Know the target range for your blood sugar (glucose) level, and review your treatment plan with your health care provider at each visit. The target range for my blood sugar level is __________________________ mg/dL. Red zone Get medical help right away if you have any of the following symptoms: A blood sugar test result that is below 54 mg/dL (3 mmol/L). A blood sugar test result that is at or above 240 mg/dL (51.8 mmol/L) for 2 days in a row. Confusion or trouble thinking clearly. Difficulty breathing. Sickness or a fever for 2 or more days that is not getting better. Moderate or large ketone levels in your urine. Feeling tired or having no energy. If you have any red zone symptoms, do not wait to see if the symptoms will go away. Get medical help right away. Call your local emergency services (911 in the [place] Do not drive yourself to the hospital. If you have severely low blood sugar (severe hypoglycemia) and you cannot eat or drink, you may need glucagon. Make sure a family member or close friend knows how to check your blood sugar and how to give you glucagon. You may need to be treated in a hospital for this condition.   Yellow zone If you have any of the following symptoms, your diabetes is not under control and you may need to make some changes: A blood sugar test result that is at or above 240 mg/dL (84.1 mmol/L) for 2 days in a  row. Blood sugar test results that are below 70 mg/dL (3.9 mmol/L). Other symptoms of hypoglycemia, such as: Shaking or feeling light-headed. Confusion or irritability. Feeling hungry. Having a fast heartbeat. If you have any yellow zone symptoms: Treat your hypoglycemia by eating or drinking 15 grams of a rapid-acting carbohydrate. Follow the 15:15 rule: Take 15 grams of a rapid-acting carbohydrate, such as: 1 tube of glucose gel. 4 glucose pills. 4 oz (120 mL) of fruit juice. 4 oz (120 mL) of regular (not diet) soda. Check your blood sugar 15 minutes after you take the carbohydrate. If the repeat blood sugar test is still at or below 70 mg/dL (3.9 mmol/L), take 15 grams of a carbohydrate again. If your blood sugar does not increase above 70 mg/dL (3.9 mmol/L) after 3 tries, get medical help right away. After your blood sugar returns to normal, eat a meal or a snack within 1 hour. Keep taking your daily medicines as told by your health care provider. Check your blood sugar more often than you normally would. Write down your results. Call your health care provider if you have trouble keeping your blood sugar in your target range.   Green zone These signs mean you are doing well and you can continue what you are doing to manage your diabetes: Your blood sugar is within your personal target range. For most people, a blood sugar level before a meal (preprandial) should be 80-130 mg/dL (6.6-0.6  mmol/L). You feel well, and you are able to do daily activities. If you are in the green zone, continue to manage your diabetes as told by your health care provider. To do this: Eat a healthy diet. Exercise regularly. Check your blood sugar as told by your health care provider. Take your medicines as told by your health care provider.   Where to find more information American Diabetes Association (ADA): diabetes.org Association of Diabetes Care & Education Specialists (ADCES):  diabeteseducator.org Summary Following a diabetes action plan is a way for you to manage your diabetes symptoms. The plan is color-coded to help you understand what actions you need to take based on any symptoms you are having. Follow the plan that you develop with your health care provider. Make sure you know your personal target blood sugar level. Review your treatment plan with your health care provider at each visit. This information is not intended to replace advice given to you by your health care provider. Make sure you discuss any questions you have with your health care provider. Document Revised: 07/03/2019 Document Reviewed: 07/03/2019 Elsevier Patient Education  2021 [place] After Stroke A stroke causes damage to the brain cells, which can affect your ability to walk, talk, and even eat. The impact of a stroke is different for everyone, and so is recovery. A good nutrition plan is important for your recovery. It can also lower your risk of another stroke. If you have difficulty chewing and swallowing your food, a dietitian or your stroke care team can help so that you can enjoy eating healthy foods. What are tips for following this plan? Reading food labels  Choose foods that have less than 300 milligrams (mg) of sodium per serving. Limit your sodium intake to less than 1,500 mg per day.  Avoid foods that have saturated fat and trans fat.  Choose foods that are low in cholesterol. Limit the amount of cholesterol you eat each day to less than 200 mg.  Choose foods that are high in fiber. Eat 20-30 grams (g) of fiber each day.  Avoid foods with added sugar. Check the food label for ingredients such as sugar, corn syrup, honey, fructose, molasses, and cane juice. Shopping  At the grocery store, buy most of your food from areas near the walls of the store. This includes: ? Fresh fruits and vegetables. ? Dry grains, beans, nuts, and seeds. ? Fresh seafood,  poultry, lean meats, and eggs. ? Low-fat dairy products.  Buy whole ingredients instead of prepackaged foods.  Buy fresh, in-season fruits and vegetables from local farmers markets.  Buy frozen fruits and vegetables in resealable bags. Cooking  Prepare foods with very little salt. Use herbs or salt-free spices instead.  Cook with heart-healthy oils, such as olive, avocado, canola, soybean, or sunflower oil.  Avoid frying foods. Bake, grill, or broil foods instead.  Remove visible fat and skin from meat and poultry before eating.  Modify food textures as told by your health care provider. Meal planning  Eat a wide variety of colorful fruits and vegetables. Make sure one-half of your plate is filled with fruits and vegetables at each meal.  Eat fruits and vegetables that are high in potassium, such as: ? Apples, bananas, oranges, and melon. ? Sweet potatoes, spinach, zucchini, and tomatoes.  Eat fish that contain heart-healthy fats (omega-3 fats) at least twice a week. These include salmon, tuna, mackerel, and sardines.  Eat plant foods that are high in omega-3 fats, such  as flaxseeds and walnuts. Add these to cereals, yogurt, or pasta dishes.  Eat several servings of high-fiber foods each day, such as fruits, vegetables, whole grains, and beans.  Do not put salt at the table for meals.  When eating out at restaurants: ? Ask the server about low-salt or salt-free food options. ? Avoid fried foods. Look for menu items that are grilled, steamed, broiled, or roasted. ? Ask if your food can be prepared without butter. ? Ask for condiments, such as salad dressings, gravy, or sauces to be served on the side.  If you have difficulty swallowing: ? Choose foods that are softer and easier to chew and swallow. ? Cut foods into small pieces and chew well before swallowing. ? Thicken liquids as told by your health care provider or dietitian. ? Let your health care provider know if your  condition does not improve over time. You may need to work with a speech therapist to re-train the muscles that are used for eating. General recommendations  Involve your family and friends in your recovery, if possible. It may be helpful to have a slower meal time and to plan meals that include foods everyone in the family can eat.  Brush your teeth with fluoride toothpaste twice a day, and floss once a day. Keeping a clean mouth can help you swallow and can also help your appetite.  Drink enough water each day to keep your urine pale yellow. If needed, set reminders or ask your family to help you remember to drink water.  Limit alcohol intake to no more than 1 drink a day for nonpregnant women and 2 drinks a day for men. One drink equals 12 oz of beer, 5 oz of wine, or 1 oz of hard liquor.   Summary  Following this eating plan can help in your stroke recovery and can decrease your risk for another stroke.  Let your health care provider know if you have problems with swallowing. You may need to work with a speech therapist. This information is not intended to replace advice given to you by your health care provider. Make sure you discuss any questions you have with your health care provider. Document Revised: 04/18/2018 Document Reviewed: 03/05/2017 Elsevier Patient Education  2021 [employer].

## 2020-02-05 ENCOUNTER — Other Ambulatory Visit: Payer: Self-pay | Admitting: Family Medicine

## 2020-02-05 DIAGNOSIS — D509 Iron deficiency anemia, unspecified: Secondary | ICD-10-CM

## 2020-02-05 DIAGNOSIS — E559 Vitamin D deficiency, unspecified: Secondary | ICD-10-CM

## 2020-02-05 LAB — ANEMIA PROFILE B
Basophils Absolute: 0.2 10*3/uL (ref 0.0–0.2)
Basos: 1 %
EOS (ABSOLUTE): 0.4 10*3/uL (ref 0.0–0.4)
Eos: 4 %
Ferritin: 50 ng/mL (ref 30–400)
Folate: 5.8 ng/mL (ref >3.0)
Hematocrit: 40.6 % (ref 37.5–51.0)
Hemoglobin: 12.8 g/dL — ABNORMAL LOW (ref 13.0–17.7)
Immature Grans (Abs): 0.2 10*3/uL — ABNORMAL HIGH (ref 0.0–0.1)
Immature Granulocytes: 1 %
Iron Saturation: 12 % — ABNORMAL LOW (ref 15–55)
Iron: 42 ug/dL (ref 38–169)
Lymphocytes Absolute: 1.6 10*3/uL (ref 0.7–3.1)
Lymphs: 13 %
MCH: 26.2 pg — ABNORMAL LOW (ref 26.6–33.0)
MCHC: 31.5 g/dL (ref 31.5–35.7)
MCV: 83 fL (ref 79–97)
Monocytes Absolute: 1.3 10*3/uL — ABNORMAL HIGH (ref 0.1–0.9)
Monocytes: 10 %
Neutrophils Absolute: 8.5 10*3/uL — ABNORMAL HIGH (ref 1.4–7.0)
Neutrophils: 71 %
Platelets: 428 10*3/uL (ref 150–450)
RBC: 4.89 x10E6/uL (ref 4.14–5.80)
RDW: 15.2 % (ref 11.6–15.4)
Retic Ct Pct: 3 % — ABNORMAL HIGH (ref 0.6–2.6)
Total Iron Binding Capacity: 352 ug/dL (ref 250–450)
UIBC: 310 ug/dL (ref 111–343)
Vitamin B-12: 395 pg/mL (ref 232–1245)
WBC: 12.1 10*3/uL — ABNORMAL HIGH (ref 3.4–10.8)

## 2020-02-05 LAB — CMP14+EGFR
ALT: 14 IU/L (ref 0–44)
AST: 16 IU/L (ref 0–40)
Albumin/Globulin Ratio: 1.7 (ref 1.2–2.2)
Albumin: 4 g/dL (ref 3.7–4.7)
Alkaline Phosphatase: 109 IU/L (ref 44–121)
BUN/Creatinine Ratio: 19 (ref 10–24)
BUN: 25 mg/dL (ref 8–27)
Bilirubin Total: 0.2 mg/dL (ref 0.0–1.2)
CO2: 27 mmol/L (ref 20–29)
Calcium: 10.6 mg/dL — ABNORMAL HIGH (ref 8.6–10.2)
Chloride: 95 mmol/L — ABNORMAL LOW (ref 96–106)
Creatinine, Ser: 1.33 mg/dL — ABNORMAL HIGH (ref 0.76–1.27)
GFR calc Af Amer: 60 mL/min/{1.73_m2} (ref >59)
GFR calc non Af Amer: 52 mL/min/{1.73_m2} — ABNORMAL LOW (ref >59)
Globulin, Total: 2.3 g/dL (ref 1.5–4.5)
Glucose: 136 mg/dL — ABNORMAL HIGH (ref 65–99)
Potassium: 4.3 mmol/L (ref 3.5–5.2)
Sodium: 133 mmol/L — ABNORMAL LOW (ref 134–144)
Total Protein: 6.3 g/dL (ref 6.0–8.5)

## 2020-02-05 LAB — VITAMIN D 25 HYDROXY (VIT D DEFICIENCY, FRACTURES): Vit D, 25-Hydroxy: 26.2 ng/mL — ABNORMAL LOW (ref 30.0–100.0)

## 2020-02-05 MED ORDER — POLYSACCHARIDE IRON COMPLEX 150 MG PO CAPS: 150.0000 mg | ORAL_CAPSULE | Freq: Every day | ORAL | 0 refills | Status: DC

## 2020-02-05 MED ORDER — VITAMIN D 25 MCG (1000 UNIT) PO TABS
1000.0000 [IU] | ORAL_TABLET | Freq: Every day | ORAL | 3 refills | Status: AC
Start: 2020-02-05 — End: ?

## 2020-02-09 ENCOUNTER — Telehealth: Payer: Self-pay

## 2020-02-14 ENCOUNTER — Other Ambulatory Visit: Payer: Self-pay | Admitting: Nurse Practitioner

## 2020-02-16 ENCOUNTER — Telehealth: Payer: Self-pay

## 2020-02-16 NOTE — Telephone Encounter (Signed)
I spoke to the pt's daughter and she is concerned about his BP dropping a few hours after taking the Benicar and 1/2 tab of chlorthalidone. His BP reading in the morning 150/74 and yesterday a few hours after taking his meds his BP dropped to 86/41 with some dizziness and he almost fell. Does he need to to hold one of the medications? Does he ntbs? Please advise

## 2020-02-16 NOTE — Telephone Encounter (Signed)
Stop chlorthalidone and se what happens

## 2020-02-16 NOTE — Telephone Encounter (Signed)
Daughter aware and verbalizes understanding.  Would to know if he should be taking a half or whole pill of the Benicar? Daughter states that he has been taking a whole pill and the bottle states to take a whole pill but patient thinks he is supposed to be on 1/2 pil. Patient took 1/2 today. Please review and advise

## 2020-02-16 NOTE — Telephone Encounter (Signed)
Stop clhlorthalidone as recommended. Take whole benicar for several days and keep check of blood pressure. If remains below 191 systolic then drop down to 1/2. Is is below 140 but above 100 then continue whole tablet. If is above 478 systolic then we need to discuss.

## 2020-02-17 NOTE — Telephone Encounter (Signed)
Pt's daughter informed and understood. She wrote down the instructions. She will call back if BP goes over 140.

## 2020-02-18 DIAGNOSIS — G4710 Hypersomnia, unspecified: Secondary | ICD-10-CM | POA: Diagnosis not present

## 2020-02-18 DIAGNOSIS — I10 Essential (primary) hypertension: Secondary | ICD-10-CM | POA: Diagnosis not present

## 2020-02-18 DIAGNOSIS — E1140 Type 2 diabetes mellitus with diabetic neuropathy, unspecified: Secondary | ICD-10-CM | POA: Diagnosis not present

## 2020-02-18 DIAGNOSIS — Z6826 Body mass index (BMI) 26.0-26.9, adult: Secondary | ICD-10-CM | POA: Diagnosis not present

## 2020-02-18 DIAGNOSIS — R0683 Snoring: Secondary | ICD-10-CM | POA: Diagnosis not present

## 2020-02-18 DIAGNOSIS — R29818 Other symptoms and signs involving the nervous system: Secondary | ICD-10-CM | POA: Diagnosis not present

## 2020-02-18 DIAGNOSIS — Z8673 Personal history of transient ischemic attack (TIA), and cerebral infarction without residual deficits: Secondary | ICD-10-CM | POA: Diagnosis not present

## 2020-02-23 DIAGNOSIS — M25561 Pain in right knee: Secondary | ICD-10-CM | POA: Diagnosis not present

## 2020-02-23 DIAGNOSIS — M5416 Radiculopathy, lumbar region: Secondary | ICD-10-CM | POA: Diagnosis not present

## 2020-02-23 DIAGNOSIS — L4059 Other psoriatic arthropathy: Secondary | ICD-10-CM | POA: Diagnosis not present

## 2020-02-23 DIAGNOSIS — M25562 Pain in left knee: Secondary | ICD-10-CM | POA: Diagnosis not present

## 2020-02-23 DIAGNOSIS — M2550 Pain in unspecified joint: Secondary | ICD-10-CM | POA: Diagnosis not present

## 2020-02-23 DIAGNOSIS — Z79899 Other long term (current) drug therapy: Secondary | ICD-10-CM | POA: Diagnosis not present

## 2020-02-23 DIAGNOSIS — Z6824 Body mass index (BMI) 24.0-24.9, adult: Secondary | ICD-10-CM | POA: Diagnosis not present

## 2020-02-23 DIAGNOSIS — M150 Primary generalized (osteo)arthritis: Secondary | ICD-10-CM | POA: Diagnosis not present

## 2020-03-03 DIAGNOSIS — L570 Actinic keratosis: Secondary | ICD-10-CM | POA: Diagnosis not present

## 2020-03-04 DIAGNOSIS — Z48812 Encounter for surgical aftercare following surgery on the circulatory system: Secondary | ICD-10-CM | POA: Diagnosis not present

## 2020-03-08 ENCOUNTER — Other Ambulatory Visit: Payer: Self-pay

## 2020-03-08 ENCOUNTER — Encounter: Payer: Self-pay | Admitting: Nurse Practitioner

## 2020-03-08 ENCOUNTER — Ambulatory Visit (INDEPENDENT_AMBULATORY_CARE_PROVIDER_SITE_OTHER): Payer: Medicare HMO | Admitting: Nurse Practitioner

## 2020-03-08 VITALS — BP 126/66 | HR 75 | Temp 98.2°F | Resp 20 | Ht 67.0 in | Wt 179.0 lb

## 2020-03-08 DIAGNOSIS — I10 Essential (primary) hypertension: Secondary | ICD-10-CM | POA: Diagnosis not present

## 2020-03-08 DIAGNOSIS — E119 Type 2 diabetes mellitus without complications: Secondary | ICD-10-CM

## 2020-03-08 LAB — BAYER DCA HB A1C WAIVED: HB A1C (BAYER DCA - WAIVED): 7.5 % — ABNORMAL HIGH (ref <7.0)

## 2020-03-08 MED ORDER — OLMESARTAN MEDOXOMIL 20 MG PO TABS
30.0000 mg | ORAL_TABLET | Freq: Every day | ORAL | 1 refills | Status: DC
Start: 2020-03-08 — End: 2020-03-15

## 2020-03-08 NOTE — Progress Notes (Signed)
   Subjective:    Patient ID: Rodney Hernandez, male    DOB: 1945-11-17, 75 y.o.   MRN: [id number]   Chief Complaint: Discuss diabetes   HPI Patient was in hospital in late December and HGBA1c was 11.0. was on insulin in the hospital. They sent him home on nothing for diabetes. He saw T. Morgan,FNP on 02/04/20 and blood sugars were 80-110 and his hgba1c was 8.3%. since then his blood sugars have been running around 110-140. He  Has been on a strict low carb diet. Blood pressure has been running high in the mornings and he is on benicar 20mg  daily. The 40mg  is to much and bottoms pressure out.   Review of Systems  Constitutional: Negative for diaphoresis.  Eyes: Negative for pain.  Respiratory: Negative for shortness of breath.   Cardiovascular: Negative for chest pain, palpitations and leg swelling.  Gastrointestinal: Negative for abdominal pain.  Endocrine: Negative for polydipsia.  Skin: Negative for rash.  Neurological: Negative for dizziness, weakness and headaches.  Hematological: Does not bruise/bleed easily.  All other systems reviewed and are negative.      Objective:   Physical Exam Vitals and nursing note reviewed.  Constitutional:      Appearance: Normal appearance.  Cardiovascular:     Rate and Rhythm: Normal rate and regular rhythm.     Heart sounds: Normal heart sounds.  Pulmonary:     Breath sounds: Normal breath sounds.  Skin:    General: Skin is warm.  Neurological:     General: No focal deficit present.     Mental Status: He is alert.  Psychiatric:        Mood and Affect: Mood normal.        Behavior: Behavior normal.    BP 126/66   Pulse 75   Temp 98.2 F (36.8 C) (Temporal)   Resp 20   Ht 5\' 7"  (1.702 m)   Wt 179 lb (81.2 kg)   SpO2 95%   BMI 28.04 kg/m   hgba1c 7.5       Assessment & Plan:  Rodney Hernandez in today with chief complaint of Discuss diabetes   1. Diabetes mellitus without complication ([place]) continue to watch carbs in diet -  Bayer DCA Hb A1c Waived  2. Primary hypertension Change benicar to 30mg  daily ( 1 1/2 20mg  tablets ) - olmesartan (BENICAR) 20 MG tablet; Take 1.5 tablets (30 mg total) by mouth daily.  Dispense: 35 tablet; Refill: 1    The above assessment and management plan was discussed with the patient. The patient verbalized understanding of and has agreed to the management plan. Patient is aware to call the clinic if symptoms persist or worsen. Patient is aware when to return to the clinic for a follow-up visit. Patient educated on when it is appropriate to go to the emergency department.   Mary-Margaret Hassell Done, FNP

## 2020-03-08 NOTE — Patient Instructions (Signed)

## 2020-03-09 ENCOUNTER — Other Ambulatory Visit: Payer: Self-pay | Admitting: Nurse Practitioner

## 2020-03-15 ENCOUNTER — Telehealth: Payer: Self-pay

## 2020-03-15 DIAGNOSIS — I10 Essential (primary) hypertension: Secondary | ICD-10-CM

## 2020-03-15 MED ORDER — OLMESARTAN MEDOXOMIL 20 MG PO TABS: 30.0000 mg | ORAL_TABLET | Freq: Every day | ORAL | 1 refills | Status: DC

## 2020-03-15 NOTE — Telephone Encounter (Signed)
Prescription was written for only 35 tablets and should have been for 45.  New prescription for correct amount sent to [facility].

## 2020-03-23 ENCOUNTER — Other Ambulatory Visit: Payer: Self-pay | Admitting: Nurse Practitioner

## 2020-03-23 DIAGNOSIS — K219 Gastro-esophageal reflux disease without esophagitis: Secondary | ICD-10-CM

## 2020-03-26 ENCOUNTER — Telehealth: Payer: Self-pay | Admitting: Nurse Practitioner

## 2020-03-26 DIAGNOSIS — I10 Essential (primary) hypertension: Secondary | ICD-10-CM

## 2020-03-26 NOTE — Telephone Encounter (Signed)
Insurance will not cover 1 AND 1/2 TABLETS DAILY of olmesartan

## 2020-03-30 NOTE — Telephone Encounter (Signed)
Blood pressure has been running a little high. Going to try benicar 40mg  daily and keep diary of blood pressure and see how he does. I will reach out to family in a few days and see if he is tolerating change or if blood pressure has dropped to low.

## 2020-04-09 ENCOUNTER — Other Ambulatory Visit: Payer: Self-pay | Admitting: Family Medicine

## 2020-04-09 DIAGNOSIS — D509 Iron deficiency anemia, unspecified: Secondary | ICD-10-CM

## 2020-04-12 ENCOUNTER — Other Ambulatory Visit: Payer: Self-pay | Admitting: Nurse Practitioner

## 2020-04-12 DIAGNOSIS — K219 Gastro-esophageal reflux disease without esophagitis: Secondary | ICD-10-CM

## 2020-04-15 ENCOUNTER — Telehealth: Payer: Self-pay | Admitting: *Deleted

## 2020-04-15 MED ORDER — OLMESARTAN MEDOXOMIL 40 MG PO TABS: 40.0000 mg | ORAL_TABLET | Freq: Every day | ORAL | 0 refills | Status: DC

## 2020-04-15 NOTE — Addendum Note (Signed)
Addended by: Antonietta Barcelona D on: 04/15/2020 04:49 PM   Modules accepted: Orders

## 2020-04-15 NOTE — Telephone Encounter (Signed)
We have already addressed this he is only taking 1 a day now and keeping a diary of blood pressure at home.

## 2020-04-15 NOTE — Telephone Encounter (Signed)
Per 03/26/20 TC changed to 40 mg daily New Rx sent to pharmacy

## 2020-04-15 NOTE — Telephone Encounter (Signed)
[facility] 20 mg 2 tabs Qd is not covered by insurance Can this be changed to something else Please advise

## 2020-05-02 ENCOUNTER — Other Ambulatory Visit: Payer: Self-pay | Admitting: Nurse Practitioner

## 2020-05-06 ENCOUNTER — Other Ambulatory Visit: Payer: Self-pay

## 2020-05-06 ENCOUNTER — Ambulatory Visit (INDEPENDENT_AMBULATORY_CARE_PROVIDER_SITE_OTHER): Payer: Medicare HMO | Admitting: Nurse Practitioner

## 2020-05-06 ENCOUNTER — Encounter: Payer: Self-pay | Admitting: Nurse Practitioner

## 2020-05-06 VITALS — BP 142/69 | HR 57 | Temp 98.4°F | Resp 20 | Ht 67.0 in | Wt 174.0 lb

## 2020-05-06 DIAGNOSIS — E1151 Type 2 diabetes mellitus with diabetic peripheral angiopathy without gangrene: Secondary | ICD-10-CM | POA: Diagnosis not present

## 2020-05-06 DIAGNOSIS — I6930 Unspecified sequelae of cerebral infarction: Secondary | ICD-10-CM

## 2020-05-06 DIAGNOSIS — E119 Type 2 diabetes mellitus without complications: Secondary | ICD-10-CM | POA: Insufficient documentation

## 2020-05-06 DIAGNOSIS — N401 Enlarged prostate with lower urinary tract symptoms: Secondary | ICD-10-CM | POA: Diagnosis not present

## 2020-05-06 DIAGNOSIS — D509 Iron deficiency anemia, unspecified: Secondary | ICD-10-CM

## 2020-05-06 DIAGNOSIS — I700 Atherosclerosis of aorta: Secondary | ICD-10-CM | POA: Diagnosis not present

## 2020-05-06 DIAGNOSIS — Z8673 Personal history of transient ischemic attack (TIA), and cerebral infarction without residual deficits: Secondary | ICD-10-CM | POA: Insufficient documentation

## 2020-05-06 DIAGNOSIS — K219 Gastro-esophageal reflux disease without esophagitis: Secondary | ICD-10-CM | POA: Diagnosis not present

## 2020-05-06 DIAGNOSIS — L4050 Arthropathic psoriasis, unspecified: Secondary | ICD-10-CM

## 2020-05-06 DIAGNOSIS — I10 Essential (primary) hypertension: Secondary | ICD-10-CM | POA: Diagnosis not present

## 2020-05-06 DIAGNOSIS — F321 Major depressive disorder, single episode, moderate: Secondary | ICD-10-CM

## 2020-05-06 DIAGNOSIS — E291 Testicular hypofunction: Secondary | ICD-10-CM | POA: Diagnosis not present

## 2020-05-06 DIAGNOSIS — E785 Hyperlipidemia, unspecified: Secondary | ICD-10-CM | POA: Diagnosis not present

## 2020-05-06 DIAGNOSIS — R609 Edema, unspecified: Secondary | ICD-10-CM

## 2020-05-06 LAB — BAYER DCA HB A1C WAIVED: HB A1C (BAYER DCA - WAIVED): 6.5 % (ref <7.0)

## 2020-05-06 MED ORDER — POLYSACCHARIDE IRON COMPLEX 150 MG PO CAPS: ORAL_CAPSULE | ORAL | 1 refills | Status: DC

## 2020-05-06 MED ORDER — ESCITALOPRAM OXALATE 20 MG PO TABS: 1.0000 | ORAL_TABLET | Freq: Every day | ORAL | 1 refills | Status: DC

## 2020-05-06 MED ORDER — PANTOPRAZOLE SODIUM 40 MG PO TBEC
1.0000 | DELAYED_RELEASE_TABLET | Freq: Two times a day (BID) | ORAL | 1 refills | Status: DC
Start: 2020-05-06 — End: 2020-08-09

## 2020-05-06 MED ORDER — ATORVASTATIN CALCIUM 40 MG PO TABS: 1.0000 | ORAL_TABLET | Freq: Every day | ORAL | 1 refills | Status: DC

## 2020-05-06 MED ORDER — OLMESARTAN MEDOXOMIL 40 MG PO TABS: 40.0000 mg | ORAL_TABLET | Freq: Every day | ORAL | 0 refills | Status: DC

## 2020-05-06 NOTE — Progress Notes (Signed)
Subjective:    Patient ID: Rodney Hernandez, male    DOB: 1945/07/07, 76 y.o.   MRN: [id number]   Chief Complaint: medical management of chronic issues     HPI:  1. Essential hypertension No c/o chest pain, sob or headache. He is on benicar 28m and we increased it to 617mbut insurance refused to pay for it. Would only approve 1 tablet a day. family has been monitoring bood pressure at home is runnig 14051'Gystolic. BP Readings from Last 3 Encounters:  05/06/20 (!) 142/69  03/08/20 126/66  02/04/20 (!) 97/57     2. Aortic atherosclerosis ([place]  3. Hyperlipidemia with target LDL less than 100 Does try to watch diet and stay active.  Lab Results  Component Value Date   CHOL 98 (L) 04/25/2019   HDL 39 (L) 04/25/2019   LDLCALC 39 04/25/2019   TRIG 108 04/25/2019   CHOLHDL 2.5 04/25/2019     4. Gastroesophageal reflux disease without esophagitis Is on protonix daily and is doing well.  5. Hypogonadism in male Is currently no ton any testosterone  6. Psoriatic arthritis ([place]es rheumatology every 29m62monthLast visit was 02/23/20 with on changes to plan of care.  7. Benign prostatic hyperplasia with lower urinary tract symptoms, symptom details unspecified No problems voiding. He sees urology every 6 months Lab Results  Component Value Date   PSA1 12.8 (H) 06/26/2019   PSA1 11.8 (H) 01/28/2019   PSA1 7.5 (H) 06/18/2018      8. Peripheral edema Has some daily is no longer on fluid pill.  9. Iron deficiency anemia, unspecified iron deficiency anemia type No c/o fatigue. Lab Results  Component Value Date   HGB 12.8 (L) 02/04/2020    10. Diabetes without complications Fasting blood sugars have been below 120 most if the time. He does try to watch diet.  Outpatient Encounter Medications as of 05/06/2020  Medication Sig  . albuterol (VENTOLIN HFA) 108 (90 Base) MCG/ACT inhaler TAKE 2 PUFFS BY MOUTH EVERY 6 HOURS AS NEEDED FOR WHEEZE OR  SHORTNESS OF BREATH  . aspirin 81 MG chewable tablet Chew 81 mg by mouth daily.  . aMarland Kitchenorvastatin (LIPITOR) 40 MG tablet TAKE 1 TABLET BY MOUTH EVERY DAY  . blood glucose meter kit and supplies KIT Dispense based on patient and insurance preference. Use up to four times daily as directed. (FOR ICD-9 250.00, 250.01).  . chlorthalidone (HYGROTON) 25 MG tablet TAKE 1 TABLET BY MOUTH EVERY DAY  . cholecalciferol (VITAMIN D3) 25 MCG (1000 UNIT) tablet Take 1 tablet (1,000 Units total) by mouth daily.  . clopidogrel (PLAVIX) 75 MG tablet Take 1 tablet (75 mg total) by mouth daily.  . eMarland Kitchencitalopram (LEXAPRO) 20 MG tablet TAKE 1 TABLET BY MOUTH EVERY DAY  . glucose blood (ACCU-CHEK GUIDE) test strip Use as instructed  . iron polysaccharides (NIFEREX) 150 MG capsule TAKE 1 CAPSULE BY MOUTH EVERY DAY  . olmesartan (BENICAR) 40 MG tablet Take 1 tablet (40 mg total) by mouth daily.  . pantoprazole (PROTONIX) 40 MG tablet TAKE 1 TABLET BY MOUTH TWICE A DAY  . sucralfate (CARAFATE) 1 g tablet TAKE 1 TABLET (1 G TOTAL) BY MOUTH 4 (FOUR) TIMES DAILY - WITH MEALS AND AT BEDTIME.  . TMarland Kitchenstosterone (ANDROGEL) 40.5 MG/2.5GM (1.62%) GEL Apply 1 application topically daily. (Patient not taking: Reported on 03/08/2020)   No facility-administered encounter medications on file as of 05/06/2020.    Past Surgical History:  Procedure Laterality  Date  . collapsed lung    . HERNIA REPAIR    . PROSTATE SURGERY      Family History  Problem Relation Age of Onset  . Lung cancer Father   . Prostate cancer Father   . Stomach cancer Maternal Grandmother   . Prostate cancer Paternal Uncle   . Cervical cancer Daughter   . Psoriasis Daughter   . Arthritis Daughter        psoriatic arthritis    New complaints: Had carotid endarectomy in February and is doing well. Had follow up with vascular surgeon 03/04/20. Diagnosis after a light stroke in January. Is on plavix now with no bleeding issues.  Social history: Lives by  hisself and his daughters check on him daily.  Controlled substance contract: n/a    Review of Systems  Constitutional: Negative for diaphoresis.  Eyes: Negative for pain.  Respiratory: Negative for shortness of breath.   Cardiovascular: Negative for chest pain, palpitations and leg swelling.  Gastrointestinal: Negative for abdominal pain.  Endocrine: Negative for polydipsia.  Skin: Negative for rash.  Neurological: Negative for dizziness, weakness and headaches.  Hematological: Does not bruise/bleed easily.  All other systems reviewed and are negative.      Objective:   Physical Exam Vitals and nursing note reviewed.  Constitutional:      Appearance: Normal appearance. He is well-developed.  HENT:     Head: Normocephalic.     Nose: Nose normal.  Eyes:     Pupils: Pupils are equal, round, and reactive to light.  Neck:     Thyroid: No thyroid mass or thyromegaly.     Vascular: No carotid bruit or JVD.     Trachea: Phonation normal.  Cardiovascular:     Rate and Rhythm: Normal rate and regular rhythm.  Pulmonary:     Effort: Pulmonary effort is normal. No respiratory distress.     Breath sounds: Normal breath sounds.  Abdominal:     General: Bowel sounds are normal.     Palpations: Abdomen is soft.     Tenderness: There is no abdominal tenderness.  Musculoskeletal:        General: Normal range of motion.     Cervical back: Normal range of motion and neck supple.  Lymphadenopathy:     Cervical: No cervical adenopathy.  Skin:    General: Skin is warm and dry.  Neurological:     Mental Status: He is alert and oriented to person, place, and time.  Psychiatric:        Behavior: Behavior normal.        Thought Content: Thought content normal.        Judgment: Judgment normal.    BP (!) 142/69   Pulse (!) 57   Temp 98.4 F (36.9 C) (Temporal)   Resp 20   Ht _0  (1.702 m)   Wt 174 lb (78.9 kg)   SpO2 98%   BMI 27.25 kg/m   hgba1c 6.5%      Assessment &  Plan:  Rodney Hernandez comes in today with chief complaint of Medical Management of Chronic Issues   Diagnosis and orders addressed:  1. Essential hypertension Low sodium diet - CBC with Differential/Platelet - CMP14+EGFR - olmesartan (BENICAR) 40 MG tablet; Take 1 tablet (40 mg total) by mouth daily.  Dispense: 30 tablet; Refill: 0  2. Aortic atherosclerosis ([place])  3. Hyperlipidemia with target LDL less than 100 Low fat diet - Lipid panel - atorvastatin (LIPITOR) 40 MG tablet;  Take 1 tablet (40 mg total) by mouth daily.  Dispense: 90 tablet; Refill: 1  4. Gastroesophageal reflux disease without esophagitis Avoid spicy foods Do not eat 2 hours prior to bedtime - pantoprazole (PROTONIX) 40 MG tablet; Take 1 tablet (40 mg total) by mouth 2 (two) times daily.  Dispense: 180 tablet; Refill: 1  5. Hypogonadism in male  51. Psoriatic arthritis ([place]) Keep follow up with rheumatology  7. Benign prostatic hyperplasia with lower urinary tract symptoms, symptom details unspecified Report any voiding problems - PSA, total and free  8. Peripheral edema Elevate legs when sitting  9. Iron deficiency anemia, unspecified iron deficiency anemia type - iron polysaccharides (NIFEREX) 150 MG capsule; TAKE 1 CAPSULE BY MOUTH EVERY DAY  Dispense: 90 capsule; Refill: 1  10. Diabetes mellitus without complication ([place]) Watch carbs in diet - Bayer DCA Hb A1c Waived  11. Late effect of cerebrovascular accident (CVA)  43. Depression, major, single episode, moderate ([facility]) - escitalopram (LEXAPRO) 20 MG tablet; Take 1 tablet (20 mg total) by mouth daily.  Dispense: 90 tablet; Refill: 1   Labs pending Health Maintenance reviewed-hass appointment fo reye exam Diet and exercise encouraged  Follow up plan: 3 months   Mary-Margaret Hassell Done, FNP

## 2020-05-07 LAB — CMP14+EGFR
ALT: 18 IU/L (ref 0–44)
AST: 17 IU/L (ref 0–40)
Albumin/Globulin Ratio: 2 (ref 1.2–2.2)
Albumin: 4.5 g/dL (ref 3.7–4.7)
Alkaline Phosphatase: 103 IU/L (ref 44–121)
BUN/Creatinine Ratio: 18 (ref 10–24)
BUN: 19 mg/dL (ref 8–27)
Bilirubin Total: 0.4 mg/dL (ref 0.0–1.2)
CO2: 24 mmol/L (ref 20–29)
Calcium: 10.3 mg/dL — ABNORMAL HIGH (ref 8.6–10.2)
Chloride: 101 mmol/L (ref 96–106)
Creatinine, Ser: 1.06 mg/dL (ref 0.76–1.27)
Globulin, Total: 2.2 g/dL (ref 1.5–4.5)
Glucose: 109 mg/dL — ABNORMAL HIGH (ref 65–99)
Potassium: 5 mmol/L (ref 3.5–5.2)
Sodium: 140 mmol/L (ref 134–144)
Total Protein: 6.7 g/dL (ref 6.0–8.5)
eGFR: 72 mL/min/{1.73_m2} (ref >59)

## 2020-05-07 LAB — LIPID PANEL
Chol/HDL Ratio: 2.2 ratio (ref 0.0–5.0)
Cholesterol, Total: 93 mg/dL — ABNORMAL LOW (ref 100–199)
HDL: 42 mg/dL (ref >39)
LDL Chol Calc (NIH): 37 mg/dL (ref 0–99)
Triglycerides: 60 mg/dL (ref 0–149)
VLDL Cholesterol Cal: 14 mg/dL (ref 5–40)

## 2020-05-07 LAB — CBC WITH DIFFERENTIAL/PLATELET
Basophils Absolute: 0.1 10*3/uL (ref 0.0–0.2)
Basos: 1 %
EOS (ABSOLUTE): 0.2 10*3/uL (ref 0.0–0.4)
Eos: 3 %
Hematocrit: 43.2 % (ref 37.5–51.0)
Hemoglobin: 13.8 g/dL (ref 13.0–17.7)
Immature Grans (Abs): 0 10*3/uL (ref 0.0–0.1)
Immature Granulocytes: 0 %
Lymphocytes Absolute: 1.4 10*3/uL (ref 0.7–3.1)
Lymphs: 19 %
MCH: 26.1 pg — ABNORMAL LOW (ref 26.6–33.0)
MCHC: 31.9 g/dL (ref 31.5–35.7)
MCV: 82 fL (ref 79–97)
Monocytes Absolute: 1 10*3/uL — ABNORMAL HIGH (ref 0.1–0.9)
Monocytes: 14 %
Neutrophils Absolute: 4.7 10*3/uL (ref 1.4–7.0)
Neutrophils: 63 %
Platelets: 299 10*3/uL (ref 150–450)
RBC: 5.29 x10E6/uL (ref 4.14–5.80)
RDW: 14 % (ref 11.6–15.4)
WBC: 7.5 10*3/uL (ref 3.4–10.8)

## 2020-05-07 LAB — PSA, TOTAL AND FREE
PSA, Free Pct: 13.7 %
PSA, Free: 2.4 ng/mL
Prostate Specific Ag, Serum: 17.5 ng/mL — ABNORMAL HIGH (ref 0.0–4.0)

## 2020-05-12 ENCOUNTER — Telehealth: Payer: Self-pay

## 2020-05-12 NOTE — Telephone Encounter (Signed)
Rodney Hernandez had [facility] to [facility] on [facility]/06/20, patient wants these sent to [facility] instead.  [facility] already.

## 2020-05-20 ENCOUNTER — Other Ambulatory Visit: Payer: Self-pay | Admitting: Nurse Practitioner

## 2020-05-20 DIAGNOSIS — I10 Essential (primary) hypertension: Secondary | ICD-10-CM

## 2020-05-24 ENCOUNTER — Telehealth: Payer: Self-pay

## 2020-05-24 ENCOUNTER — Other Ambulatory Visit: Payer: Self-pay | Admitting: Nurse Practitioner

## 2020-05-24 DIAGNOSIS — I10 Essential (primary) hypertension: Secondary | ICD-10-CM

## 2020-05-24 MED ORDER — OLMESARTAN MEDOXOMIL 40 MG PO TABS: 40.0000 mg | ORAL_TABLET | Freq: Every day | ORAL | 4 refills | Status: DC

## 2020-05-24 MED ORDER — SUCRALFATE 1 G PO TABS: 1.0000 g | ORAL_TABLET | Freq: Three times a day (TID) | ORAL | 0 refills | Status: DC

## 2020-05-24 NOTE — Telephone Encounter (Signed)
Pt aware these have been sent to [facility]. And [facility] with [facility] & he is no longer using their pharmacy. [facility] have also taken [facility] out of [facility] list with [place].

## 2020-05-27 DIAGNOSIS — H25813 Combined forms of age-related cataract, bilateral: Secondary | ICD-10-CM | POA: Diagnosis not present

## 2020-05-27 DIAGNOSIS — H5210 Myopia, unspecified eye: Secondary | ICD-10-CM | POA: Diagnosis not present

## 2020-05-27 DIAGNOSIS — E7800 Pure hypercholesterolemia, unspecified: Secondary | ICD-10-CM | POA: Diagnosis not present

## 2020-05-27 DIAGNOSIS — Z0100 Encounter for examination of eyes and vision without abnormal findings: Secondary | ICD-10-CM | POA: Diagnosis not present

## 2020-05-27 LAB — HM DIABETES EYE EXAM

## 2020-06-08 ENCOUNTER — Ambulatory Visit (INDEPENDENT_AMBULATORY_CARE_PROVIDER_SITE_OTHER): Payer: Medicare HMO

## 2020-06-08 VITALS — Ht 67.0 in | Wt 175.0 lb

## 2020-06-08 DIAGNOSIS — Z0000 Encounter for general adult medical examination without abnormal findings: Secondary | ICD-10-CM | POA: Diagnosis not present

## 2020-06-08 NOTE — Patient Instructions (Addendum)
[name] , Thank you for taking time to come for your Medicare Wellness Visit. I appreciate your ongoing commitment to your health goals. Please review the following plan we discussed and let me know if I can assist you in the future.   Screening recommendations/referrals: Colonoscopy: Done 09/05/2017 - Repeat in 3 years (call Dr Andres Shad to verify appointment) Recommended yearly ophthalmology/optometry visit for glaucoma screening and checkup Recommended yearly dental visit for hygiene and checkup  Vaccinations: Influenza vaccine: Done 11/06/2019 - Repeat annually Pneumococcal vaccine: Done 11/10/2011 & 03/02/2014 Tdap vaccine: Done 03/02/2014 - Repeat in 10 years Shingles vaccine: Due Shingrix discussed. Please contact your pharmacy for coverage information.    Covid-19: Declined  Advanced directives: Please bring a copy of your health care power of attorney and living will to the office to be added to your chart at your convenience.  Conditions/risks identified: Aim for 30 minutes of exercise or brisk walking each day, drink 6-8 glasses of water and eat lots of fruits and vegetables.  Next appointment: Follow up in one year for your annual wellness visit.   Preventive Care 70 Years and Older, Male  Preventive care refers to lifestyle choices and visits with your health care provider that can promote health and wellness. What does preventive care include?  A yearly physical exam. This is also called an annual well check.  Dental exams once or twice a year.  Routine eye exams. Ask your health care provider how often you should have your eyes checked.  Personal lifestyle choices, including:  Daily care of your teeth and gums.  Regular physical activity.  Eating a healthy diet.  Avoiding tobacco and drug use.  Limiting alcohol use.  Practicing safe sex.  Taking low doses of aspirin every day.  Taking vitamin and mineral supplements as recommended by your health care  provider. What happens during an annual well check? The services and screenings done by your health care provider during your annual well check will depend on your age, overall health, lifestyle risk factors, and family history of disease. Counseling  Your health care provider may ask you questions about your:  Alcohol use.  Tobacco use.  Drug use.  Emotional well-being.  Home and relationship well-being.  Sexual activity.  Eating habits.  History of falls.  Memory and ability to understand (cognition).  Work and work Statistician. Screening  You may have the following tests or measurements:  Height, weight, and BMI.  Blood pressure.  Lipid and cholesterol levels. These may be checked every 5 years, or more frequently if you are over 23 years old.  Skin check.  Lung cancer screening. You may have this screening every year starting at age 49 if you have a 30-pack-year history of smoking and currently smoke or have quit within the past 15 years.  Fecal occult blood test (FOBT) of the stool. You may have this test every year starting at age 3.  Flexible sigmoidoscopy or colonoscopy. You may have a sigmoidoscopy every 5 years or a colonoscopy every 10 years starting at age 88.  Prostate cancer screening. Recommendations will vary depending on your family history and other risks.  Hepatitis C blood test.  Hepatitis B blood test.  Sexually transmitted disease (STD) testing.  Diabetes screening. This is done by checking your blood sugar (glucose) after you have not eaten for a while (fasting). You may have this done every 1-3 years.  Abdominal aortic aneurysm (AAA) screening. You may need this if you are a current  or former smoker.  Osteoporosis. You may be screened starting at age 65 if you are at high risk. Talk with your health care provider about your test results, treatment options, and if necessary, the need for more tests. Vaccines  Your health care provider  may recommend certain vaccines, such as:  Influenza vaccine. This is recommended every year.  Tetanus, diphtheria, and acellular pertussis (Tdap, Td) vaccine. You may need a Td booster every 10 years.  Zoster vaccine. You may need this after age 93.  Pneumococcal 13-valent conjugate (PCV13) vaccine. One dose is recommended after age 32.  Pneumococcal polysaccharide (PPSV23) vaccine. One dose is recommended after age 18. Talk to your health care provider about which screenings and vaccines you need and how often you need them. This information is not intended to replace advice given to you by your health care provider. Make sure you discuss any questions you have with your health care provider. Document Released: 01/22/2015 Document Revised: 09/15/2015 Document Reviewed: 10/27/2014 Elsevier Interactive Patient Education  2017 [place] Prevention in the Home Falls can cause injuries. They can happen to people of all ages. There are many things you can do to make your home safe and to help prevent falls. What can I do on the outside of my home?  Regularly fix the edges of walkways and driveways and fix any cracks.  Remove anything that might make you trip as you walk through a door, such as a raised step or threshold.  Trim any bushes or trees on the path to your home.  Use bright outdoor lighting.  Clear any walking paths of anything that might make someone trip, such as rocks or tools.  Regularly check to see if handrails are loose or broken. Make sure that both sides of any steps have handrails.  Any raised decks and porches should have guardrails on the edges.  Have any leaves, snow, or ice cleared regularly.  Use sand or salt on walking paths during winter.  Clean up any spills in your garage right away. This includes oil or grease spills. What can I do in the bathroom?  Use night lights.  Install grab bars by the toilet and in the tub and shower. Do not use  towel bars as grab bars.  Use non-skid mats or decals in the tub or shower.  If you need to sit down in the shower, use a plastic, non-slip stool.  Keep the floor dry. Clean up any water that spills on the floor as soon as it happens.  Remove soap buildup in the tub or shower regularly.  Attach bath mats securely with double-sided non-slip rug tape.  Do not have throw rugs and other things on the floor that can make you trip. What can I do in the bedroom?  Use night lights.  Make sure that you have a light by your bed that is easy to reach.  Do not use any sheets or blankets that are too big for your bed. They should not hang down onto the floor.  Have a firm chair that has side arms. You can use this for support while you get dressed.  Do not have throw rugs and other things on the floor that can make you trip. What can I do in the kitchen?  Clean up any spills right away.  Avoid walking on wet floors.  Keep items that you use a lot in easy-to-reach places.  If you need to reach something above you,  use a strong step stool that has a grab bar.  Keep electrical cords out of the way.  Do not use floor polish or wax that makes floors slippery. If you must use wax, use non-skid floor wax.  Do not have throw rugs and other things on the floor that can make you trip. What can I do with my stairs?  Do not leave any items on the stairs.  Make sure that there are handrails on both sides of the stairs and use them. Fix handrails that are broken or loose. Make sure that handrails are as long as the stairways.  Check any carpeting to make sure that it is firmly attached to the stairs. Fix any carpet that is loose or worn.  Avoid having throw rugs at the top or bottom of the stairs. If you do have throw rugs, attach them to the floor with carpet tape.  Make sure that you have a light switch at the top of the stairs and the bottom of the stairs. If you do not have them, ask  someone to add them for you. What else can I do to help prevent falls?  Wear shoes that:  Do not have high heels.  Have rubber bottoms.  Are comfortable and fit you well.  Are closed at the toe. Do not wear sandals.  If you use a stepladder:  Make sure that it is fully opened. Do not climb a closed stepladder.  Make sure that both sides of the stepladder are locked into place.  Ask someone to hold it for you, if possible.  Clearly mark and make sure that you can see:  Any grab bars or handrails.  First and last steps.  Where the edge of each step is.  Use tools that help you move around (mobility aids) if they are needed. These include:  Canes.  Walkers.  Scooters.  Crutches.  Turn on the lights when you go into a dark area. Replace any light bulbs as soon as they burn out.  Set up your furniture so you have a clear path. Avoid moving your furniture around.  If any of your floors are uneven, fix them.  If there are any pets around you, be aware of where they are.  Review your medicines with your doctor. Some medicines can make you feel dizzy. This can increase your chance of falling. Ask your doctor what other things that you can do to help prevent falls. This information is not intended to replace advice given to you by your health care provider. Make sure you discuss any questions you have with your health care provider. Document Released: 10/22/2008 Document Revised: 06/03/2015 Document Reviewed: 01/30/2014 Elsevier Interactive Patient Education  2017 [employer]  Managing Pain Without Opioids Opioids are strong medicines used to treat moderate to severe pain. For some people, especially those who have long-term (chronic) pain, opioids may not be the best choice for pain management due to:  Side effects like nausea, constipation, and sleepiness.  The risk of addiction (opioid use disorder). The longer you take opioids, the greater your risk of  addiction. Pain that lasts for more than 3 months is called chronic pain. Managing chronic pain usually requires more than one approach and is often provided by a team of health care providers working together (multidisciplinary approach). Pain management may be done at a pain management center or pain clinic. Types of pain management without opioids Managing pain without opioids can involve:  Non-opioid medicines.  Exercises to help relieve pain and improve strength and range of motion (physical therapy).  Therapy to help with everyday tasks and activities (occupational therapy).  Therapy to help you find ways to relieve pain by doing things you enjoy (recreational therapy).  Talk therapy (psychotherapy) and other mental health therapies.  Medical treatments such as injections or devices.  Making lifestyle changes. Pain management options Non-opioid medicines Non-opioid medicines for pain may include medicines taken by mouth (oral medicines), such as:  Over-the-counter or prescription NSAIDs. These may be the first medicines used for pain. They work well for muscle and bone pain, and they reduce swelling.  Acetaminophen. This over-the-counter medicine may work well for milder pain but not swelling.  Antidepressants. These may be used to treat chronic pain. A certain type of antidepressant (tricyclics) is often used. These medicines are given in lower doses for pain than when used for depression.  Anticonvulsants. These are usually used to treat seizures but may also reduce nerve (neuropathic) pain.  Muscle relaxants. These relieve pain caused by sudden muscle tightening (spasms). You may also use a type of pain medicine that is applied to the skin as a patch, cream, or gel (topical analgesic), such as a numbing medicine. These may cause fewer side effects than oral medicines. Therapy Physical therapy involves doing exercises to gain strength and flexibility. A physical therapist may  teach you exercises to move and stretch parts of your body that are weak, stiff, or painful. You can learn these exercises at physical therapy visits and practice them at home. Physical therapy may also involve:  Massage.  Heat wraps or applying heat or cold to affected areas.  Sending electrical signals through the skin to interrupt pain signals (transcutaneous electrical nerve stimulation, TENS).  Sending weak lasers through the skin to reduce pain and swelling (low-level laser therapy).  Using signals from your body to help you learn to regulate pain (biofeedback). Occupational therapy helps you learn ways to function at home and work with less pain. Recreational therapy may involve trying new activities or hobbies, such as drawing or a physical activity. Types of mental health therapy for pain include:  Cognitive behavioral therapy (CBT) to help you learn coping skills for dealing with pain.  Acceptance and commitment therapy (ACT) to change the way you think and react to pain.  Relaxation therapies, including muscle relaxation exercises and focusing your mind on the present moment to lower stress (mindfulness-based stress reduction).  Pain management counseling. This may be individual, family, or group counseling.   Medical treatments Medical treatments for pain management include:  Nerve block injections. These may include a pain blocker and anti-inflammatory medicines. You may have injections: ? Near the spine to relieve chronic back or neck pain. ? Into joints to relieve back or joint pain. ? Into nerve areas that supply a painful area to relieve body pain. ? Into muscles (trigger point injections) to relieve some painful muscle conditions.  A medical device placed near your spine to help block pain signals and relieve nerve pain or chronic back pain (spinal cord stimulation device).  Acupuncture. Follow these instructions at home Medicines  Take over-the-counter and  prescription medicines only as told by your health care provider.  If you are taking pain medicine, ask your health care providers about possible side effects to watch out for.  Do not drive or use heavy machinery while taking prescription pain medicine. Lifestyle  Do not use drugs or alcohol to reduce pain. Limit alcohol intake  to no more than 1 drink a day for nonpregnant women and 2 drinks a day for men. One drink equals 12 oz of beer, 5 oz of wine, or 1 oz of hard liquor.  Do not use any products that contain nicotine or tobacco, such as cigarettes and e-cigarettes. These can delay healing. If you need help quitting, ask your health care provider.  Eat a healthy diet and maintain a healthy weight. Poor diet and excess weight may make pain worse. ? Eat foods that are high in fiber. These include fresh fruits and vegetables, whole grains, and beans. ? Limit foods that are high in fat and processed sugars, such as fried and sweet foods.  Exercise regularly. Exercise lowers stress and may help relieve pain. ? Ask your health care provider what activities and exercises are safe for you. ? If your health care provider approves, join an exercise class that combines movement and stress reduction. Examples include yoga and tai chi.  Get enough sleep. Lack of sleep may make pain worse.  Lower stress as much as possible. Practice stress reduction techniques as told by your therapist.   General instructions  Work with all your pain management providers to find the treatments that work best for you. You are an important member of your pain management team. There are many things you can do to reduce pain on your own.  Consider joining an online or in-person support group for people who have chronic pain.  Keep all follow-up visits as told by your health care providers. This is important. Where to find more information You can find more information about managing pain without opioids  from:  American Academy of Pain Medicine: painmed.[place] for Chronic Pain: instituteforchronicpain.org  American Chronic Pain Association: theacpa.org Contact a health care provider if:  You have side effects from pain medicine.  Your pain gets worse or does not get better with treatments or home care.  You are struggling with anxiety or depression. Summary  Many types of pain can be managed without opioids. Chronic pain may respond better to pain management without opioids.  Pain is best managed with a team of providers working together.  Pain management without opioids may include non-opioid medicines, medical treatments, physical therapy, mental health therapy, and lifestyle changes.  Tell your health care providers if your pain gets worse or is not being managed well enough. This information is not intended to replace advice given to you by your health care provider. Make sure you discuss any questions you have with your health care provider. Document Revised: 10/09/2019 Document Reviewed: 10/09/2019 Elsevier Patient Education  [place].

## 2020-06-08 NOTE — Progress Notes (Signed)
Subjective:   Rodney Hernandez is a 75 y.o. male who presents for Medicare Annual/Subsequent preventive examination.  Virtual Visit via Telephone Note  I connected with  Rodney Hernandez on 06/08/20 at  3:30 PM EDT by telephone and verified that I am speaking with the correct person using two identifiers.  Location: Patient: Home Provider: [facility] Persons participating in the virtual visit: patient/Nurse Health Advisor   I discussed the limitations, risks, security and privacy concerns of performing an evaluation and management service by telephone and the availability of in person appointments. The patient expressed understanding and agreed to proceed.  Interactive audio and video telecommunications were attempted between this nurse and patient, however failed, due to patient having technical difficulties OR patient did not have access to video capability.  We continued and completed visit with audio only.  Some vital signs may be absent or patient reported.   Rodney Feuerborn E Yilin Weedon, Rodney Hernandez   Review of Systems     Cardiac Risk Factors include: advanced age (>28mn, >>54women);diabetes mellitus;dyslipidemia;family history of premature cardiovascular disease;hypertension;male gender;sedentary lifestyle     Objective:    Today's Vitals   06/08/20 1534  Weight: 175 lb (79.4 kg)  Height: '5\' 7"'  (1.702 m)   Body mass index is 27.41 kg/m.  Advanced Directives 06/08/2020 05/08/2019 12/03/2018 05/07/2018 05/07/2018 04/09/2017  Does Patient Have a Medical Advance Directive? Yes Yes Yes - No Yes  Type of [place] Living will;Healthcare Power of [place] Does patient want to make changes to medical advance directive? - No - Patient declined No - Patient declined - - -  Copy of [place] Chart? No - copy requested No - copy requested No - copy requested - - No - copy requested   Would patient like information on creating a medical advance directive? - - - Yes (MAU/Ambulatory/Procedural Areas - Information given) No - Patient declined -    Current Medications (verified) Outpatient Encounter Medications as of 06/08/2020  Medication Sig  . albuterol (VENTOLIN HFA) 108 (90 Base) MCG/ACT inhaler TAKE 2 PUFFS BY MOUTH EVERY 6 HOURS AS NEEDED FOR WHEEZE OR SHORTNESS OF BREATH  . aspirin 81 MG chewable tablet Chew 81 mg by mouth daily.  .Marland Kitchenatorvastatin (LIPITOR) 40 MG tablet Take 1 tablet (40 mg total) by mouth daily.  . blood glucose meter kit and supplies KIT Dispense based on patient and insurance preference. Use up to four times daily as directed. (FOR ICD-9 250.00, 250.01).  . cholecalciferol (VITAMIN D3) 25 MCG (1000 UNIT) tablet Take 1 tablet (1,000 Units total) by mouth daily.  . clopidogrel (PLAVIX) 75 MG tablet Take 1 tablet (75 mg total) by mouth daily.  .Marland Kitchenescitalopram (LEXAPRO) 20 MG tablet Take 1 tablet (20 mg total) by mouth daily.  .Marland Kitchenglucose blood (ACCU-CHEK GUIDE) test strip Use as instructed  . iron polysaccharides (NIFEREX) 150 MG capsule TAKE 1 CAPSULE BY MOUTH EVERY DAY  . olmesartan (BENICAR) 40 MG tablet Take 1 tablet (40 mg total) by mouth daily.  . pantoprazole (PROTONIX) 40 MG tablet Take 1 tablet (40 mg total) by mouth 2 (two) times daily.  . sucralfate (CARAFATE) 1 g tablet Take 1 tablet (1 g total) by mouth 4 (four) times daily -  with meals and at bedtime.  . traMADol (ULTRAM) 50 MG tablet    No facility-administered encounter medications on file as of 06/08/2020.  Allergies (verified) Nsaids, Aspirin, Fluticasone, Morphine and related, and Sulfa antibiotics   History: Past Medical History:  Diagnosis Date  . Arthritis    psoriatic arthritis and osteo arthritis  . Asthma   . Collapse of lung   . Colon polyps   . DOE (dyspnea on exertion) 12/02/2018  . Eczema   . GERD (gastroesophageal reflux disease)   . Hypertension   . Seasonal  allergies   . Stroke ([place])   . Vertigo    Past Surgical History:  Procedure Laterality Date  . collapsed lung    . HERNIA REPAIR    . PROSTATE SURGERY     Family History  Problem Relation Age of Onset  . Lung cancer Father   . Prostate cancer Father   . Stomach cancer Maternal Grandmother   . Prostate cancer Paternal Uncle   . Cervical cancer Daughter   . Psoriasis Daughter   . Arthritis Daughter        psoriatic arthritis   Social History   Socioeconomic History  . Marital status: Widowed    Spouse name: Not on file  . Number of children: 2  . Years of education: 39  . Highest education level: High school graduate  Occupational History  . Occupation: Retired    Comment: worked in a Writer for 33 years  Tobacco Use  . Smoking status: Former Smoker    Packs/day: 2.00    Years: 22.00    Pack years: 44.00    Types: Cigarettes    Quit date: 01/09/1981    Years since quitting: 39.4  . Smokeless tobacco: Never Used  Vaping Use  . Vaping Use: Never used  Substance and Sexual Activity  . Alcohol use: No  . Drug use: No  . Sexual activity: Not Currently  Other Topics Concern  . Not on file  Social History Narrative   Widowed, 2 children   Right handed   12th grade   3 cups daily   Social Determinants of Health   Financial Resource Strain: Low Risk   . Difficulty of Paying Living Expenses: Not hard at all  Food Insecurity: No Food Insecurity  . Worried About Charity fundraiser in the Last Year: Never true  . Ran Out of Food in the Last Year: Never true  Transportation Needs: No Transportation Needs  . Lack of Transportation (Medical): No  . Lack of Transportation (Non-Medical): No  Physical Activity: Sufficiently Active  . Days of Exercise per Week: 4 days  . Minutes of Exercise per Session: 40 min  Stress: No Stress Concern Present  . Feeling of Stress : Not at all  Social Connections: Moderately Isolated  . Frequency of Communication with Friends  and Family: More than three times a week  . Frequency of Social Gatherings with Friends and Family: Once a week  . Attends Religious Services: More than 4 times per year  . Active Member of Clubs or Organizations: No  . Attends Archivist Meetings: Never  . Marital Status: Widowed    Tobacco Counseling Counseling given: Not Answered   Clinical Intake:  Pre-visit preparation completed: Yes  Pain : No/denies pain     BMI - recorded: 27.41 Nutritional Status: BMI 25 -29 Overweight Nutritional Risks: None Diabetes: Yes CBG done?: No Did pt. bring in CBG monitor from home?: No  How often do you need to have someone help you when you read instructions, pamphlets, or other written materials from your doctor or  pharmacy?: 1 - Never  Nutrition Risk Assessment:  Has the patient had any N/V/D within the last 2 months?  No  Does the patient have any non-healing wounds?  No  Has the patient had any unintentional weight loss or weight gain?  No   Diabetes:  Is the patient diabetic?  Yes  If diabetic, was a CBG obtained today?  No  Did the patient bring in their glucometer from home?  No  How often do you monitor your CBG's? Once daily fasting.   Financial Strains and Diabetes Management:  Are you having any financial strains with the device, your supplies or your medication? No .  Does the patient want to be seen by Chronic Care Management for management of their diabetes?  No  Would the patient like to be referred to a Nutritionist or for Diabetic Management?  No   Diabetic Exams:  Diabetic Eye Exam: Completed 04/2020.   Diabetic Foot Exam: Completed 05/06/20. Pt has been advised about the importance in completing this exam. Pt is scheduled for diabetic foot exam on next year.    Interpreter Needed?: No  Information entered by :: Rodney Vanaken, Rodney Hernandez   Activities of Daily Living In your present state of health, do you have any difficulty performing the following  activities: 06/08/2020  Hearing? Y  Vision? N  Difficulty concentrating or making decisions? Y  Walking or climbing stairs? Y  Dressing or bathing? N  Doing errands, shopping? N  Preparing Food and eating ? N  Using the Toilet? N  In the past six months, have you accidently leaked urine? N  Do you have problems with loss of bowel control? N  Managing your Medications? N  Managing your Finances? N  Housekeeping or managing your Housekeeping? N  Some recent data might be hidden    Patient Care Team: Chevis Pretty, FNP as PCP - General (Nurse Practitioner) Arnoldo Lenis, MD as PCP - Cardiology (Cardiology) Prudencio Pair as Physician Assistant (Emergency Medicine) Henreitta Leber Marisue Brooklyn, MD as Referring Physician (Urology) Andres Shad Julieta Bellini, MD as Referring Physician (Gastroenterology)  Indicate any recent Medical Services you may have received from other than [facility] providers in the past year (date may be approximate).     Assessment:   This is a routine wellness examination for Rodney Hernandez.  Hearing/Vision screen  Hearing Screening   '125Hz'  '250Hz'  '500Hz'  '1000Hz'  '2000Hz'  '3000Hz'  '4000Hz'  '6000Hz'  '8000Hz'   Right ear:           Left ear:           Comments: C/o moderate hearing loss - declines hearing aids  Vision Screening Comments: Wears eyeglasses - Routine visits at [facility] in [place] - up to date with eye exam  Dietary issues and exercise activities discussed: Current Exercise Habits: Home exercise routine, Type of exercise: walking, Time (Minutes): 45, Frequency (Times/Week): 4, Weekly Exercise (Minutes/Week): 180, Intensity: Mild, Exercise limited by: orthopedic condition(s);neurologic condition(s)  Goals Addressed            This Visit's Progress   . Exercise 3x per week (30 min per time)   On track    Increase walking to 30-45 minutes at least 3 days per week.   05/08/2019 AWV Goal: Exercise for General Health   Patient will verbalize understanding of the  benefits of increased physical activity:  Exercising regularly is important. It will improve your overall fitness, flexibility, and endurance.  Regular exercise also will improve your overall health. It can help  you control your weight, reduce stress, and improve your bone density.  Over the next year, patient will increase physical activity as tolerated with a goal of at least 150 minutes of moderate physical activity per week.   You can tell that you are exercising at a moderate intensity if your heart starts beating faster and you start breathing faster but can still hold a conversation.  Moderate-intensity exercise ideas include:  Walking 1 mile (1.6 km) in about 15 minutes  Biking  Hiking  Golfing  Dancing  Water aerobics  Patient will verbalize understanding of everyday activities that increase physical activity by providing examples like the following: ? Yard work, such as: ? Pushing a Conservation officer, nature ? Raking and bagging leaves ? Washing your car ? Pushing a stroller ? Shoveling snow ? Gardening ? Washing windows or floors  Patient will be able to explain general safety guidelines for exercising:   Before you start a new exercise program, talk with your health care provider.  Do not exercise so much that you hurt yourself, feel dizzy, or get very short of breath.  Wear comfortable clothes and wear shoes with good support.  Drink plenty of water while you exercise to prevent dehydration or heat stroke.  Work out until your breathing and your heartbeat get faster.     . Have 3 meals a day   Improving    05/08/2019 AWV Goal: Improved Nutrition/Diet  . Patient will verbalize understanding that diet plays an important role in overall health and that a poor diet is a risk factor for many chronic medical conditions.  . Over the next year, patient will improve self management of their diet by incorporating better variety, improved meal pattern, more consistent meal  timing, increased physical activity, better food choices, watch portion sizes/amount of food eaten at one time, and eat 6 small meals per day. . Patient will utilize available community resources to help with food acquisition if needed (ex: food pantries, [address], etc) . Patient will work with nutrition specialist if a referral was made       Depression Screen PHQ 2/9 Scores 06/08/2020 06/08/2020 05/06/2020 03/08/2020 02/04/2020 05/08/2019 05/13/2018  PHQ - 2 Score 0 0 0 0 0 0 0  PHQ- 9 Score - - - 4 - - -    Fall Risk Fall Risk  06/08/2020 05/06/2020 03/08/2020 02/04/2020 02/04/2020  Falls in the past year? 0 0 0 0 0  Number falls in past yr: 0 - - - -  Injury with Fall? 0 - - - -  Risk for fall due to : History of fall(s);Impaired balance/gait;Impaired vision;Medication side effect;Orthopedic patient - - - -  Follow up Education provided;Falls prevention discussed - - - -    FALL RISK PREVENTION PERTAINING TO THE HOME:  Any stairs in or around the home? Yes  If so, are there any without handrails? No  Home free of loose throw rugs in walkways, pet beds, electrical cords, etc? Yes  Adequate lighting in your home to reduce risk of falls? Yes   ASSISTIVE DEVICES UTILIZED TO PREVENT FALLS:  Life alert? No  Use of a cane, walker or w/c? No  Grab bars in the bathroom? Yes  Shower chair or bench in shower? No  Elevated toilet seat or a handicapped toilet? No   TIMED UP AND GO:  Was the test performed? No . Telephonic visit  Cognitive Function: MMSE - Mini Mental State Exam 04/10/2017  Orientation to time 5  Orientation to Place 5  Registration 3  Attention/ Calculation 5  Recall 1  Language- name 2 objects 2  Language- repeat 1  Language- follow 3 step command 3  Language- read & follow direction 1  Write a sentence 1  Copy design 1  Total score 28     6CIT Screen 06/08/2020 05/08/2019 05/07/2018  What Year? 0 points 0 points 0 points  What month? 0 points 0 points 0 points  What  time? 0 points 0 points 0 points  Count back from 20 0 points 0 points 0 points  Months in reverse 0 points 0 points 0 points  Repeat phrase 0 points 0 points 0 points  Total Score 0 0 0    Immunizations Immunization History  Administered Date(s) Administered  . Fluad Quad(high Dose 65+) 10/11/2018, 11/06/2019  . Influenza, High Dose Seasonal PF 10/20/2013, 10/11/2015, 10/11/2016, 11/09/2017  . Influenza,inj,Quad PF,6+ Mos 10/14/2014  . Pneumococcal Conjugate-13 03/02/2014  . Pneumococcal Polysaccharide-23 11/10/2011  . Td 03/02/2014    TDAP status: Up to date  Flu Vaccine status: Up to date  Pneumococcal vaccine status: Up to date  Covid-19 vaccine status: Declined, Education has been provided regarding the importance of this vaccine but patient still declined. Advised may receive this vaccine at local pharmacy or Health Dept.or vaccine clinic. Aware to provide a copy of the vaccination record if obtained from local pharmacy or Health Dept. Verbalized acceptance and understanding.  Qualifies for Shingles Vaccine? Yes   Zostavax completed Yes   Shingrix Completed?: No.    Education has been provided regarding the importance of this vaccine. Patient has been advised to call insurance company to determine out of pocket expense if they have not yet received this vaccine. Advised may also receive vaccine at local pharmacy or Health Dept. Verbalized acceptance and understanding.  Screening Tests Health Maintenance  Topic Date Due  . OPHTHALMOLOGY EXAM  Never done  . Zoster Vaccines- Shingrix (1 of 2) Never done  . INFLUENZA VACCINE  08/09/2020  . COLONOSCOPY (Pts 45-71yr Insurance coverage will need to be confirmed)  09/05/2020  . HEMOGLOBIN A1C  11/05/2020  . FOOT EXAM  05/06/2021  . TETANUS/TDAP  03/02/2024  . Hepatitis C Screening  Completed  . PNA vac Low Risk Adult  Completed  . HPV VACCINES  Aged Out  . COVID-19 Vaccine  Discontinued    Health Maintenance  Health  Maintenance Due  Topic Date Due  . OPHTHALMOLOGY EXAM  Never done  . Zoster Vaccines- Shingrix (1 of 2) Never done    Colorectal cancer screening: Type of screening: Colonoscopy. Completed 09/05/2017. Repeat every 3 years  Lung Cancer Screening: (Low Dose CT Chest recommended if Age 77-80years, 30 pack-year currently smoking OR have quit w/in 15years.) does not qualify.   Additional Screening:  Hepatitis C Screening: does qualify; Completed 05/13/2015  Vision Screening: Recommended annual ophthalmology exams for early detection of glaucoma and other disorders of the eye. Is the patient up to date with their annual eye exam?  Yes  Who is the provider or what is the name of the office in which the patient attends annual eye exams? [email or web address] is not established with a provider, would they like to be referred to a provider to establish care? No .   Dental Screening: Recommended annual dental exams for proper oral hygiene  Community Resource Referral / Chronic Care Management: CRR required this visit?  No   CCM required this visit?  No  Plan:     I have personally reviewed and noted the following in the patient's chart:   . Medical and social history . Use of alcohol, tobacco or illicit drugs  . Current medications and supplements including opioid prescriptions. Patient is currently taking opioid prescriptions. Information provided to patient regarding non-opioid alternatives. Patient advised to discuss non-opioid treatment plan with their provider. . Functional ability and status . Nutritional status . Physical activity . Advanced directives . List of other physicians . Hospitalizations, surgeries, and ER visits in previous 12 months . Vitals . Screenings to include cognitive, depression, and falls . Referrals and appointments  In addition, I have reviewed and discussed with patient certain preventive protocols, quality metrics, and best practice  recommendations. A written personalized care plan for preventive services as well as general preventive health recommendations were provided to patient.     Sandrea Hammond, Rodney Hernandez   [id number]   Nurse Notes: None

## 2020-06-23 ENCOUNTER — Other Ambulatory Visit: Payer: Self-pay | Admitting: Nurse Practitioner

## 2020-07-20 ENCOUNTER — Telehealth: Payer: Self-pay | Admitting: Nurse Practitioner

## 2020-07-20 MED ORDER — SUCRALFATE 1 G PO TABS: ORAL_TABLET | ORAL | 2 refills | Status: DC

## 2020-07-20 NOTE — Telephone Encounter (Signed)
Medication refilled, patient aware 

## 2020-07-20 NOTE — Telephone Encounter (Signed)
  Prescription Request  07/20/2020  What is the name of the medication or equipment? sucralfate (CARAFATE) 1 g tablet  Have you contacted your pharmacy to request a refill? (if applicable) no  Which pharmacy would you like this sent to? [facility]   Patient notified that their request is being sent to the clinical staff for review and that they should receive a response within 2 business days.

## 2020-08-05 ENCOUNTER — Ambulatory Visit: Payer: Self-pay | Admitting: Nurse Practitioner

## 2020-08-09 ENCOUNTER — Encounter: Payer: Self-pay | Admitting: Nurse Practitioner

## 2020-08-09 ENCOUNTER — Ambulatory Visit (INDEPENDENT_AMBULATORY_CARE_PROVIDER_SITE_OTHER): Payer: Medicare HMO | Admitting: Nurse Practitioner

## 2020-08-09 ENCOUNTER — Other Ambulatory Visit: Payer: Self-pay

## 2020-08-09 VITALS — BP 165/80 | HR 63 | Temp 97.7°F | Ht 67.0 in | Wt 176.2 lb

## 2020-08-09 DIAGNOSIS — R609 Edema, unspecified: Secondary | ICD-10-CM | POA: Diagnosis not present

## 2020-08-09 DIAGNOSIS — I6930 Unspecified sequelae of cerebral infarction: Secondary | ICD-10-CM

## 2020-08-09 DIAGNOSIS — N401 Enlarged prostate with lower urinary tract symptoms: Secondary | ICD-10-CM

## 2020-08-09 DIAGNOSIS — E785 Hyperlipidemia, unspecified: Secondary | ICD-10-CM | POA: Diagnosis not present

## 2020-08-09 DIAGNOSIS — I700 Atherosclerosis of aorta: Secondary | ICD-10-CM

## 2020-08-09 DIAGNOSIS — D509 Iron deficiency anemia, unspecified: Secondary | ICD-10-CM | POA: Diagnosis not present

## 2020-08-09 DIAGNOSIS — Z9889 Other specified postprocedural states: Secondary | ICD-10-CM

## 2020-08-09 DIAGNOSIS — K219 Gastro-esophageal reflux disease without esophagitis: Secondary | ICD-10-CM

## 2020-08-09 DIAGNOSIS — E119 Type 2 diabetes mellitus without complications: Secondary | ICD-10-CM

## 2020-08-09 DIAGNOSIS — I639 Cerebral infarction, unspecified: Secondary | ICD-10-CM

## 2020-08-09 DIAGNOSIS — I10 Essential (primary) hypertension: Secondary | ICD-10-CM

## 2020-08-09 DIAGNOSIS — F321 Major depressive disorder, single episode, moderate: Secondary | ICD-10-CM

## 2020-08-09 LAB — BAYER DCA HB A1C WAIVED: HB A1C (BAYER DCA - WAIVED): 6.9 % (ref <7.0)

## 2020-08-09 MED ORDER — ATORVASTATIN CALCIUM 40 MG PO TABS: 40.0000 mg | ORAL_TABLET | Freq: Every day | ORAL | 1 refills | Status: DC

## 2020-08-09 MED ORDER — POLYSACCHARIDE IRON COMPLEX 150 MG PO CAPS: ORAL_CAPSULE | ORAL | 1 refills | Status: DC

## 2020-08-09 MED ORDER — CLOPIDOGREL BISULFATE 75 MG PO TABS: 75.0000 mg | ORAL_TABLET | Freq: Every day | ORAL | 1 refills | Status: DC

## 2020-08-09 MED ORDER — ESCITALOPRAM OXALATE 20 MG PO TABS: 20.0000 mg | ORAL_TABLET | Freq: Every day | ORAL | 1 refills | Status: DC

## 2020-08-09 MED ORDER — TRAMADOL HCL 50 MG PO TABS: 50.0000 mg | ORAL_TABLET | Freq: Two times a day (BID) | ORAL | 2 refills | Status: DC | PRN

## 2020-08-09 MED ORDER — OLMESARTAN MEDOXOMIL 40 MG PO TABS: 40.0000 mg | ORAL_TABLET | Freq: Every day | ORAL | 1 refills | Status: DC

## 2020-08-09 MED ORDER — PANTOPRAZOLE SODIUM 40 MG PO TBEC: 40.0000 mg | DELAYED_RELEASE_TABLET | Freq: Two times a day (BID) | ORAL | 1 refills | Status: DC

## 2020-08-09 MED ORDER — HYDROCHLOROTHIAZIDE 12.5 MG PO CAPS: 12.5000 mg | ORAL_CAPSULE | Freq: Every day | ORAL | 1 refills | Status: DC

## 2020-08-09 NOTE — Progress Notes (Addendum)
Subjective:    Patient ID: Rodney Hernandez, male    DOB: 1945-04-09, 75 y.o.   MRN: [id number]   Chief Complaint: medical management of chronic issues     HPI:  1. Essential hypertension No c/o chest pain, sob or headache. Does not check bloodpressure at home. BP Readings from Last 3 Encounters:  05/06/20 (!) 142/69  03/08/20 126/66  02/04/20 (!) 97/57     2. Hyperlipidemia with target LDL less than 100 Does not wtahc diet and does no dedicated exercise. Lab Results  Component Value Date   CHOL 93 (L) 05/06/2020   HDL 42 05/06/2020   LDLCALC 37 05/06/2020   TRIG 60 05/06/2020   CHOLHDL 2.2 05/06/2020     3. Type 2 diabetes mellitus without complication, without long-term current use of insulin (HCC) Is currently doing diet control. Blood sugars are around 120. No low blood sugars Lab Results  Component Value Date   HGBA1C 6.5 05/06/2020     4. Aortic atherosclerosis ([place]) Has had several telephone visits with cardiology in the last year, but has not been seen in face to face visit.  5. Gastroesophageal reflux disease without esophagitis Is on prontonix daily and is doing well  6. Benign prostatic hyperplasia with lower urinary tract symptoms, symptom details unspecified No urinary symptoms. Lab Results  Component Value Date   PSA1 17.5 (H) 05/06/2020   PSA1 12.8 (H) 06/26/2019   PSA1 11.8 (H) 01/28/2019      7. Iron deficiency anemia, unspecified iron deficiency anemia type Has fatigue. Just in the last couple of weeks. Lab Results  Component Value Date   HGB 13.8 05/06/2020    8. Late effect of cerebrovascular accident (CVA) No residual effects  9. Peripheral edema Elevates legs when has edema.    Outpatient Encounter Medications as of 08/09/2020  Medication Sig   albuterol (VENTOLIN HFA) 108 (90 Base) MCG/ACT inhaler TAKE 2 PUFFS BY MOUTH EVERY 6 HOURS AS NEEDED FOR WHEEZE OR SHORTNESS OF BREATH   aspirin 81 MG chewable tablet Chew 81 mg by  mouth daily.   atorvastatin (LIPITOR) 40 MG tablet Take 1 tablet (40 mg total) by mouth daily.   blood glucose meter kit and supplies KIT Dispense based on patient and insurance preference. Use up to four times daily as directed. (FOR ICD-9 250.00, 250.01).   cholecalciferol (VITAMIN D3) 25 MCG (1000 UNIT) tablet Take 1 tablet (1,000 Units total) by mouth daily.   clopidogrel (PLAVIX) 75 MG tablet Take 1 tablet (75 mg total) by mouth daily.   escitalopram (LEXAPRO) 20 MG tablet Take 1 tablet (20 mg total) by mouth daily.   glucose blood (ACCU-CHEK GUIDE) test strip Use as instructed   iron polysaccharides (NIFEREX) 150 MG capsule TAKE 1 CAPSULE BY MOUTH EVERY DAY   olmesartan (BENICAR) 40 MG tablet Take 1 tablet (40 mg total) by mouth daily.   pantoprazole (PROTONIX) 40 MG tablet Take 1 tablet (40 mg total) by mouth 2 (two) times daily.   sucralfate (CARAFATE) 1 g tablet TAKE 1 TABLET 4 TIMES A DAY WITH MEALS & AT BEDTIME   traMADol (ULTRAM) 50 MG tablet    No facility-administered encounter medications on file as of 08/09/2020.    Past Surgical History:  Procedure Laterality Date   collapsed lung     HERNIA REPAIR     PROSTATE SURGERY      Family History  Problem Relation Age of Onset   Lung cancer Father    Prostate cancer  Father    Stomach cancer Maternal Grandmother    Prostate cancer Paternal Uncle    Cervical cancer Daughter    Psoriasis Daughter    Arthritis Daughter        psoriatic arthritis    New complaints: None today  Social history: Lives by hisself and his daughters check on him daily.  Controlled substance contract: n/a     Review of Systems  Constitutional:  Negative for diaphoresis.  Eyes:  Negative for pain.  Respiratory:  Negative for shortness of breath.   Cardiovascular:  Negative for chest pain, palpitations and leg swelling.  Gastrointestinal:  Negative for abdominal pain.  Endocrine: Negative for polydipsia.  Skin:  Negative for rash.   Neurological:  Negative for dizziness, weakness and headaches.  Hematological:  Does not bruise/bleed easily.  All other systems reviewed and are negative.     Objective:   Physical Exam Vitals and nursing note reviewed.  Constitutional:      Appearance: Normal appearance. He is well-developed.  HENT:     Head: Normocephalic.     Nose: Nose normal.  Eyes:     Pupils: Pupils are equal, round, and reactive to light.  Neck:     Thyroid: No thyroid mass or thyromegaly.     Vascular: No carotid bruit or JVD.     Trachea: Phonation normal.  Cardiovascular:     Rate and Rhythm: Normal rate and regular rhythm.  Pulmonary:     Effort: Pulmonary effort is normal. No respiratory distress.     Breath sounds: Normal breath sounds.  Abdominal:     General: Bowel sounds are normal.     Palpations: Abdomen is soft.     Tenderness: There is no abdominal tenderness.  Musculoskeletal:        General: Normal range of motion.     Cervical back: Normal range of motion and neck supple.  Lymphadenopathy:     Cervical: No cervical adenopathy.  Skin:    General: Skin is warm and dry.  Neurological:     Mental Status: He is alert and oriented to person, place, and time.  Psychiatric:        Behavior: Behavior normal.        Thought Content: Thought content normal.        Judgment: Judgment normal.    Hgba1c discussed at appointment 6.9% BP (!) 165/80   Pulse 63   Temp 97.7 F (36.5 C) (Temporal)   Ht 5' 7" (1.702 m)   Wt 176 lb 3.2 oz (79.9 kg)   SpO2 94%   BMI 27.60 kg/m         Assessment & Plan:  Rodney Hernandez comes in today with chief complaint of Medical Management of Chronic Issues, Diabetes, and Hypertension   Diagnosis and orders addressed:  1. Essential hypertension Low sodium diet Added hctz 12.27m daily to meds Keep diary of blood pressure at home. - CBC with Differential/Platelet - hydrochlorothiazide (MICROZIDE) 12.5 MG capsule; Take 1 capsule (12.5 mg total) by  mouth daily.  Dispense: 90 capsule; Refill: 1 - olmesartan (BENICAR) 40 MG tablet; Take 1 tablet (40 mg total) by mouth daily.  Dispense: 90 tablet; Refill: 1  2. Hyperlipidemia with target LDL less than 100 Low fat diet - CMP14+EGFR - Lipid panel - atorvastatin (LIPITOR) 40 MG tablet; Take 1 tablet (40 mg total) by mouth daily.  Dispense: 90 tablet; Refill: 1  3. Type 2 diabetes mellitus without complication, without long-term current use of  insulin ([place]) Continue to watch carbs in diet - Bayer DCA Hb A1c Waived  4. Aortic atherosclerosis ([place]) Keep follow up appointment with cardiology  5. Gastroesophageal reflux disease without esophagitis Avoid spicy foods Do not eat 2 hours prior to bedtime - pantoprazole (PROTONIX) 40 MG tablet; Take 1 tablet (40 mg total) by mouth 2 (two) times daily.  Dispense: 180 tablet; Refill: 1  6. Diabetes mellitus without complication ([place]) See above  7. Benign prostatic hyperplasia with lower urinary tract symptoms, symptom details unspecified Labs pending  8. Iron deficiency anemia, unspecified iron deficiency anemia type Labs pending - iron polysaccharides (NIFEREX) 150 MG capsule; TAKE 1 CAPSULE BY MOUTH EVERY DAY  Dispense: 90 capsule; Refill: 1  9. Late effect of cerebrovascular accident (CVA) - clopidogrel (PLAVIX) 75 MG tablet; Take 1 tablet (75 mg total) by mouth daily.  Dispense: 90 tablet; Refill: 1  10. Peripheral edema Elevat legs when sitting  11. History of left-sided carotid endarterectomy   12. Acute embolic stroke ([place])  13. Depression, major, single episode, moderate (HCC) Stress management - escitalopram (LEXAPRO) 20 MG tablet; Take 1 tablet (20 mg total) by mouth daily.  Dispense: 90 tablet; Refill: 1   Labs pending Health Maintenance reviewed Diet and exercise encouraged  Follow up plan: 3 months   Mary-Margaret Hassell Done, FNP

## 2020-08-09 NOTE — Patient Instructions (Signed)
https://www.nhlbi.nih.gov/files/docs/public/heart/dash_brief.pdf">  DASH Eating Plan DASH stands for Dietary Approaches to Stop Hypertension. The DASH eating plan is a healthy eating plan that has been shown to: Reduce high blood pressure (hypertension). Reduce your risk for type 2 diabetes, heart disease, and stroke. Help with weight loss. What are tips for following this plan? Reading food labels Check food labels for the amount of salt (sodium) per serving. Choose foods with less than 5 percent of the Daily Value of sodium. Generally, foods with less than 300 milligrams (mg) of sodium per serving fit into this eating plan. To find whole grains, look for the word "whole" as the first word in the ingredient list. Shopping Buy products labeled as "low-sodium" or "no salt added." Buy fresh foods. Avoid canned foods and pre-made or frozen meals. Cooking Avoid adding salt when cooking. Use salt-free seasonings or herbs instead of table salt or sea salt. Check with your health care provider or pharmacist before using salt substitutes. Do not fry foods. Cook foods using healthy methods such as baking, boiling, grilling, roasting, and broiling instead. Cook with heart-healthy oils, such as olive, canola, avocado, soybean, or sunflower oil. Meal planning  Eat a balanced diet that includes: 4 or more servings of fruits and 4 or more servings of vegetables each day. Try to fill one-half of your plate with fruits and vegetables. 6-8 servings of whole grains each day. Less than 6 oz (170 g) of lean meat, poultry, or fish each day. A 3-oz (85-g) serving of meat is about the same size as a deck of cards. One egg equals 1 oz (28 g). 2-3 servings of low-fat dairy each day. One serving is 1 cup (237 mL). 1 serving of nuts, seeds, or beans 5 times each week. 2-3 servings of heart-healthy fats. Healthy fats called omega-3 fatty acids are found in foods such as walnuts, flaxseeds, fortified milks, and eggs.  These fats are also found in cold-water fish, such as sardines, salmon, and mackerel. Limit how much you eat of: Canned or prepackaged foods. Food that is high in trans fat, such as some fried foods. Food that is high in saturated fat, such as fatty meat. Desserts and other sweets, sugary drinks, and other foods with added sugar. Full-fat dairy products. Do not salt foods before eating. Do not eat more than 4 egg yolks a week. Try to eat at least 2 vegetarian meals a week. Eat more home-cooked food and less restaurant, buffet, and fast food.  Lifestyle When eating at a restaurant, ask that your food be prepared with less salt or no salt, if possible. If you drink alcohol: Limit how much you use to: 0-1 drink a day for women who are not pregnant. 0-2 drinks a day for men. Be aware of how much alcohol is in your drink. In the [place] one drink equals one 12 oz bottle of beer (355 mL), one 5 oz glass of wine (148 mL), or one 1 oz glass of hard liquor (44 mL). General information Avoid eating more than 2,300 mg of salt a day. If you have hypertension, you may need to reduce your sodium intake to 1,500 mg a day. Work with your health care provider to maintain a healthy body weight or to lose weight. Ask what an ideal weight is for you. Get at least 30 minutes of exercise that causes your heart to beat faster (aerobic exercise) most days of the week. Activities may include walking, swimming, or biking. Work with your health care provider   or dietitian to adjust your eating plan to your individual calorie needs. What foods should I eat? Fruits All fresh, dried, or frozen fruit. Canned fruit in natural juice (without addedsugar). Vegetables Fresh or frozen vegetables (raw, steamed, roasted, or grilled). Low-sodium or reduced-sodium tomato and vegetable juice. Low-sodium or reduced-sodium tomatosauce and tomato paste. Low-sodium or reduced-sodium canned vegetables. Grains Whole-grain or  whole-wheat bread. Whole-grain or whole-wheat pasta. Brown rice. Oatmeal. Quinoa. Bulgur. Whole-grain and low-sodium cereals. Pita bread.Low-fat, low-sodium crackers. Whole-wheat flour tortillas. Meats and other proteins Skinless chicken or turkey. Ground chicken or turkey. Pork with fat trimmed off. Fish and seafood. Egg whites. Dried beans, peas, or lentils. Unsalted nuts, nut butters, and seeds. Unsalted canned beans. Lean cuts of beef with fat trimmed off. Low-sodium, lean precooked or cured meat, such as sausages or meatloaves. Dairy Low-fat (1%) or fat-free (skim) milk. Reduced-fat, low-fat, or fat-free cheeses. Nonfat, low-sodium ricotta or cottage cheese. Low-fat or nonfatyogurt. Low-fat, low-sodium cheese. Fats and oils Soft margarine without trans fats. Vegetable oil. Reduced-fat, low-fat, or light mayonnaise and salad dressings (reduced-sodium). Canola, safflower, olive, avocado, soybean, andsunflower oils. Avocado. Seasonings and condiments Herbs. Spices. Seasoning mixes without salt. Other foods Unsalted popcorn and pretzels. Fat-free sweets. The items listed above may not be a complete list of foods and beverages you can eat. Contact a dietitian for more information. What foods should I avoid? Fruits Canned fruit in a light or heavy syrup. Fried fruit. Fruit in cream or buttersauce. Vegetables Creamed or fried vegetables. Vegetables in a cheese sauce. Regular canned vegetables (not low-sodium or reduced-sodium). Regular canned tomato sauce and paste (not low-sodium or reduced-sodium). Regular tomato and vegetable juice(not low-sodium or reduced-sodium). Pickles. Olives. Grains Baked goods made with fat, such as croissants, muffins, or some breads. Drypasta or rice meal packs. Meats and other proteins Fatty cuts of meat. Ribs. Fried meat. Bacon. Bologna, salami, and other precooked or cured meats, such as sausages or meat loaves. Fat from the back of a pig (fatback). Bratwurst.  Salted nuts and seeds. Canned beans with added salt. Canned orsmoked fish. Whole eggs or egg yolks. Chicken or turkey with skin. Dairy Whole or 2% milk, cream, and half-and-half. Whole or full-fat cream cheese. Whole-fat or sweetened yogurt. Full-fat cheese. Nondairy creamers. Whippedtoppings. Processed cheese and cheese spreads. Fats and oils Butter. Stick margarine. Lard. Shortening. Ghee. Bacon fat. Tropical oils, suchas coconut, palm kernel, or palm oil. Seasonings and condiments Onion salt, garlic salt, seasoned salt, table salt, and sea salt. Worcestershire sauce. Tartar sauce. Barbecue sauce. Teriyaki sauce. Soy sauce, including reduced-sodium. Steak sauce. Canned and packaged gravies. Fish sauce. Oyster sauce. Cocktail sauce. Store-bought horseradish. Ketchup. Mustard. Meat flavorings and tenderizers. Bouillon cubes. Hot sauces. Pre-made or packaged marinades. Pre-made or packaged taco seasonings. Relishes. Regular saladdressings. Other foods Salted popcorn and pretzels. The items listed above may not be a complete list of foods and beverages you should avoid. Contact a dietitian for more information. Where to find more information National Heart, Lung, and Blood Institute: www.nhlbi.nih.gov American Heart Association: www.heart.org Academy of Nutrition and Dietetics: www.eatright.org National Kidney Foundation: www.kidney.org Summary The DASH eating plan is a healthy eating plan that has been shown to reduce high blood pressure (hypertension). It may also reduce your risk for type 2 diabetes, heart disease, and stroke. When on the DASH eating plan, aim to eat more fresh fruits and vegetables, whole grains, lean proteins, low-fat dairy, and heart-healthy fats. With the DASH eating plan, you should limit salt (sodium) intake to 2,300   mg a day. If you have hypertension, you may need to reduce your sodium intake to 1,500 mg a day. Work with your health care provider or dietitian to adjust  your eating plan to your individual calorie needs. This information is not intended to replace advice given to you by your health care provider. Make sure you discuss any questions you have with your healthcare provider. Document Revised: 11/29/2018 Document Reviewed: 11/29/2018 Elsevier Patient Education  2022 Elsevier Inc.  

## 2020-08-10 LAB — CBC WITH DIFFERENTIAL/PLATELET
Basophils Absolute: 0.1 10*3/uL (ref 0.0–0.2)
Basos: 1 %
EOS (ABSOLUTE): 0.2 10*3/uL (ref 0.0–0.4)
Eos: 3 %
Hematocrit: 43.7 % (ref 37.5–51.0)
Hemoglobin: 13.3 g/dL (ref 13.0–17.7)
Immature Grans (Abs): 0.1 10*3/uL (ref 0.0–0.1)
Immature Granulocytes: 1 %
Lymphocytes Absolute: 1.7 10*3/uL (ref 0.7–3.1)
Lymphs: 21 %
MCH: 24.3 pg — ABNORMAL LOW (ref 26.6–33.0)
MCHC: 30.4 g/dL — ABNORMAL LOW (ref 31.5–35.7)
MCV: 80 fL (ref 79–97)
Monocytes Absolute: 0.9 10*3/uL (ref 0.1–0.9)
Monocytes: 11 %
Neutrophils Absolute: 5.3 10*3/uL (ref 1.4–7.0)
Neutrophils: 63 %
Platelets: 306 10*3/uL (ref 150–450)
RBC: 5.48 x10E6/uL (ref 4.14–5.80)
RDW: 15.7 % — ABNORMAL HIGH (ref 11.6–15.4)
WBC: 8.3 10*3/uL (ref 3.4–10.8)

## 2020-08-10 LAB — CMP14+EGFR
ALT: 17 IU/L (ref 0–44)
AST: 18 IU/L (ref 0–40)
Albumin/Globulin Ratio: 2.1 (ref 1.2–2.2)
Albumin: 4.4 g/dL (ref 3.7–4.7)
Alkaline Phosphatase: 109 IU/L (ref 44–121)
BUN/Creatinine Ratio: 18 (ref 10–24)
BUN: 16 mg/dL (ref 8–27)
Bilirubin Total: 0.4 mg/dL (ref 0.0–1.2)
CO2: 27 mmol/L (ref 20–29)
Calcium: 10.6 mg/dL — ABNORMAL HIGH (ref 8.6–10.2)
Chloride: 101 mmol/L (ref 96–106)
Creatinine, Ser: 0.91 mg/dL (ref 0.76–1.27)
Globulin, Total: 2.1 g/dL (ref 1.5–4.5)
Glucose: 98 mg/dL (ref 65–99)
Potassium: 4.5 mmol/L (ref 3.5–5.2)
Sodium: 139 mmol/L (ref 134–144)
Total Protein: 6.5 g/dL (ref 6.0–8.5)
eGFR: 87 mL/min/{1.73_m2} (ref >59)

## 2020-08-10 LAB — LIPID PANEL
Chol/HDL Ratio: 2.3 ratio (ref 0.0–5.0)
Cholesterol, Total: 97 mg/dL — ABNORMAL LOW (ref 100–199)
HDL: 43 mg/dL (ref >39)
LDL Chol Calc (NIH): 41 mg/dL (ref 0–99)
Triglycerides: 57 mg/dL (ref 0–149)
VLDL Cholesterol Cal: 13 mg/dL (ref 5–40)

## 2020-08-11 ENCOUNTER — Telehealth: Payer: Self-pay | Admitting: Nurse Practitioner

## 2020-08-11 DIAGNOSIS — R0609 Other forms of dyspnea: Secondary | ICD-10-CM

## 2020-08-11 DIAGNOSIS — R0600 Dyspnea, unspecified: Secondary | ICD-10-CM

## 2020-08-11 LAB — SPECIMEN STATUS REPORT

## 2020-08-11 MED ORDER — ALBUTEROL SULFATE HFA 108 (90 BASE) MCG/ACT IN AERS: INHALATION_SPRAY | RESPIRATORY_TRACT | 1 refills | Status: DC

## 2020-08-11 NOTE — Telephone Encounter (Signed)
Medication has been sent patient aware.

## 2020-08-11 NOTE — Telephone Encounter (Signed)
  Prescription Request  08/11/2020  What is the name of the medication or equipment? albuterol (VENTOLIN HFA) 108 (90 Base) MCG/ACT inhaler   Have you contacted your pharmacy to request a refill? (if applicable) yes  Which pharmacy would you like this sent to? [facility]   Patient notified that their request is being sent to the clinical staff for review and that they should receive a response within 2 business days.

## 2020-08-20 ENCOUNTER — Telehealth: Payer: Self-pay | Admitting: Nurse Practitioner

## 2020-08-20 NOTE — Telephone Encounter (Signed)
Please review

## 2020-08-20 NOTE — Telephone Encounter (Signed)
Daughter called  Pt almost passed out yesterday. His bp was 104/58. Daughter stopped the hydrochlorothiazide (MICROZIDE) 12.5 MG capsule today. She thinks the fluid pill is not helping.  Pt bp this morning was 157/79 pulse 54.  Daughter asked for a call back from [facility] or nurse  Daughter says pt needs Refill on blood sugar strips and lancets--use [employer].

## 2020-08-20 NOTE — Telephone Encounter (Signed)
Try half tablet at least because blood pressure is to high

## 2020-08-23 ENCOUNTER — Telehealth: Payer: Self-pay | Admitting: Nurse Practitioner

## 2020-08-23 DIAGNOSIS — R739 Hyperglycemia, unspecified: Secondary | ICD-10-CM

## 2020-08-23 MED ORDER — ACCU-CHEK SOFTCLIX LANCETS MISC: 12 refills | Status: AC

## 2020-08-23 MED ORDER — AMLODIPINE BESYLATE 2.5 MG PO TABS: 2.5000 mg | ORAL_TABLET | Freq: Every day | ORAL | 0 refills | Status: DC

## 2020-08-23 MED ORDER — ACCU-CHEK GUIDE VI STRP: ORAL_STRIP | 12 refills | Status: AC

## 2020-08-23 NOTE — Telephone Encounter (Signed)
He is on the max dose of benicar ( olemsartan). HCTZ doe snot cme in lower dose. Going to try amlodipine 2.'5mg'$  1 daily- keepo diary of blood pressure and ley me know how he s doing.

## 2020-08-23 NOTE — Telephone Encounter (Signed)
Spoke with daughter and they are unable to 1/2 the fluid pill because its a capsule. Wants to know if they can D/C the HCTZ all together and increase his olmesartan? Please advise

## 2020-08-23 NOTE — Telephone Encounter (Signed)
  Prescription Request  08/23/2020  What is the name of the medication or equipment? Refill on blood sugar strips and lancets  Have you contacted your pharmacy to request a refill? (if applicable) yes   Which pharmacy would you like this sent to? [facility]    Patient notified that their request is being sent to the clinical staff for review and that they should receive a response within 2 business days.

## 2020-08-23 NOTE — Telephone Encounter (Signed)
Patient aware and verbalized understanding. °

## 2020-08-23 NOTE — Telephone Encounter (Signed)
Pt aware refill sent to pharmacy 

## 2020-08-25 ENCOUNTER — Encounter: Payer: Self-pay | Admitting: Cardiology

## 2020-08-25 ENCOUNTER — Ambulatory Visit: Payer: Medicare HMO | Admitting: Cardiology

## 2020-08-25 ENCOUNTER — Other Ambulatory Visit: Payer: Self-pay

## 2020-08-25 VITALS — BP 130/70 | HR 57 | Ht 67.0 in | Wt 173.0 lb

## 2020-08-25 DIAGNOSIS — Z8673 Personal history of transient ischemic attack (TIA), and cerebral infarction without residual deficits: Secondary | ICD-10-CM | POA: Diagnosis not present

## 2020-08-25 DIAGNOSIS — E782 Mixed hyperlipidemia: Secondary | ICD-10-CM

## 2020-08-25 DIAGNOSIS — I10 Essential (primary) hypertension: Secondary | ICD-10-CM | POA: Diagnosis not present

## 2020-08-25 DIAGNOSIS — R0602 Shortness of breath: Secondary | ICD-10-CM

## 2020-08-25 DIAGNOSIS — J449 Chronic obstructive pulmonary disease, unspecified: Secondary | ICD-10-CM | POA: Diagnosis not present

## 2020-08-25 NOTE — Patient Instructions (Addendum)
Medication Instructions:  Continue all current medications.   Labwork: none  Testing/Procedures: none  Follow-Up: 6 months   Any Other Special Instructions Will Be Listed Below (If Applicable).   If you need a refill on your cardiac medications before your next appointment, please call your pharmacy.  

## 2020-08-25 NOTE — Progress Notes (Signed)
Clinical Summary Rodney Hernandez is a 75 y.o.male seen today for follow up of the following medical problems.   1. SOB -last visit he reported some DOE with activity, example playing golf he gets significantly SOB. Used to tolerate quite well. Exertion is also limited by chronic leg pains as well.  - denies any chest pain. No LE edema. No orthopnea. No palpitations   CAD risk factors: HTN, Hyperlipidemia, former smoker x 20. Multiple family members on father's side with heart conditions.      08/2015 echo with LVEF 62-37%, normal diastolic function, mild MR, mild RV enlargement, PASP 42 - he did not go for stress test as we had ordered 3 years ago, did not follow up   - SOB is stable since last visit. Can have some chest tightness at times, he reports significant productive cough as well.   11/2018 PFTs moderate obstruction 11/2018 echo LVEF 60-65%, no WMAs, normal RV,  11/2018 nuclear stress: no ischemia - some DOE at times    2. HTN - compliant with meds  - initially after d/c low bp's at home - just started on HCTZ 12.78m, stopped again - just started norvac  3. COPD -2020 PFTs moderate obstruction   4. Hyperlipidemia -08/09/20 TC 97 TG 57 HDL 43 LDL 41   5. 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 [facility] 7.  - CT @ OSH negative for hemorrhage. CTA head and neck brain with L MCA occlusion. - Pt candidate for IV tPA, so it was given [facility]. - Thrombectomy by Dr. HArizona Constable- L M2 embolus, TICI 3 flow post thrombectomy  - MRI 1/5 with scattered acute infarctions in the L cerebral hemisphere, tiny petechial hemorrhage in the L insula and putamen infarctions, mild leukoaraiosis, and tiny chronic R cerebellar infarction.  - 24hr CT post tPA (1/5) - negative -Patient currently on aspirin, plavix, lipitor  2) Carotid  Stenosis of L ICA  - 70% ICA stenosis  - s/p left CEA on 1/7 -Currently on ASA/Plavix      6. Prediabtes -08/09/20 6.9 Past Medical History:  Diagnosis Date   Arthritis    psoriatic arthritis and osteo arthritis   Asthma    Collapse of lung    Colon polyps    DOE (dyspnea on exertion) 12/02/2018   Eczema    GERD (gastroesophageal reflux disease)    Hypertension    Seasonal allergies    Stroke (HCC)    Vertigo      Allergies  Allergen Reactions   Nsaids Shortness Of Breath   Aspirin Other (See Comments)    Affects patient breathing   Morphine And Related Hives and Itching   Sulfa Antibiotics      Current Outpatient Medications  Medication Sig Dispense Refill   Accu-Chek Softclix Lancets lancets Test BS up to QID Dx E11.9 100 each 12   albuterol (VENTOLIN HFA) 108 (90 Base) MCG/ACT inhaler TAKE 2 PUFFS BY MOUTH EVERY 6 HOURS AS NEEDED FOR WHEEZE OR SHORTNESS OF BREATH 6.7 g 1   amLODipine (NORVASC) 2.5 MG tablet Take 1 tablet (2.5 mg total) by mouth daily. 30 tablet 0   aspirin 81 MG chewable tablet Chew 81 mg by mouth daily.     atorvastatin (LIPITOR) 40 MG tablet Take 1 tablet (40 mg total) by mouth  daily. 90 tablet 1   blood glucose meter kit and supplies KIT Dispense based on patient and insurance preference. Use up to four times daily as directed. (FOR ICD-9 250.00, 250.01). 1 each 0   cholecalciferol (VITAMIN D3) 25 MCG (1000 UNIT) tablet Take 1 tablet (1,000 Units total) by mouth daily. 90 tablet 3   clopidogrel (PLAVIX) 75 MG tablet Take 1 tablet (75 mg total) by mouth daily. 90 tablet 1   escitalopram (LEXAPRO) 20 MG tablet Take 1 tablet (20 mg total) by mouth daily. 90 tablet 1   glucose blood (ACCU-CHEK GUIDE) test strip Test BS up to QID Dx E11.9 100 each 12   hydrochlorothiazide (MICROZIDE) 12.5 MG capsule Take 1 capsule (12.5 mg total) by mouth daily. 90 capsule 1   iron polysaccharides (NIFEREX) 150 MG capsule TAKE 1 CAPSULE BY MOUTH EVERY DAY 90 capsule  1   olmesartan (BENICAR) 40 MG tablet Take 1 tablet (40 mg total) by mouth daily. 90 tablet 1   pantoprazole (PROTONIX) 40 MG tablet Take 1 tablet (40 mg total) by mouth 2 (two) times daily. 180 tablet 1   sucralfate (CARAFATE) 1 g tablet TAKE 1 TABLET 4 TIMES A DAY WITH MEALS & AT BEDTIME 120 tablet 2   traMADol (ULTRAM) 50 MG tablet Take 1 tablet (50 mg total) by mouth every 12 (twelve) hours as needed. 30 tablet 2   No current facility-administered medications for this visit.     Past Surgical History:  Procedure Laterality Date   collapsed lung     HERNIA REPAIR     PROSTATE SURGERY       Allergies  Allergen Reactions   Nsaids Shortness Of Breath   Aspirin Other (See Comments)    Affects patient breathing   Morphine And Related Hives and Itching   Sulfa Antibiotics       Family History  Problem Relation Age of Onset   Lung cancer Father    Prostate cancer Father    Stomach cancer Maternal Grandmother    Prostate cancer Paternal Uncle    Cervical cancer Daughter    Psoriasis Daughter    Arthritis Daughter        psoriatic arthritis     Social History Rodney Hernandez reports that he quit smoking about 39 years ago. His smoking use included cigarettes. He has a 44.00 pack-year smoking history. He has never used smokeless tobacco. Rodney Hernandez reports no history of alcohol use.   Review of Systems CONSTITUTIONAL: No weight loss, fever, chills, weakness or fatigue.  HEENT: Eyes: No visual loss, blurred vision, double vision or yellow sclerae.No hearing loss, sneezing, congestion, runny nose or sore throat.  SKIN: No rash or itching.  CARDIOVASCULAR: per hpi RESPIRATORY: No shortness of breath, cough or sputum.  GASTROINTESTINAL: No anorexia, nausea, vomiting or diarrhea. No abdominal pain or blood.  GENITOURINARY: No burning on urination, no polyuria NEUROLOGICAL: No headache, dizziness, syncope, paralysis, ataxia, numbness or tingling in the extremities. No change in  bowel or bladder control.  MUSCULOSKELETAL: No muscle, back pain, joint pain or stiffness.  LYMPHATICS: No enlarged nodes. No history of splenectomy.  PSYCHIATRIC: No history of depression or anxiety.  ENDOCRINOLOGIC: No reports of sweating, cold or heat intolerance. No polyuria or polydipsia.  Marland Kitchen   Physical Examination Today's Vitals   08/25/20 0817  BP: 130/70  Pulse: (!) 57  SpO2: 95%  Weight: 173 lb (78.5 kg)  Height: _0  (1.702 m)   Body mass index is 27.1 kg/m.  Gen: resting comfortably, no acute distress HEENT: no scleral icterus, pupils equal round and reactive, no palptable cervical adenopathy,  CV: RRR, no m/r/g, no jvd Resp: Clear to auscultation bilaterally GI: abdomen is soft, non-tender, non-distended, normal bowel sounds, no hepatosplenomegaly MSK: extremities are warm, no edema.  Skin: warm, no rash Neuro:  no focal deficits Psych: appropriate affect   Diagnostic Studies  08/2015 echo Study Conclusions   - Left ventricle: The cavity size was normal. Wall thickness was   normal. Systolic function was normal. The estimated ejection   fraction was in the range of 55% to 60%. Wall motion was normal;   there were no regional wall motion abnormalities. Left   ventricular diastolic function parameters were normal for the   patient&'s age. - Aortic valve: Mildly calcified annulus. Trileaflet. There was   trivial regurgitation. - Mitral valve: Calcified annulus. There was mild regurgitation. - Right ventricle: The cavity size was mildly dilated. - Right atrium: The atrium was mildly dilated. Central venous   pressure (est): 3 mm Hg. - Tricuspid valve: There was mild regurgitation. - Pulmonary arteries: PA peak pressure: 42 mm Hg (S). - Pericardium, extracardiac: There was no pericardial effusion.   Impressions:   - Normal LV wall thickness with LVEF 55-60%. Grossly normal   diastolic function. Mild MAC with mild mitral regurgitation.   Mildly sclerotic  aortic annulus with trivial aortic   regurgitation. Mild RV enlargement with normal contraction. Mild   tricuspid regurgitation with elevated PASP 42 mmHg.   11/2018 PFTs Moderate obstructive disease  11/2018 echo IMPRESSIONS     1. Left ventricular ejection fraction, by visual estimation, is 60 to  65%. The left ventricle has normal function. There is no left ventricular  hypertrophy.   2. The left ventricle has no regional wall motion abnormalities.   3. Global right ventricle has normal systolic function.The right  ventricular size is normal. No increase in right ventricular wall  thickness.   4. Left atrial size was normal.   5. Right atrial size was normal.   6. The mitral valve is normal in structure. No evidence of mitral valve  regurgitation. No evidence of mitral stenosis.   7. The tricuspid valve is normal in structure. Tricuspid valve  regurgitation is not demonstrated.   8. The aortic valve is normal in structure. Aortic valve regurgitation is  not visualized. No evidence of aortic valve sclerosis or stenosis.   9. The pulmonic valve was normal in structure. Pulmonic valve  regurgitation is not visualized.  10. The inferior vena cava is normal in size with greater than 50%  respiratory variability, suggesting right atrial pressure of 3 mmHg.   Jan 2022  Left Ventricle  Normal left ventricular size. Wall thickness is normal. Systolic function is normal. EF: 60-65%. Wall motion is normal. Doppler parameters consistent with mild diastolic dysfunction and low to normal LA pressure.   Right Ventricle  Right ventricle is normal. Systolic function is normal.   Left Atrium  Left atrium is mildly dilated.   Right Atrium  Normal sized right atrium.   IVC/SVC  The inferior vena cava demonstrates a diameter of <=2.1 cm and collapses >50%; therefore, the right atrial pressure is estimated at 3 mmHg.   Mitral Valve  Mitral valve structure is normal. There is trace  regurgitation.   Tricuspid Valve  Tricuspid valve structure is normal. There is trace regurgitation. The right ventricular systolic pressure is normal (<36 mmHg).   Aortic Valve  The aortic  valve is tricuspid. The leaflets are not thickened and exhibit normal excursion. Trace regurgitation. There is no evidence of aortic valve stenosis.   Pulmonic Valve  The pulmonic valve was not well visualized. Trace regurgitation.   Ascending Aorta  The aortic root is normal in size.   Pericardium  There is no pericardial effusion.  Assessment and Plan  1. SOB - symptoms improving, benign extensive cardiac workup over the last few years including echo and stress test. Only abnormality was PFTs showing moderate obstruction which would appear to be the etiology of symptoms, management per pcp   2. HTN - reports low bp's on HCTZ, now off. Just started on norvac 2.8m daily to see if better tolerates as bp's have gone back up  3. Hyperlipidemia - at goal, continue statin  4. History of CVA - s/p left CEA, the intended duration of his asa/plavix is unclear. Will see if we can get in touch with his vacular physician, continue for now.      Rodney Hernandez M.D.

## 2020-08-31 DIAGNOSIS — L570 Actinic keratosis: Secondary | ICD-10-CM | POA: Diagnosis not present

## 2020-09-16 ENCOUNTER — Other Ambulatory Visit: Payer: Self-pay | Admitting: Nurse Practitioner

## 2020-09-16 NOTE — Telephone Encounter (Signed)
Pt has question about his new medication norvasc

## 2020-09-16 NOTE — Telephone Encounter (Signed)
Since adding Amlodipine 2.5 mg Pt's BP has been 160-170/70-80s he has not had any low numbers since Can BP med be increased, or can he take this at night instead

## 2020-09-20 NOTE — Telephone Encounter (Signed)
Increase to 2 tablets at sametime daily and let me know if blood pressure improves.

## 2020-09-24 ENCOUNTER — Other Ambulatory Visit: Payer: Self-pay | Admitting: Nurse Practitioner

## 2020-09-29 ENCOUNTER — Telehealth: Payer: Self-pay | Admitting: Nurse Practitioner

## 2020-09-29 DIAGNOSIS — N401 Enlarged prostate with lower urinary tract symptoms: Secondary | ICD-10-CM

## 2020-09-29 NOTE — Telephone Encounter (Signed)
Pt needs a PSA test done for his urologist. He has an appt with them on 10/6 and they would like for him to bring in a paper copy of the results when he comes. Please call the pt once the order is put in.

## 2020-09-29 NOTE — Telephone Encounter (Signed)
Order placed, patient informed

## 2020-10-01 ENCOUNTER — Other Ambulatory Visit: Payer: Self-pay

## 2020-10-01 ENCOUNTER — Other Ambulatory Visit: Payer: Medicare HMO

## 2020-10-01 ENCOUNTER — Telehealth: Payer: Self-pay | Admitting: *Deleted

## 2020-10-01 DIAGNOSIS — N401 Enlarged prostate with lower urinary tract symptoms: Secondary | ICD-10-CM

## 2020-10-01 NOTE — Telephone Encounter (Signed)
Patient came into clinic for labs today.  He had a high blood pressure reading at home this morning 198/98.  He asked for a check in the nurse triage station. It came back at 203/67.  Patient states he feels fine otherwise. Just worried about the high number.  He says he took his B/P meds at noon today.  He wanted to go back home.  I advised that he call EMS at the first sign that something doesn't feel right.

## 2020-10-02 LAB — PSA, TOTAL AND FREE
PSA, Free Pct: 12.6 %
PSA, Free: 2.66 ng/mL
Prostate Specific Ag, Serum: 21.1 ng/mL — ABNORMAL HIGH (ref 0.0–4.0)

## 2020-10-04 ENCOUNTER — Other Ambulatory Visit: Payer: Self-pay | Admitting: Nurse Practitioner

## 2020-10-04 ENCOUNTER — Telehealth: Payer: Self-pay | Admitting: Nurse Practitioner

## 2020-10-04 NOTE — Telephone Encounter (Signed)
Pts daughter called this morning to see if MMM received her message that Yvone Neu sent to [facility] on Friday. Daughter says she gave pt 1 tablet of Lisinopril at 9AM and another one at 9PM over the weekend and says it brought his BP down to 169/75.Marland Kitchen Needs advise on what MMM wants pt to do.    Cindy Hazy, CMA     2:25 PM Note Patient came into clinic for labs today.  He had a high blood pressure reading at home this morning 198/98.  He asked for a check in the nurse triage station. It came back at 203/67.  Patient states he feels fine otherwise. Just worried about the high number.  He says he took his B/P meds at noon today.  He wanted to go back home.  I advised that he call EMS at the first sign that something doesn't feel right.

## 2020-10-04 NOTE — Telephone Encounter (Signed)
Andrius Andrepont talked to patients daughter.

## 2020-10-04 NOTE — Telephone Encounter (Signed)
Confusion about blood pressure meds. He is suppose to be on benicar 40mg  daily but he had stopped it. We stopped HCTZ and changed to norvasc due to blood pressure dropping to low. He has only been taking norvasc. Blood pressure has been out the roof. Had long discussion with his daughter. He is suppose to be on benicar 40mg  1 daily and norvasc 2.5mg  1 daily. Keep diary of blood pressure and let me know how he is doing.

## 2020-10-11 ENCOUNTER — Telehealth: Payer: Self-pay

## 2020-10-11 NOTE — Telephone Encounter (Signed)
Double up on norvasc and let me know how that works. Continue benicar 86m. 137 blood sugar is ok.

## 2020-10-11 NOTE — Telephone Encounter (Signed)
Daughter called and stated that dad is taking Benicar 40mg  at night time and Norvasc in the AM. Daughter says that BP is still running high around 162/83. He is going to see urology Thursday about his elevated PSA and also his BS are a little high. It was 132 yesterday morning. What should they do about the BP? Would the PSA being elevated have anything to do with that?

## 2020-10-11 NOTE — Telephone Encounter (Signed)
Patients daughter notified and verbalized understanding 

## 2020-10-13 ENCOUNTER — Telehealth: Payer: Self-pay

## 2020-10-13 MED ORDER — AMLODIPINE BESYLATE 5 MG PO TABS: 5.0000 mg | ORAL_TABLET | Freq: Every day | ORAL | 1 refills | Status: DC

## 2020-10-13 NOTE — Telephone Encounter (Signed)
Per Stu at [facility], patient is there and is stating Shelah Lewandowsky was going to send in a prescription for Amlodipine 5 mg.  I spoke with Shelah Lewandowsky and she said patient was to double up on the 2.5 mg tablets he has but it is okay to go ahead and send in the 5 mg.  Prescription sent to pharmacy, Stu and patient aware.

## 2020-10-14 DIAGNOSIS — I10 Essential (primary) hypertension: Secondary | ICD-10-CM | POA: Diagnosis not present

## 2020-10-14 DIAGNOSIS — R972 Elevated prostate specific antigen [PSA]: Secondary | ICD-10-CM | POA: Diagnosis not present

## 2020-10-14 DIAGNOSIS — N400 Enlarged prostate without lower urinary tract symptoms: Secondary | ICD-10-CM | POA: Diagnosis not present

## 2020-10-19 ENCOUNTER — Other Ambulatory Visit: Payer: Self-pay | Admitting: Nurse Practitioner

## 2020-10-21 ENCOUNTER — Ambulatory Visit (INDEPENDENT_AMBULATORY_CARE_PROVIDER_SITE_OTHER): Payer: Medicare HMO

## 2020-10-21 ENCOUNTER — Other Ambulatory Visit: Payer: Self-pay

## 2020-10-21 DIAGNOSIS — Z23 Encounter for immunization: Secondary | ICD-10-CM | POA: Diagnosis not present

## 2020-10-28 DIAGNOSIS — C61 Malignant neoplasm of prostate: Secondary | ICD-10-CM | POA: Diagnosis not present

## 2020-10-28 DIAGNOSIS — R972 Elevated prostate specific antigen [PSA]: Secondary | ICD-10-CM | POA: Diagnosis not present

## 2020-11-09 ENCOUNTER — Encounter: Payer: Self-pay | Admitting: Nurse Practitioner

## 2020-11-09 ENCOUNTER — Other Ambulatory Visit: Payer: Self-pay

## 2020-11-09 ENCOUNTER — Ambulatory Visit (INDEPENDENT_AMBULATORY_CARE_PROVIDER_SITE_OTHER): Payer: Medicare HMO | Admitting: Nurse Practitioner

## 2020-11-09 VITALS — BP 119/63 | HR 64 | Temp 98.7°F | Resp 20 | Ht 67.0 in | Wt 174.0 lb

## 2020-11-09 DIAGNOSIS — D509 Iron deficiency anemia, unspecified: Secondary | ICD-10-CM | POA: Diagnosis not present

## 2020-11-09 DIAGNOSIS — I10 Essential (primary) hypertension: Secondary | ICD-10-CM

## 2020-11-09 DIAGNOSIS — E119 Type 2 diabetes mellitus without complications: Secondary | ICD-10-CM

## 2020-11-09 DIAGNOSIS — I6930 Unspecified sequelae of cerebral infarction: Secondary | ICD-10-CM

## 2020-11-09 DIAGNOSIS — R609 Edema, unspecified: Secondary | ICD-10-CM

## 2020-11-09 DIAGNOSIS — I700 Atherosclerosis of aorta: Secondary | ICD-10-CM | POA: Diagnosis not present

## 2020-11-09 DIAGNOSIS — E559 Vitamin D deficiency, unspecified: Secondary | ICD-10-CM

## 2020-11-09 DIAGNOSIS — I63239 Cerebral infarction due to unspecified occlusion or stenosis of unspecified carotid arteries: Secondary | ICD-10-CM

## 2020-11-09 DIAGNOSIS — K219 Gastro-esophageal reflux disease without esophagitis: Secondary | ICD-10-CM | POA: Diagnosis not present

## 2020-11-09 DIAGNOSIS — E785 Hyperlipidemia, unspecified: Secondary | ICD-10-CM | POA: Diagnosis not present

## 2020-11-09 DIAGNOSIS — N401 Enlarged prostate with lower urinary tract symptoms: Secondary | ICD-10-CM

## 2020-11-09 LAB — BAYER DCA HB A1C WAIVED: HB A1C (BAYER DCA - WAIVED): 6.6 % — ABNORMAL HIGH (ref 4.8–5.6)

## 2020-11-09 MED ORDER — TRAMADOL HCL 50 MG PO TABS: 50.0000 mg | ORAL_TABLET | Freq: Two times a day (BID) | ORAL | 2 refills | Status: DC | PRN

## 2020-11-09 NOTE — Addendum Note (Signed)
Addended by: Chevis Pretty on: 11/09/2020 02:53 PM   Modules accepted: Orders

## 2020-11-09 NOTE — Progress Notes (Signed)
Subjective:    Patient ID: Rodney Hernandez, male    DOB: [birth year], 75 y.o.   MRN: [id number]   Chief Complaint: medical management of chronic issues     HPI:  1. Essential hypertension No c/o chest pain, sob or headache. Does not check blood pressure at home. We have had a difficult time trying to get blod pressure straightened out. His blood pressure is either really high or to low. We currently have him on . Currently we are holding HCTZ and he is on norvasc 2m daily along with benicar 47mdaily BP Readings from Last 3 Encounters:  08/25/20 130/70  08/09/20 (!) 165/80  05/06/20 (!) 142/69     2. [place] He saw cardiology on 08/25/20. Reviewing offic enote , no change was made to plan of care.  3. Carotid stenosis, symptomatic, with [facility] this [facility] and was good.  4. Gastroesophageal reflux disease without esophagitis Is on protonix daily and that works well most of the time  5. Diabetes mellitus without complication (HCC) Fasting blood sugars are going up and down. Lab Results  Component Value Date   HGBA1C 6.9 08/09/2020     6. Benign prostatic hyperplasia with lower urinary tract symptoms, symptom details unspecified No problems voiding. Had follow up with urology. Thad prostate bx and they are planning in starting him on lupron shots. Family is concerned about how this will affect his blood sugars. Lab Results  Component Value Date   PSA1 21.1 (H) 10/01/2020   PSA1 17.5 (H) 05/06/2020   PSA1 12.8 (H) 06/26/2019      7. Iron deficiency anemia, unspecified iron deficiency anemia type No c/o fatigue. Lab Results  Component Value Date   HGB 13.3 08/09/2020     8. Hyperlipidemia with target LDL less than 100 Does try to watch diet. Stays as active as he can. Lab Results  Component Value Date   CHOL 97 (L) 08/09/2020   HDL 43 08/09/2020   LDLCALC 41 08/09/2020   TRIG  57 08/09/2020   CHOLHDL 2.3 08/09/2020     9. Late effect of cerebrovascular accident (CVA) No left over effects. Is still on plavix daily  10. Peripheral edema Has some edema on occasion    Outpatient Encounter Medications as of 11/09/2020  Medication Sig   Accu-Chek Softclix Lancets lancets Test BS up to QID Dx E11.9   albuterol (VENTOLIN HFA) 108 (90 Base) MCG/ACT inhaler TAKE 2 PUFFS BY MOUTH EVERY 6 HOURS AS NEEDED FOR WHEEZE OR SHORTNESS OF BREATH   amLODipine (NORVASC) 5 MG tablet Take 1 tablet (5 mg total) by mouth daily.   aspirin 81 MG chewable tablet Chew 81 mg by mouth daily.   atorvastatin (LIPITOR) 40 MG tablet Take 1 tablet (40 mg total) by mouth daily.   blood glucose meter kit and supplies KIT Dispense based on patient and insurance preference. Use up to four times daily as directed. (FOR ICD-9 250.00, 250.01).   cholecalciferol (VITAMIN D3) 25 MCG (1000 UNIT) tablet Take 1 tablet (1,000 Units total) by mouth daily.   clopidogrel (PLAVIX) 75 MG tablet Take 1 tablet (75 mg total) by mouth daily.   escitalopram (LEXAPRO) 20 MG tablet Take 1 tablet (20 mg total) by mouth daily.   glucose blood (ACCU-CHEK GUIDE) test strip Test BS up to QID Dx E11.9   iron polysaccharides (NIFEREX) 150 MG capsule TAKE 1 CAPSULE BY MOUTH EVERY DAY  olmesartan (BENICAR) 40 MG tablet Take 1 tablet (40 mg total) by mouth daily.   pantoprazole (PROTONIX) 40 MG tablet Take 1 tablet (40 mg total) by mouth 2 (two) times daily.   sucralfate (CARAFATE) 1 g tablet TAKE ONE TABLET FOUR TIMES DAILY BEFORE MEALS AND AT BEDTIME   traMADol (ULTRAM) 50 MG tablet Take 1 tablet (50 mg total) by mouth every 12 (twelve) hours as needed.   No facility-administered encounter medications on file as of 11/09/2020.    Past Surgical History:  Procedure Laterality Date   collapsed lung     HERNIA REPAIR     PROSTATE SURGERY      Family History  Problem Relation Age of Onset   Lung cancer Father     Prostate cancer Father    Stomach cancer Maternal Grandmother    Prostate cancer Paternal Uncle    Cervical cancer Daughter    Psoriasis Daughter    Arthritis Daughter        psoriatic arthritis    New complaints: None today  Social history: Lives by hisself. His daughters check on him daily.  Controlled substance contract: n/a     Review of Systems  Constitutional:  Negative for diaphoresis.  Eyes:  Negative for pain.  Respiratory:  Negative for shortness of breath.   Cardiovascular:  Negative for chest pain, palpitations and leg swelling.  Gastrointestinal:  Negative for abdominal pain.  Endocrine: Negative for polydipsia.  Skin:  Negative for rash.  Neurological:  Negative for dizziness, weakness and headaches.  Hematological:  Does not bruise/bleed easily.  All other systems reviewed and are negative.     Objective:   Physical Exam Vitals and nursing note reviewed.  Constitutional:      Appearance: Normal appearance. He is well-developed.  HENT:     Head: Normocephalic.     Nose: Nose normal.  Eyes:     Pupils: Pupils are equal, round, and reactive to light.  Neck:     Thyroid: No thyroid mass or thyromegaly.     Vascular: No carotid bruit or JVD.     Trachea: Phonation normal.  Cardiovascular:     Rate and Rhythm: Normal rate and regular rhythm.  Pulmonary:     Effort: Pulmonary effort is normal. No respiratory distress.     Breath sounds: Normal breath sounds.  Abdominal:     General: Bowel sounds are normal.     Palpations: Abdomen is soft.     Tenderness: There is no abdominal tenderness.  Musculoskeletal:        General: Normal range of motion.     Cervical back: Normal range of motion and neck supple.  Lymphadenopathy:     Cervical: No cervical adenopathy.  Skin:    General: Skin is warm and dry.  Neurological:     Mental Status: He is alert and oriented to person, place, and time.  Psychiatric:        Behavior: Behavior normal.         Thought Content: Thought content normal.        Judgment: Judgment normal.    BP 119/63   Pulse 64   Temp 98.7 F (37.1 C) (Temporal)   Resp 20   Ht _0  (1.702 m)   Wt 174 lb (78.9 kg)   SpO2 94%   BMI 27.25 kg/m   HGBA1c 6.6%      Assessment & Plan:   Moataz Tavis comes in today with chief complaint of Medical Management  of Chronic Issues   Diagnosis and orders addressed:  1. Essential hypertension Low sodium diet - CBC with Differential/Platelet - CMP14+EGFR  2. Aortic atherosclerosis ([place]) Keep follow up with cardiology  3. Carotid stenosis, symptomatic, with infarction [facility]) Report any syncopial episoded  4. Gastroesophageal reflux disease without esophagitis Avoid spicy foods Do not eat 2 hours prior to bedtime  5. Diabetes mellitus without complication ([place]) Continue to watch carbsin diet - Bayer DCA Hb A1c Waived  6. Benign prostatic hyperplasia with lower urinary tract symptoms, symptom details unspecified Keep follow up with urology Watch blood sugras closely once start lupron  7. Iron deficiency anemia, unspecified iron deficiency anemia type  8. Hyperlipidemia with target LDL less than 100 Low fat diet - Lipid panel  9. Late effect of cerebrovascular accident (CVA)  62. Peripheral edema Elevate legs when sitting  11. Vitamin D deficiency - Vitamin D, 25-hydroxy   Labs pending Health Maintenance reviewed Diet and exercise encouraged  Follow up plan: 3 months   Mary-Margaret Hassell Done, FNP

## 2020-11-10 LAB — CMP14+EGFR
ALT: 16 IU/L (ref 0–44)
AST: 17 IU/L (ref 0–40)
Albumin/Globulin Ratio: 1.8 (ref 1.2–2.2)
Albumin: 4.3 g/dL (ref 3.7–4.7)
Alkaline Phosphatase: 113 IU/L (ref 44–121)
BUN/Creatinine Ratio: 16 (ref 10–24)
BUN: 16 mg/dL (ref 8–27)
Bilirubin Total: 0.2 mg/dL (ref 0.0–1.2)
CO2: 25 mmol/L (ref 20–29)
Calcium: 10.3 mg/dL — ABNORMAL HIGH (ref 8.6–10.2)
Chloride: 101 mmol/L (ref 96–106)
Creatinine, Ser: 1.02 mg/dL (ref 0.76–1.27)
Globulin, Total: 2.4 g/dL (ref 1.5–4.5)
Glucose: 113 mg/dL — ABNORMAL HIGH (ref 70–99)
Potassium: 4.5 mmol/L (ref 3.5–5.2)
Sodium: 138 mmol/L (ref 134–144)
Total Protein: 6.7 g/dL (ref 6.0–8.5)
eGFR: 76 mL/min/{1.73_m2} (ref >59)

## 2020-11-10 LAB — CBC WITH DIFFERENTIAL/PLATELET
Basophils Absolute: 0.1 10*3/uL (ref 0.0–0.2)
Basos: 1 %
EOS (ABSOLUTE): 0.2 10*3/uL (ref 0.0–0.4)
Eos: 2 %
Hematocrit: 43.9 % (ref 37.5–51.0)
Hemoglobin: 13.9 g/dL (ref 13.0–17.7)
Immature Grans (Abs): 0 10*3/uL (ref 0.0–0.1)
Immature Granulocytes: 0 %
Lymphocytes Absolute: 1.6 10*3/uL (ref 0.7–3.1)
Lymphs: 15 %
MCH: 25.4 pg — ABNORMAL LOW (ref 26.6–33.0)
MCHC: 31.7 g/dL (ref 31.5–35.7)
MCV: 80 fL (ref 79–97)
Monocytes Absolute: 1.2 10*3/uL — ABNORMAL HIGH (ref 0.1–0.9)
Monocytes: 10 %
Neutrophils Absolute: 8.1 10*3/uL — ABNORMAL HIGH (ref 1.4–7.0)
Neutrophils: 72 %
Platelets: 374 10*3/uL (ref 150–450)
RBC: 5.48 x10E6/uL (ref 4.14–5.80)
RDW: 15.3 % (ref 11.6–15.4)
WBC: 11.2 10*3/uL — ABNORMAL HIGH (ref 3.4–10.8)

## 2020-11-10 LAB — LIPID PANEL
Chol/HDL Ratio: 2.6 ratio (ref 0.0–5.0)
Cholesterol, Total: 100 mg/dL (ref 100–199)
HDL: 39 mg/dL — ABNORMAL LOW (ref >39)
LDL Chol Calc (NIH): 43 mg/dL (ref 0–99)
Triglycerides: 93 mg/dL (ref 0–149)
VLDL Cholesterol Cal: 18 mg/dL (ref 5–40)

## 2020-11-11 ENCOUNTER — Other Ambulatory Visit (HOSPITAL_COMMUNITY): Payer: Self-pay | Admitting: Urology

## 2020-11-11 DIAGNOSIS — C61 Malignant neoplasm of prostate: Secondary | ICD-10-CM

## 2020-11-11 LAB — VITAMIN D 25 HYDROXY (VIT D DEFICIENCY, FRACTURES): Vit D, 25-Hydroxy: 52.9 ng/mL (ref 30.0–100.0)

## 2020-11-11 LAB — SPECIMEN STATUS REPORT

## 2020-11-19 ENCOUNTER — Ambulatory Visit (HOSPITAL_COMMUNITY)
Admission: RE | Admit: 2020-11-19 | Discharge: 2020-11-19 | Disposition: A | Discharge status: Home / Self Care | Payer: Medicare HMO | Source: Ambulatory Visit | Attending: Urology | Admitting: Urology

## 2020-11-19 ENCOUNTER — Encounter (HOSPITAL_COMMUNITY): Payer: Self-pay

## 2020-11-19 ENCOUNTER — Encounter (HOSPITAL_COMMUNITY)
Admission: RE | Admit: 2020-11-19 | Discharge: 2020-11-19 | Disposition: A | Discharge status: Home / Self Care | Payer: Medicare HMO | Source: Ambulatory Visit | Attending: Urology | Admitting: Urology

## 2020-11-19 ENCOUNTER — Other Ambulatory Visit: Payer: Self-pay

## 2020-11-19 DIAGNOSIS — K769 Liver disease, unspecified: Secondary | ICD-10-CM | POA: Diagnosis not present

## 2020-11-19 DIAGNOSIS — C61 Malignant neoplasm of prostate: Secondary | ICD-10-CM | POA: Diagnosis not present

## 2020-11-19 HISTORY — DX: Malignant (primary) neoplasm, unspecified: C80.1

## 2020-11-19 MED ORDER — TECHNETIUM TC 99M MEDRONATE IV KIT
20.0000 | PACK | Freq: Once | INTRAVENOUS | Status: AC | PRN
  Administered 2020-11-19: 22 via INTRAVENOUS

## 2020-11-19 MED ORDER — IOHEXOL 300 MG/ML  SOLN
100.0000 mL | Freq: Once | INTRAMUSCULAR | Status: AC | PRN
  Administered 2020-11-19: 100 mL via INTRAVENOUS

## 2020-11-30 DIAGNOSIS — I10 Essential (primary) hypertension: Secondary | ICD-10-CM | POA: Diagnosis not present

## 2020-11-30 DIAGNOSIS — C61 Malignant neoplasm of prostate: Secondary | ICD-10-CM | POA: Diagnosis not present

## 2020-12-27 ENCOUNTER — Other Ambulatory Visit: Payer: Self-pay | Admitting: Nurse Practitioner

## 2021-01-06 ENCOUNTER — Encounter: Payer: Self-pay | Admitting: Nurse Practitioner

## 2021-01-06 ENCOUNTER — Ambulatory Visit (INDEPENDENT_AMBULATORY_CARE_PROVIDER_SITE_OTHER): Payer: Medicare HMO | Admitting: Nurse Practitioner

## 2021-01-06 DIAGNOSIS — J40 Bronchitis, not specified as acute or chronic: Secondary | ICD-10-CM | POA: Diagnosis not present

## 2021-01-06 MED ORDER — DOXYCYCLINE HYCLATE 100 MG PO TABS: 100.0000 mg | ORAL_TABLET | Freq: Two times a day (BID) | ORAL | 0 refills | Status: DC

## 2021-01-06 MED ORDER — PROMETHAZINE-DM 6.25-15 MG/5ML PO SYRP: 5.0000 mL | ORAL_SOLUTION | Freq: Four times a day (QID) | ORAL | 0 refills | Status: DC | PRN

## 2021-01-06 MED ORDER — PREDNISONE 20 MG PO TABS: 40.0000 mg | ORAL_TABLET | Freq: Every day | ORAL | 0 refills | Status: AC

## 2021-01-06 NOTE — Progress Notes (Signed)
Virtual Visit  Note Due to COVID-19 pandemic this visit was conducted virtually. This visit type was conducted due to national recommendations for restrictions regarding the COVID-19 Pandemic (e.g. social distancing, sheltering in place) in an effort to limit this patient's exposure and mitigate transmission in our community. All issues noted in this document were discussed and addressed.  A physical exam was not performed with this format.  I connected with Rodney Hernandez on 01/06/21 at 1:40 by telephone and verified that I am speaking with the correct person using two identifiers. Rodney Hernandez is currently located at home and no one  is currently with him during visit. The provider, Mary-Margaret Hassell Done, FNP is located in their office at time of visit.  I discussed the limitations, risks, security and privacy concerns of performing an evaluation and management service by telephone and the availability of in person appointments. I also discussed with the patient that there may be a patient responsible charge related to this service. The patient expressed understanding and agreed to proceed.   History and Present Illness:  URI  This is a new problem. The current episode started in the past 7 days. The problem has been gradually worsening. The maximum temperature recorded prior to his arrival was 100.4 - 100.9 F. The fever has been present for 3 to 4 days. Associated symptoms include congestion and coughing. Pertinent negatives include no headaches or sore throat. He has tried nothing for the symptoms. The treatment provided mild relief.     Review of Systems  HENT:  Positive for congestion. Negative for sore throat.   Respiratory:  Positive for cough.   Neurological:  Negative for headaches.    Observations/Objective: Alert and oriented- answers all questions appropriately No distress Ras[y voice  Deep dry cough   Assessment and Plan: Rodney Hernandez in today with chief complaint of No chief  complaint on file.   1. Bronchitis 1. Take meds as prescribed 2. Use a cool mist humidifier especially during the winter months and when heat has been humid. 3. Use saline nose sprays frequently 4. Saline irrigations of the nose can be very helpful if done frequently.  * 4X daily for 1 week*  * Use of a nettie pot can be helpful with this. Follow directions with this* 5. Drink plenty of fluids 6. Keep thermostat turn down low 7.For any cough or congestion- promethazine as ordered 8. For fever or aces or pains- take tylenol or ibuprofen appropriate for age and weight.  * for fevers greater than 101 orally you may alternate ibuprofen and tylenol every  3 hours.    - doxycycline (VIBRA-TABS) 100 MG tablet; Take 1 tablet (100 mg total) by mouth 2 (two) times daily. 1 po bid  Dispense: 20 tablet; Refill: 0 - promethazine-dextromethorphan (PROMETHAZINE-DM) 6.25-15 MG/5ML syrup; Take 5 mLs by mouth 4 (four) times daily as needed for cough.  Dispense: 118 mL; Refill: 0 - predniSONE (DELTASONE) 20 MG tablet; Take 2 tablets (40 mg total) by mouth daily with breakfast for 5 days. 2 po daily for 5 days  Dispense: 10 tablet; Refill: 0     Follow Up Instructions: prn    I discussed the assessment and treatment plan with the patient. The patient was provided an opportunity to ask questions and all were answered. The patient agreed with the plan and demonstrated an understanding of the instructions.   The patient was advised to call back or seek an in-person evaluation if the symptoms worsen or if the  condition fails to improve as anticipated.  The above assessment and management plan was discussed with the patient. The patient verbalized understanding of and has agreed to the management plan. Patient is aware to call the clinic if symptoms persist or worsen. Patient is aware when to return to the clinic for a follow-up visit. Patient educated on when it is appropriate to go to the emergency  department.   Time call ended:  1:57  I provided 11 minutes of  non face-to-face time during this encounter.    Mary-Margaret Hassell Done, FNP

## 2021-01-06 NOTE — Patient Instructions (Signed)
1. Take meds as prescribed 2. Use a cool mist humidifier especially during the winter months and when heat has been humid. 3. Use saline nose sprays frequently 4. Saline irrigations of the nose can be very helpful if done frequently.  * 4X daily for 1 week*  * Use of a nettie pot can be helpful with this. Follow directions with this* 5. Drink plenty of fluids 6. Keep thermostat turn down low 7.For any cough or congestion- prometazine dextromethorphan 8. For fever or aces or pains- take tylenol or ibuprofen appropriate for age and weight.  * for fevers greater than 101 orally you may alternate ibuprofen and tylenol every  3 hours.

## 2021-02-03 ENCOUNTER — Other Ambulatory Visit: Payer: Self-pay | Admitting: Nurse Practitioner

## 2021-02-03 DIAGNOSIS — I6930 Unspecified sequelae of cerebral infarction: Secondary | ICD-10-CM

## 2021-02-03 DIAGNOSIS — E785 Hyperlipidemia, unspecified: Secondary | ICD-10-CM

## 2021-02-03 DIAGNOSIS — I10 Essential (primary) hypertension: Secondary | ICD-10-CM

## 2021-02-10 ENCOUNTER — Encounter: Payer: Self-pay | Admitting: Nurse Practitioner

## 2021-02-10 ENCOUNTER — Ambulatory Visit (INDEPENDENT_AMBULATORY_CARE_PROVIDER_SITE_OTHER): Payer: Medicare PPO | Admitting: Nurse Practitioner

## 2021-02-10 VITALS — BP 119/63 | HR 69 | Temp 98.5°F | Resp 20 | Ht 67.0 in | Wt 175.0 lb

## 2021-02-10 DIAGNOSIS — J4520 Mild intermittent asthma, uncomplicated: Secondary | ICD-10-CM | POA: Diagnosis not present

## 2021-02-10 DIAGNOSIS — D509 Iron deficiency anemia, unspecified: Secondary | ICD-10-CM | POA: Diagnosis not present

## 2021-02-10 DIAGNOSIS — I10 Essential (primary) hypertension: Secondary | ICD-10-CM | POA: Diagnosis not present

## 2021-02-10 DIAGNOSIS — F321 Major depressive disorder, single episode, moderate: Secondary | ICD-10-CM

## 2021-02-10 DIAGNOSIS — I63239 Cerebral infarction due to unspecified occlusion or stenosis of unspecified carotid arteries: Secondary | ICD-10-CM

## 2021-02-10 DIAGNOSIS — R972 Elevated prostate specific antigen [PSA]: Secondary | ICD-10-CM

## 2021-02-10 DIAGNOSIS — K219 Gastro-esophageal reflux disease without esophagitis: Secondary | ICD-10-CM | POA: Diagnosis not present

## 2021-02-10 DIAGNOSIS — N401 Enlarged prostate with lower urinary tract symptoms: Secondary | ICD-10-CM | POA: Diagnosis not present

## 2021-02-10 DIAGNOSIS — E119 Type 2 diabetes mellitus without complications: Secondary | ICD-10-CM | POA: Diagnosis not present

## 2021-02-10 DIAGNOSIS — I6930 Unspecified sequelae of cerebral infarction: Secondary | ICD-10-CM

## 2021-02-10 DIAGNOSIS — E785 Hyperlipidemia, unspecified: Secondary | ICD-10-CM

## 2021-02-10 DIAGNOSIS — I700 Atherosclerosis of aorta: Secondary | ICD-10-CM

## 2021-02-10 LAB — BAYER DCA HB A1C WAIVED: HB A1C (BAYER DCA - WAIVED): 7.8 % — ABNORMAL HIGH (ref 4.8–5.6)

## 2021-02-10 MED ORDER — AMLODIPINE BESYLATE 5 MG PO TABS: 5.0000 mg | ORAL_TABLET | Freq: Every day | ORAL | 1 refills | Status: DC

## 2021-02-10 MED ORDER — PANTOPRAZOLE SODIUM 40 MG PO TBEC: 40.0000 mg | DELAYED_RELEASE_TABLET | Freq: Two times a day (BID) | ORAL | 1 refills | Status: DC

## 2021-02-10 MED ORDER — CLOPIDOGREL BISULFATE 75 MG PO TABS: 75.0000 mg | ORAL_TABLET | Freq: Every day | ORAL | 1 refills | Status: DC

## 2021-02-10 MED ORDER — ESCITALOPRAM OXALATE 20 MG PO TABS: 20.0000 mg | ORAL_TABLET | Freq: Every day | ORAL | 1 refills | Status: DC

## 2021-02-10 MED ORDER — OLMESARTAN MEDOXOMIL 40 MG PO TABS: 40.0000 mg | ORAL_TABLET | Freq: Every day | ORAL | 1 refills | Status: DC

## 2021-02-10 MED ORDER — ATORVASTATIN CALCIUM 40 MG PO TABS: 40.0000 mg | ORAL_TABLET | Freq: Every day | ORAL | 1 refills | Status: DC

## 2021-02-10 MED ORDER — FLUTICASONE FUROATE-VILANTEROL 100-25 MCG/ACT IN AEPB: 1.0000 | INHALATION_SPRAY | Freq: Every day | RESPIRATORY_TRACT | 11 refills | Status: DC

## 2021-02-10 MED ORDER — TRAMADOL HCL 50 MG PO TABS: 50.0000 mg | ORAL_TABLET | Freq: Two times a day (BID) | ORAL | 2 refills | Status: DC | PRN

## 2021-02-10 MED ORDER — POLYSACCHARIDE IRON COMPLEX 150 MG PO CAPS: ORAL_CAPSULE | ORAL | 1 refills | Status: DC

## 2021-02-10 NOTE — Patient Instructions (Signed)

## 2021-02-10 NOTE — Addendum Note (Signed)
Addended by: Chevis Pretty on: 02/10/2021 03:02 PM   Modules accepted: Orders

## 2021-02-10 NOTE — Progress Notes (Signed)
Subjective:    Patient ID: Rodney Hernandez, male    DOB: April 15, 1945, 76 y.o.   MRN: [id number]  Chief Complaint: medical management of chronic issues     HPI:  Rodney Hernandez is a 76 y.o. who identifies as a male who was assigned male at birth.   Social history: Lives with: by hisself- his daughters check on him daily Work history: retired   Scientist, forensic in today for follow up of the following chronic medical issues:  1. Essential hypertension No c/o chest pain,  or headache. Does not check blood pressure at home. BP Readings from Last 3 Encounters:  11/09/20 119/63  08/25/20 130/70  08/09/20 (!) 165/80     2. Carotid stenosis, symptomatic, with infarction ([place]) Had endarectomy on left in the past. He has not had a doppler study in awhile. They will see the vascular doctor in 2 months.  3. Aortic atherosclerosis ([place]) LAst saw dr. Harl Bowie on 08/25/20. At that time he was c/o dyspnea. Office note was reviewed and there were no changes made to plan of care.  4. Hyperlipidemia with target LDL less than 100 Does try to watch diet and tries to stay active. But does no dedicated exercises. Lab Results  Component Value Date   CHOL 100 11/09/2020   HDL 39 (L) 11/09/2020   LDLCALC 43 11/09/2020   TRIG 93 11/09/2020   CHOLHDL 2.6 11/09/2020     5. Diabetes mellitus without complication ([place]) He doesn't check his blod sugars often enough to even know what it is. He denies any symptoms of low blood sugar. Lab Results  Component Value Date   HGBA1C 6.6 (H) 11/09/2020      6. Mild intermittent chronic asthma without complication The only inhaler that he has is albuterol neb and inhaler. He uses some albuterol every night.  7. Gastroesophageal reflux disease without esophagitis Is on protonix daily and is doing well.  8. Benign prostatic hyperplasia with lower urinary tract symptoms, symptom details unspecified No problems voiding. Last saw urology on 11/30/20. His current treatmnet  is lupron injections. Lab Results  Component Value Date   PSA1 21.1 (H) 10/01/2020   PSA1 17.5 (H) 05/06/2020   PSA1 12.8 (H) 06/26/2019      9. Iron deficiency anemia, unspecified iron deficiency anemia type No c/o fatigue Lab Results  Component Value Date   HGB 13.9 11/09/2020     10. Late effect of cerebrovascular accident (CVA) Patient denies any residual effects.  11. Elevated prostate specific antigen (PSA) Lab Results  Component Value Date   PSA1 21.1 (H) 10/01/2020   PSA1 17.5 (H) 05/06/2020   PSA1 12.8 (H) 06/26/2019   12. depression Is on lexapro and is doing well. Depression screen [facility] 2/9 02/10/2021 11/09/2020 08/09/2020  Decreased Interest '1 1 1  ' Down, Depressed, Hopeless 1 0 1  PHQ - 2 Score '2 1 2  ' Altered sleeping '1 2 3  ' Tired, decreased energy '1 2 2  ' Change in appetite 1 3 0  Feeling bad or failure about yourself  1 0 1  Trouble concentrating '1 3 2  ' Moving slowly or fidgety/restless 1 0 0  Suicidal thoughts 0 0 0  PHQ-9 Score '8 11 10  ' Difficult doing work/chores Not difficult at all Somewhat difficult Not difficult at all  Some recent data might be hidden      New complaints: Coughing a lot. At night and during the day.   Allergies  Allergen Reactions   Nsaids Shortness Of  Breath   Aspirin Other (See Comments)    Affects patient breathing   Morphine And Related Hives and Itching   Sulfa Antibiotics    Outpatient Encounter Medications as of 02/10/2021  Medication Sig   Accu-Chek Softclix Lancets lancets Test BS up to QID Dx E11.9   albuterol (VENTOLIN HFA) 108 (90 Base) MCG/ACT inhaler TAKE 2 PUFFS BY MOUTH EVERY 6 HOURS AS NEEDED FOR WHEEZE OR SHORTNESS OF BREATH   amLODipine (NORVASC) 5 MG tablet Take 1 tablet (5 mg total) by mouth daily.   aspirin 81 MG chewable tablet Chew 81 mg by mouth daily.   atorvastatin (LIPITOR) 40 MG tablet TAKE ONE TABLET ONCE DAILY   blood glucose meter kit and supplies KIT Dispense based on patient and insurance  preference. Use up to four times daily as directed. (FOR ICD-9 250.00, 250.01).   cholecalciferol (VITAMIN D3) 25 MCG (1000 UNIT) tablet Take 1 tablet (1,000 Units total) by mouth daily.   clopidogrel (PLAVIX) 75 MG tablet TAKE ONE TABLET ONCE DAILY   doxycycline (VIBRA-TABS) 100 MG tablet Take 1 tablet (100 mg total) by mouth 2 (two) times daily. 1 po bid   escitalopram (LEXAPRO) 20 MG tablet Take 1 tablet (20 mg total) by mouth daily.   glucose blood (ACCU-CHEK GUIDE) test strip Test BS up to QID Dx E11.9   iron polysaccharides (NIFEREX) 150 MG capsule TAKE 1 CAPSULE BY MOUTH EVERY DAY   olmesartan (BENICAR) 40 MG tablet TAKE ONE TABLET ONCE DAILY   pantoprazole (PROTONIX) 40 MG tablet Take 1 tablet (40 mg total) by mouth 2 (two) times daily.   promethazine-dextromethorphan (PROMETHAZINE-DM) 6.25-15 MG/5ML syrup Take 5 mLs by mouth 4 (four) times daily as needed for cough.   sucralfate (CARAFATE) 1 g tablet TAKE ONE TABLET FOUR TIMES DAILY BEFORE MEALS AND AT BEDTIME   traMADol (ULTRAM) 50 MG tablet Take 1 tablet (50 mg total) by mouth every 12 (twelve) hours as needed.   No facility-administered encounter medications on file as of 02/10/2021.    Past Surgical History:  Procedure Laterality Date   collapsed lung     HERNIA REPAIR     PROSTATE SURGERY      Family History  Problem Relation Age of Onset   Lung cancer Father    Prostate cancer Father    Stomach cancer Maternal Grandmother    Prostate cancer Paternal Uncle    Cervical cancer Daughter    Psoriasis Daughter    Arthritis Daughter        psoriatic arthritis      Controlled substance contract: n/a     Review of Systems  Constitutional:  Negative for diaphoresis.  Eyes:  Negative for pain.  Respiratory:  Negative for shortness of breath and wheezing.   Cardiovascular:  Negative for chest pain, palpitations and leg swelling.  Gastrointestinal:  Negative for abdominal pain.  Endocrine: Negative for polydipsia.   Skin:  Negative for rash.  Neurological:  Negative for dizziness, weakness and headaches.  Hematological:  Does not bruise/bleed easily.  All other systems reviewed and are negative.     Objective:   Physical Exam Vitals and nursing note reviewed.  Constitutional:      Appearance: Normal appearance. He is well-developed.  HENT:     Head: Normocephalic.     Nose: Nose normal.     Mouth/Throat:     Mouth: Mucous membranes are moist.     Pharynx: Oropharynx is clear.  Eyes:     Pupils: Pupils  are equal, round, and reactive to light.  Neck:     Thyroid: No thyroid mass or thyromegaly.     Vascular: No carotid bruit or JVD.     Trachea: Phonation normal.  Cardiovascular:     Rate and Rhythm: Normal rate and regular rhythm.     Comments: Right carotid bruit Pulmonary:     Effort: Pulmonary effort is normal. No respiratory distress.     Breath sounds: Normal breath sounds.  Abdominal:     General: Bowel sounds are normal.     Palpations: Abdomen is soft.     Tenderness: There is no abdominal tenderness.  Musculoskeletal:        General: Normal range of motion.     Cervical back: Normal range of motion and neck supple.  Lymphadenopathy:     Cervical: No cervical adenopathy.  Skin:    General: Skin is warm and dry.  Neurological:     Mental Status: He is alert and oriented to person, place, and time.  Psychiatric:        Behavior: Behavior normal.        Thought Content: Thought content normal.        Judgment: Judgment normal.    BP 119/63    Pulse 69    Temp 98.5 F (36.9 C) (Temporal)    Resp 20    Ht '5\' 7"'  (1.702 m)    Wt 175 lb (79.4 kg)    SpO2 95%    BMI 27.41 kg/m   HGBA1c 7.8%     Assessment & Plan:   Rodney Hernandez comes in today with chief complaint of Medical Management of Chronic Issues   Diagnosis and orders addressed:  1. Essential hypertension Low sodium diet - CBC with Differential/Platelet - CMP14+EGFR - olmesartan (BENICAR) 40 MG tablet; Take  1 tablet (40 mg total) by mouth daily.  Dispense: 90 tablet; Refill: 1 - amLODipine (NORVASC) 5 MG tablet; Take 1 tablet (5 mg total) by mouth daily.  Dispense: 90 tablet; Refill: 1  2. Carotid stenosis, symptomatic, with infarction [facility]) Will see vascular surgeon in a few months  3. Aortic atherosclerosis ([place]) Keep follow up with DR. Branch  4. Hyperlipidemia with target LDL less than 100 Low fat diet - Lipid panel - atorvastatin (LIPITOR) 40 MG tablet; Take 1 tablet (40 mg total) by mouth daily.  Dispense: 90 tablet; Refill: 1  5. Diabetes mellitus without complication ([place]) Watch carbs in meds - Bayer DCA Hb A1c Waived  6. Mild intermittent chronic asthma without complication Will add BREO to see if will help with braething - fluticasone furoate-vilanterol (BREO ELLIPTA) 100-25 MCG/ACT AEPB; Inhale 1 puff into the lungs daily.  Dispense: 1 each; Refill: 11  7. Gastroesophageal reflux disease without esophagitis Avoid spicy foods Do not eat 2 hours prior to bedtime - pantoprazole (PROTONIX) 40 MG tablet; Take 1 tablet (40 mg total) by mouth 2 (two) times daily.  Dispense: 180 tablet; Refill: 1  8. Benign prostatic hyperplasia with lower urinary tract symptoms, symptom details unspecified Keep follow up with urology  9. Iron deficiency anemia, unspecified iron deficiency anemia type - iron polysaccharides (NIFEREX) 150 MG capsule; TAKE 1 CAPSULE BY MOUTH EVERY DAY  Dispense: 90 capsule; Refill: 1  10. Late effect of cerebrovascular accident (CVA) - clopidogrel (PLAVIX) 75 MG tablet; Take 1 tablet (75 mg total) by mouth daily.  Dispense: 90 tablet; Refill: 1  11. Elevated prostate specific antigen (PSA) - PSA, total and free  12. Depression, major, single episode, moderate ([facility]) Stress management - escitalopram (LEXAPRO) 20 MG tablet; Take 1 tablet (20 mg total) by mouth daily.  Dispense: 90 tablet; Refill: 1   Labs pending Health Maintenance reviewed Diet and exercise  encouraged  Follow up plan: 3 months   Mary-Margaret Hassell Done, FNP

## 2021-02-11 LAB — CMP14+EGFR
ALT: 20 IU/L (ref 0–44)
AST: 18 IU/L (ref 0–40)
Albumin/Globulin Ratio: 1.7 (ref 1.2–2.2)
Albumin: 4.3 g/dL (ref 3.7–4.7)
Alkaline Phosphatase: 106 IU/L (ref 44–121)
BUN/Creatinine Ratio: 18 (ref 10–24)
BUN: 19 mg/dL (ref 8–27)
Bilirubin Total: 0.4 mg/dL (ref 0.0–1.2)
CO2: 26 mmol/L (ref 20–29)
Calcium: 10.8 mg/dL — ABNORMAL HIGH (ref 8.6–10.2)
Chloride: 98 mmol/L (ref 96–106)
Creatinine, Ser: 1.06 mg/dL (ref 0.76–1.27)
Globulin, Total: 2.5 g/dL (ref 1.5–4.5)
Glucose: 163 mg/dL — ABNORMAL HIGH (ref 70–99)
Potassium: 4.6 mmol/L (ref 3.5–5.2)
Sodium: 137 mmol/L (ref 134–144)
Total Protein: 6.8 g/dL (ref 6.0–8.5)
eGFR: 72 mL/min/{1.73_m2} (ref >59)

## 2021-02-11 LAB — LIPID PANEL
Chol/HDL Ratio: 2.5 ratio (ref 0.0–5.0)
Cholesterol, Total: 119 mg/dL (ref 100–199)
HDL: 48 mg/dL (ref >39)
LDL Chol Calc (NIH): 50 mg/dL (ref 0–99)
Triglycerides: 115 mg/dL (ref 0–149)
VLDL Cholesterol Cal: 21 mg/dL (ref 5–40)

## 2021-02-11 LAB — CBC WITH DIFFERENTIAL/PLATELET
Basophils Absolute: 0.1 10*3/uL (ref 0.0–0.2)
Basos: 1 %
EOS (ABSOLUTE): 0.2 10*3/uL (ref 0.0–0.4)
Eos: 2 %
Hematocrit: 43.8 % (ref 37.5–51.0)
Hemoglobin: 14.1 g/dL (ref 13.0–17.7)
Immature Grans (Abs): 0.1 10*3/uL (ref 0.0–0.1)
Immature Granulocytes: 1 %
Lymphocytes Absolute: 1.8 10*3/uL (ref 0.7–3.1)
Lymphs: 16 %
MCH: 27 pg (ref 26.6–33.0)
MCHC: 32.2 g/dL (ref 31.5–35.7)
MCV: 84 fL (ref 79–97)
Monocytes Absolute: 0.9 10*3/uL (ref 0.1–0.9)
Monocytes: 8 %
Neutrophils Absolute: 8.3 10*3/uL — ABNORMAL HIGH (ref 1.4–7.0)
Neutrophils: 72 %
Platelets: 313 10*3/uL (ref 150–450)
RBC: 5.22 x10E6/uL (ref 4.14–5.80)
RDW: 14.9 % (ref 11.6–15.4)
WBC: 11.4 10*3/uL — ABNORMAL HIGH (ref 3.4–10.8)

## 2021-02-11 LAB — PSA, TOTAL AND FREE
PSA, Free Pct: 7.3 %
PSA, Free: 0.36 ng/mL
Prostate Specific Ag, Serum: 4.9 ng/mL — ABNORMAL HIGH (ref 0.0–4.0)

## 2021-02-28 ENCOUNTER — Other Ambulatory Visit: Payer: Self-pay | Admitting: Nurse Practitioner

## 2021-02-28 DIAGNOSIS — Q828 Other specified congenital malformations of skin: Secondary | ICD-10-CM | POA: Diagnosis not present

## 2021-02-28 DIAGNOSIS — D485 Neoplasm of uncertain behavior of skin: Secondary | ICD-10-CM | POA: Diagnosis not present

## 2021-02-28 DIAGNOSIS — L570 Actinic keratosis: Secondary | ICD-10-CM | POA: Diagnosis not present

## 2021-03-07 ENCOUNTER — Ambulatory Visit: Payer: Medicare HMO | Admitting: Cardiology

## 2021-03-12 ENCOUNTER — Other Ambulatory Visit: Payer: Self-pay | Admitting: Nurse Practitioner

## 2021-03-12 DIAGNOSIS — D509 Iron deficiency anemia, unspecified: Secondary | ICD-10-CM

## 2021-03-25 DIAGNOSIS — I6523 Occlusion and stenosis of bilateral carotid arteries: Secondary | ICD-10-CM | POA: Diagnosis not present

## 2021-03-28 ENCOUNTER — Other Ambulatory Visit: Payer: Self-pay | Admitting: Nurse Practitioner

## 2021-04-06 NOTE — Progress Notes (Signed)
? ?Cardiology Office Note   ? ?Date:  04/07/2021  ? ?ID:  Rodney Hernandez, DOB December 26, 1945, MRN [id number] ? ?PCP:  Chevis Pretty, FNP  ?Cardiologist: [name], MD   ? ?Chief Complaint  ?Patient presents with  ? Follow-up  ?  6 month visit  ? ? ?History of Present Illness:   ? ?Rodney Hernandez is a 76 y.o. male with past medical history of carotid artery stenosis (s/p L CEA in 01/2020), HTN, HLD, COPD, prior tobacco use and history of CVA (occurring in 01/2020 and received tPA and required thrombectomy) who presents to the office today for 53-monthfollow-up. ? ?He was last examined by Dr. BHarl Bowiein 08/2020 and reported stable dyspnea on exertion and intermittent chest discomfort in the setting of productive cough but no acute worsening of symptoms. He had been hospitalized for a CVA in 01/2021 and underwent left CEA at that time. He had recently been started on Amlodipine 2.5 mg daily and was encouraged to follow BP at home. He was continued on ASA and Plavix per Vascular Surgery. ? ?In talking with the patient and his daughter today, he reports overall doing well from a cardiac perspective since his last visit. He was recently started on Breo several months ago and reports his dyspnea has significantly improved. He was able to use a chainsaw earlier this week without significant dyspnea. No exertional chest pain or palpitations. No recent orthopnea, PND or pitting edema. He does experience intermittent dizziness which has been occurring since his prior CVA. Can occur sporadically and is not necessarily associated with positional changes. ? ? ?Past Medical History:  ?Diagnosis Date  ? Arthritis   ? psoriatic arthritis and osteo arthritis  ? Asthma   ? Cancer ([facility]   ? Prostate  ? Collapse of lung   ? Colon polyps   ? DOE (dyspnea on exertion) 12/02/2018  ? Eczema   ? GERD (gastroesophageal reflux disease)   ? Hypertension   ? Seasonal allergies   ? Stroke ([facility]   ? Vertigo   ? ? ?Past Surgical History:   ?Procedure Laterality Date  ? collapsed lung    ? HERNIA REPAIR    ? PROSTATE SURGERY    ? ? ?Current Medications: ?Outpatient Medications Prior to Visit  ?Medication Sig Dispense Refill  ? albuterol (VENTOLIN HFA) 108 (90 Base) MCG/ACT inhaler TAKE 2 PUFFS BY MOUTH EVERY 6 HOURS AS NEEDED FOR WHEEZE OR SHORTNESS OF BREATH 6.7 g 1  ? amLODipine (NORVASC) 5 MG tablet Take 1 tablet (5 mg total) by mouth daily. 90 tablet 1  ? aspirin 81 MG chewable tablet Chew 81 mg by mouth daily.    ? atorvastatin (LIPITOR) 40 MG tablet Take 1 tablet (40 mg total) by mouth daily. 90 tablet 1  ? blood glucose meter kit and supplies KIT Dispense based on patient and insurance preference. Use up to four times daily as directed. (FOR ICD-9 250.00, 250.01). 1 each 0  ? cholecalciferol (VITAMIN D3) 25 MCG (1000 UNIT) tablet Take 1 tablet (1,000 Units total) by mouth daily. 90 tablet 3  ? clopidogrel (PLAVIX) 75 MG tablet Take 1 tablet (75 mg total) by mouth daily. 90 tablet 1  ? escitalopram (LEXAPRO) 20 MG tablet Take 1 tablet (20 mg total) by mouth daily. 90 tablet 1  ? fluticasone furoate-vilanterol (BREO ELLIPTA) 100-25 MCG/ACT AEPB Inhale 1 puff into the lungs daily. 1 each 11  ? glucose blood (ACCU-CHEK GUIDE) test strip Test BS up to QID Dx  E11.9 100 each 12  ? iron polysaccharides (NIFEREX) 150 MG capsule TAKE 1 CAPSULE BY MOUTH EVERY DAY 90 capsule 1  ? olmesartan (BENICAR) 40 MG tablet Take 1 tablet (40 mg total) by mouth daily. 90 tablet 1  ? pantoprazole (PROTONIX) 40 MG tablet Take 1 tablet (40 mg total) by mouth 2 (two) times daily. 180 tablet 1  ? sucralfate (CARAFATE) 1 g tablet TAKE ONE TABLET FOUR TIMES DAILY BEFORE MEALS AND AT BEDTIME 120 tablet 0  ? traMADol (ULTRAM) 50 MG tablet Take 1 tablet (50 mg total) by mouth every 12 (twelve) hours as needed. 30 tablet 2  ? Accu-Chek Softclix Lancets lancets Test BS up to QID Dx E11.9 (Patient not taking: Reported on 04/07/2021) 100 each 12  ? ?No facility-administered  medications prior to visit.  ?  ? ?Allergies:   Nsaids, Aspirin, Morphine and related, and Sulfa antibiotics  ? ?Social History  ? ?Socioeconomic History  ? Marital status: Widowed  ?  Spouse name: Not on file  ? Number of children: 2  ? Years of education: 44  ? Highest education level: High school graduate  ?Occupational History  ? Occupation: Retired  ?  Comment: worked in a Writer for 33 years  ?Tobacco Use  ? Smoking status: Former  ?  Packs/day: 2.00  ?  Years: 22.00  ?  Pack years: 44.00  ?  Types: Cigarettes  ?  Quit date: 01/09/1981  ?  Years since quitting: 40.2  ? Smokeless tobacco: Never  ?Vaping Use  ? Vaping Use: Never used  ?Substance and Sexual Activity  ? Alcohol use: No  ? Drug use: No  ? Sexual activity: Not Currently  ?Other Topics Concern  ? Not on file  ?Social History Narrative  ? Widowed, 2 children  ? Right handed  ? 12th grade  ? 3 cups daily  ? ?Social Determinants of Health  ? ?Financial Resource Strain: Low Risk   ? Difficulty of Paying Living Expenses: Not hard at all  ?Food Insecurity: No Food Insecurity  ? Worried About Charity fundraiser in the Last Year: Never true  ? Ran Out of Food in the Last Year: Never true  ?Transportation Needs: No Transportation Needs  ? Lack of Transportation (Medical): No  ? Lack of Transportation (Non-Medical): No  ?Physical Activity: Sufficiently Active  ? Days of Exercise per Week: 4 days  ? Minutes of Exercise per Session: 40 min  ?Stress: No Stress Concern Present  ? Feeling of Stress : Not at all  ?Social Connections: Moderately Isolated  ? Frequency of Communication with Friends and Family: More than three times a week  ? Frequency of Social Gatherings with Friends and Family: Once a week  ? Attends Religious Services: More than 4 times per year  ? Active Member of Clubs or Organizations: No  ? Attends Archivist Meetings: Never  ? Marital Status: Widowed  ?  ? ?Family History:  The patient's family history includes Arthritis in his  daughter; Cervical cancer in his daughter; Lung cancer in his father; Prostate cancer in his father and paternal uncle; Psoriasis in his daughter; Stomach cancer in his maternal grandmother.  ? ?Review of Systems:   ? ?Please see the history of present illness.    ? ?All other systems reviewed and are otherwise negative except as noted above. ? ? ?Physical Exam:   ? ?VS:  BP 136/66   Pulse 64   Ht 5' 7.5" (1.715  m)   Wt 185 lb (83.9 kg)   SpO2 94%   BMI 28.55 kg/m?    ?General: Well developed, well nourished,male appearing in no acute distress. ?Head: Normocephalic, atraumatic. ?Neck: No carotid bruits. JVD not elevated.  ?Lungs: Respirations regular and unlabored, without wheezes or rales.  ?Heart: Regular rate and rhythm. No S3 or S4.  No murmur, no rubs, or gallops appreciated. ?Abdomen: Appears non-distended. No obvious abdominal masses. ?Msk:  Strength and tone appear normal for age. No obvious joint deformities or effusions. ?Extremities: No clubbing or cyanosis. No pitting edema.  Distal pedal pulses are 2+ bilaterally. ?Neuro: Alert and oriented X 3. Moves all extremities spontaneously. No focal deficits noted. ?Psych:  Responds to questions appropriately with a normal affect. ?Skin: No rashes or lesions noted ? ?Wt Readings from Last 3 Encounters:  ?04/07/21 185 lb (83.9 kg)  ?02/10/21 175 lb (79.4 kg)  ?11/09/20 174 lb (78.9 kg)  ?  ? ?Studies/Labs Reviewed:  ? ?EKG:  EKG is not ordered today.  ? ?Recent Labs: ?02/10/2021: ALT 20; BUN 19; Creatinine, Ser 1.06; Hemoglobin 14.1; Platelets 313; Potassium 4.6; Sodium 137  ? ?Lipid Panel ?   ?Component Value Date/Time  ? CHOL 119 02/10/2021 1419  ? CHOL 186 05/20/2012 1028  ? TRIG 115 02/10/2021 1419  ? TRIG 76 06/11/2014 1241  ? TRIG 164 (H) 05/20/2012 1028  ? HDL 48 02/10/2021 1419  ? HDL 39 (L) 06/11/2014 1241  ? HDL 42 05/20/2012 1028  ? CHOLHDL 2.5 02/10/2021 1419  ? [place] 50 02/10/2021 1419  ? [place] 89 01/20/2013 1605  ? LDLCALC 111 (H) 05/20/2012  1028  ? ? ?Additional studies/ records that were reviewed today include:  ? ?Echocardiogram: 11/2018 ?IMPRESSIONS  ? ? ? 1. Left ventricular ejection fraction, by visual estimation, is 60 to  ?65%. The left ventricle has

## 2021-04-07 ENCOUNTER — Encounter: Payer: Self-pay | Admitting: Student

## 2021-04-07 ENCOUNTER — Ambulatory Visit: Payer: Medicare PPO | Admitting: Student

## 2021-04-07 VITALS — BP 136/66 | HR 64 | Ht 67.5 in | Wt 185.0 lb

## 2021-04-07 DIAGNOSIS — I6523 Occlusion and stenosis of bilateral carotid arteries: Secondary | ICD-10-CM

## 2021-04-07 DIAGNOSIS — I10 Essential (primary) hypertension: Secondary | ICD-10-CM

## 2021-04-07 DIAGNOSIS — R0602 Shortness of breath: Secondary | ICD-10-CM

## 2021-04-07 DIAGNOSIS — E785 Hyperlipidemia, unspecified: Secondary | ICD-10-CM

## 2021-04-07 NOTE — Patient Instructions (Signed)
Medication Instructions:  ?Your physician recommends that you continue on your current medications as directed. Please refer to the Current Medication list given to you today. ? ?*If you need a refill on your cardiac medications before your next appointment, please call your pharmacy* ? ? ?Lab Work: ?NONE  ? ?If you have labs (blood work) drawn today and your tests are completely normal, you will receive your results only by: ?MyChart Message (if you have MyChart) OR ?A paper copy in the mail ?If you have any lab test that is abnormal or we need to change your treatment, we will call you to review the results. ? ? ?Testing/Procedures: ?NONE  ? ? ?Follow-Up: ?At [facility], you and your health needs are our priority.  As part of our continuing mission to provide you with exceptional heart care, we have created designated Provider Care Teams.  These Care Teams include your primary Cardiologist (physician) and Advanced Practice Providers (APPs -  Physician Assistants and Nurse Practitioners) who all work together to provide you with the care you need, when you need it. ? ?We recommend signing up for the patient portal called "MyChart".  Sign up information is provided on this After Visit Summary.  MyChart is used to connect with patients for Virtual Visits (Telemedicine).  Patients are able to view lab/test results, encounter notes, upcoming appointments, etc.  Non-urgent messages can be sent to your provider as well.   ?To learn more about what you can do with MyChart, go to [email or web address].   ? ?Your next appointment:   ?6 month(s) ? ?The format for your next appointment:   ?In Person ? ?Provider:   ?You may see [name], MD or one of the following Advanced Practice Providers on your designated Care Team:   ?Bernerd Pho, PA-C  ?Ermalinda Barrios, PA-C   ? ? ?Other Instructions ?Thank you for choosing [facility]! ? ? ? ?

## 2021-04-28 ENCOUNTER — Other Ambulatory Visit: Payer: Self-pay | Admitting: Nurse Practitioner

## 2021-05-02 ENCOUNTER — Other Ambulatory Visit: Payer: Self-pay | Admitting: Nurse Practitioner

## 2021-05-02 NOTE — Telephone Encounter (Signed)
Pt has apt 05/10/2021 ?

## 2021-05-02 NOTE — Telephone Encounter (Signed)
Controlled - pt has appt for 05/10/2021 - ntbs sooner if refill needed now  ?

## 2021-05-10 ENCOUNTER — Ambulatory Visit (INDEPENDENT_AMBULATORY_CARE_PROVIDER_SITE_OTHER): Payer: Medicare PPO | Admitting: Nurse Practitioner

## 2021-05-10 ENCOUNTER — Encounter: Payer: Self-pay | Admitting: Nurse Practitioner

## 2021-05-10 VITALS — BP 139/62 | HR 55 | Temp 97.8°F | Resp 20 | Ht 67.0 in | Wt 184.0 lb

## 2021-05-10 DIAGNOSIS — L4050 Arthropathic psoriasis, unspecified: Secondary | ICD-10-CM | POA: Diagnosis not present

## 2021-05-10 DIAGNOSIS — E785 Hyperlipidemia, unspecified: Secondary | ICD-10-CM

## 2021-05-10 DIAGNOSIS — K219 Gastro-esophageal reflux disease without esophagitis: Secondary | ICD-10-CM

## 2021-05-10 DIAGNOSIS — E119 Type 2 diabetes mellitus without complications: Secondary | ICD-10-CM | POA: Diagnosis not present

## 2021-05-10 DIAGNOSIS — I700 Atherosclerosis of aorta: Secondary | ICD-10-CM

## 2021-05-10 DIAGNOSIS — I10 Essential (primary) hypertension: Secondary | ICD-10-CM | POA: Diagnosis not present

## 2021-05-10 DIAGNOSIS — E291 Testicular hypofunction: Secondary | ICD-10-CM

## 2021-05-10 DIAGNOSIS — R609 Edema, unspecified: Secondary | ICD-10-CM

## 2021-05-10 DIAGNOSIS — Z23 Encounter for immunization: Secondary | ICD-10-CM

## 2021-05-10 DIAGNOSIS — D509 Iron deficiency anemia, unspecified: Secondary | ICD-10-CM

## 2021-05-10 DIAGNOSIS — I6930 Unspecified sequelae of cerebral infarction: Secondary | ICD-10-CM

## 2021-05-10 DIAGNOSIS — N401 Enlarged prostate with lower urinary tract symptoms: Secondary | ICD-10-CM

## 2021-05-10 DIAGNOSIS — I63239 Cerebral infarction due to unspecified occlusion or stenosis of unspecified carotid arteries: Secondary | ICD-10-CM

## 2021-05-10 LAB — BAYER DCA HB A1C WAIVED: HB A1C (BAYER DCA - WAIVED): 7.6 % — ABNORMAL HIGH (ref 4.8–5.6)

## 2021-05-10 MED ORDER — TRAMADOL HCL 50 MG PO TABS: 50.0000 mg | ORAL_TABLET | Freq: Two times a day (BID) | ORAL | 2 refills | Status: DC

## 2021-05-10 NOTE — Patient Instructions (Signed)

## 2021-05-10 NOTE — Progress Notes (Signed)
? ?Subjective:  ? ? Patient ID: Rodney Hernandez, male    DOB: Sep 21, 1945, 76 y.o.   MRN: [id number] ? ? ?Chief Complaint: medical management of chronic issues  ?  ? ?HPI: ? ?Rodney Hernandez is a 76 y.o. who identifies as a male who was assigned male at birth.  ? ?Social history: ?Lives with: by hisself ?Work history: retired ? ? ?Comes in today for follow up of the following chronic medical issues: ? ?1. Essential hypertension ?No c/o chest pain, sob or headache. Does not check blood pressure at home. ?BP Readings from Last 3 Encounters:  ?05/10/21 139/62  ?04/07/21 136/66  ?02/10/21 119/63  ? ? ?2. Carotid stenosis, symptomatic, with infarction [facility]) ?Had at cardiology office in march of 2023 and they said was less than 50% occluded. ? ?3. Aortic atherosclerosis ([place]) ?Sees cardiology every 6 months ? ?4. Gastroesophageal reflux disease without esophagitis ?Is on protonix and is doing well. ? ?5. Diabetes mellitus without complication ([place]) ?He does not check his blood sugars at home. Not real strict on his diet either. ?Lab Results  ?Component Value Date  ? HGBA1C 7.8 (H) 02/10/2021  ? ? ?6. Hypogonadism in male ?No problems ? ?7. Psoriatic arthritis ([place]) ?[place] all the time. Takes ultram as needed. He ends up taking at least 1 tablet daily. ?Pain assessment: ?Cause of pain- psoriatic arthritis ?Pain location- all over ?Pain on scale of 1-10- 8/10 ?Frequency- daily ?What increases pain-to much activity ?What makes pain Better-rest ?Effects on ADL - none ?Any change in general medical condition-none ? ?Current opioids rx- ultram ?# meds rx- "30 ?Effectiveness of current meds-helps ?Adverse reactions from pain meds-none ?Morphine equivalent- 10 MME ? ?Pill count performed-No ?Last drug screen - never ?( high risk q63m moderate risk q629mlow risk yearly ) ?Urine drug screen today- Yes ?Was [place]- yes ? If yes were their any concerning findings? - no ? ? ?Overdose risk: 1 ? ? ?Pain contract signed  on:05/10/21 ? ? ?8. Benign prostatic hyperplasia with lower urinary tract symptoms, symptom details unspecified ?No problem s voiding. He sees urology yearly ? ?9. Peripheral edema ?Only occasionally ? ?10. Hyperlipidemia with target LDL less than 100 ?Doe snot really watch diet and is not as active as use to be. ?Lab Results  ?Component Value Date  ? CHOL 119 02/10/2021  ? HDL 48 02/10/2021  ? [place]0 02/10/2021  ? TRIG 115 02/10/2021  ? CHOLHDL 2.5 02/10/2021  ? ? ? ?11. Late effect of cerebrovascular accident (CVA) ?No permanent effects ? ?12. Iron deficiency anemia, unspecified iron deficiency anemia type ?Lab Results  ?Component Value Date  ? HGB 14.1 02/10/2021  ? ? ? ?13. Elevated prostate specific antigen (PSA) ?Lab Results  ?Component Value Date  ? PSA1 4.9 (H) 02/10/2021  ? PSA1 21.1 (H) 10/01/2020  ? PSA1 17.5 (H) 05/06/2020  ? ? ? ? ? ?New complaints: ?None today ? ?Allergies  ?Allergen Reactions  ? Nsaids Shortness Of Breath  ? Aspirin Other (See Comments)  ?  Affects patient breathing  ? Morphine And Related Hives and Itching  ? Sulfa Antibiotics   ? ?Outpatient Encounter Medications as of 05/10/2021  ?Medication Sig  ? Accu-Chek Softclix Lancets lancets Test BS up to QID Dx E11.9 (Patient not taking: Reported on 04/07/2021)  ? albuterol (VENTOLIN HFA) 108 (90 Base) MCG/ACT inhaler TAKE 2 PUFFS BY MOUTH EVERY 6 HOURS AS NEEDED FOR WHEEZE OR SHORTNESS OF BREATH  ? amLODipine (NORVASC) 5 MG tablet  Take 1 tablet (5 mg total) by mouth daily.  ? aspirin 81 MG chewable tablet Chew 81 mg by mouth daily.  ? atorvastatin (LIPITOR) 40 MG tablet Take 1 tablet (40 mg total) by mouth daily.  ? blood glucose meter kit and supplies KIT Dispense based on patient and insurance preference. Use up to four times daily as directed. (FOR ICD-9 250.00, 250.01).  ? cholecalciferol (VITAMIN D3) 25 MCG (1000 UNIT) tablet Take 1 tablet (1,000 Units total) by mouth daily.  ? clopidogrel (PLAVIX) 75 MG tablet Take 1 tablet (75 mg  total) by mouth daily.  ? escitalopram (LEXAPRO) 20 MG tablet Take 1 tablet (20 mg total) by mouth daily.  ? fluticasone furoate-vilanterol (BREO ELLIPTA) 100-25 MCG/ACT AEPB Inhale 1 puff into the lungs daily.  ? glucose blood (ACCU-CHEK GUIDE) test strip Test BS up to QID Dx E11.9  ? iron polysaccharides (NIFEREX) 150 MG capsule TAKE 1 CAPSULE BY MOUTH EVERY DAY  ? olmesartan (BENICAR) 40 MG tablet Take 1 tablet (40 mg total) by mouth daily.  ? pantoprazole (PROTONIX) 40 MG tablet Take 1 tablet (40 mg total) by mouth 2 (two) times daily.  ? sucralfate (CARAFATE) 1 g tablet TAKE ONE TABLET FOUR TIMES DAILY BEFORE MEALS AND AT BEDTIME  ? traMADol (ULTRAM) 50 MG tablet Take 1 tablet (50 mg total) by mouth every 12 (twelve) hours as needed.  ? ?No facility-administered encounter medications on file as of 05/10/2021.  ? ? ?Past Surgical History:  ?Procedure Laterality Date  ? collapsed lung    ? HERNIA REPAIR    ? PROSTATE SURGERY    ? ? ?Family History  ?Problem Relation Age of Onset  ? Lung cancer Father   ? Prostate cancer Father   ? Stomach cancer Maternal Grandmother   ? Prostate cancer Paternal Uncle   ? Cervical cancer Daughter   ? Psoriasis Daughter   ? Arthritis Daughter   ?     psoriatic arthritis  ? ? ? ? ? ? ?Review of Systems  ?Constitutional:  Negative for diaphoresis.  ?Eyes:  Negative for pain.  ?Respiratory:  Negative for shortness of breath.   ?Cardiovascular:  Negative for chest pain, palpitations and leg swelling.  ?Gastrointestinal:  Negative for abdominal pain.  ?Endocrine: Negative for polydipsia.  ?Skin:  Negative for rash.  ?Neurological:  Negative for dizziness, weakness and headaches.  ?Hematological:  Does not bruise/bleed easily.  ?All other systems reviewed and are negative. ? ?   ?Objective:  ? Physical Exam ?Vitals and nursing note reviewed.  ?Constitutional:   ?   Appearance: Normal appearance. He is well-developed.  ?HENT:  ?   Head: Normocephalic.  ?   Nose: Nose normal.  ?    Mouth/Throat:  ?   Mouth: Mucous membranes are moist.  ?   Pharynx: Oropharynx is clear.  ?Eyes:  ?   Pupils: Pupils are equal, round, and reactive to light.  ?Neck:  ?   Thyroid: No thyroid mass or thyromegaly.  ?   Vascular: No carotid bruit or JVD.  ?   Trachea: Phonation normal.  ?Cardiovascular:  ?   Rate and Rhythm: Normal rate and regular rhythm.  ?Pulmonary:  ?   Effort: Pulmonary effort is normal. No respiratory distress.  ?   Breath sounds: Normal breath sounds.  ?Abdominal:  ?   General: Bowel sounds are normal.  ?   Palpations: Abdomen is soft.  ?   Tenderness: There is no abdominal tenderness.  ?Musculoskeletal:     ?  General: Normal range of motion.  ?   Cervical back: Normal range of motion and neck supple.  ?Lymphadenopathy:  ?   Cervical: No cervical adenopathy.  ?Skin: ?   General: Skin is warm and dry.  ?Neurological:  ?   Mental Status: He is alert and oriented to person, place, and time.  ?Psychiatric:     ?   Behavior: Behavior normal.     ?   Thought Content: Thought content normal.     ?   Judgment: Judgment normal.  ? ? ?BP 139/62   Pulse (!) 55   Temp 97.8 ?F (36.6 ?C) (Temporal)   Resp 20   Ht '5\' 7"'  (1.702 m)   Wt 184 lb (83.5 kg)   SpO2 93%   BMI 28.82 kg/m?  ? ?Hgba1c 7.6% ? ?   ?Assessment & Plan:  ?Requan Hardge comes in today with chief complaint of No chief complaint on file. ? ? ?Diagnosis and orders addressed: ? ?1. Essential hypertension ?Low sodium diet ?- CBC with Differential/Platelet ?- CMP14+EGFR ?- CBC with Differential/Platelet ?- CMP14+EGFR ? ?2. Carotid stenosis, symptomatic, with infarction [facility]) ?3. Aortic atherosclerosis ([place]) ?Keep follow up with cardiology ? ?4. Gastroesophageal reflux disease without esophagitis ?Avoid spicy foods ?Do not eat 2 hours prior to bedtime ? ? ?5. Diabetes mellitus without complication ([place]) ?Watch carbs in diet ?- Bayer DCA Hb A1c Waived ? ?6. Hypogonadism in male ? ?7. Psoriatic arthritis ([place]) ?Keep follow up with  rheumatology ?- traMADol (ULTRAM) 50 MG tablet; Take 1 tablet (50 mg total) by mouth 2 (two) times daily.  Dispense: 60 tablet; Refill: 2 ?- ToxASSURE Select 13 (MW), Urine ? ?8. Benign prostatic hyperplasia with lower urinary tract symptoms, sympt

## 2021-05-10 NOTE — Addendum Note (Signed)
Addended by: Rolena Infante on: 05/10/2021 04:21 PM ? ? Modules accepted: Orders ? ?

## 2021-05-11 LAB — CBC WITH DIFFERENTIAL/PLATELET
Basophils Absolute: 0.1 10*3/uL (ref 0.0–0.2)
Basos: 1 %
EOS (ABSOLUTE): 0.6 10*3/uL — ABNORMAL HIGH (ref 0.0–0.4)
Eos: 6 %
Hematocrit: 40.2 % (ref 37.5–51.0)
Hemoglobin: 13.6 g/dL (ref 13.0–17.7)
Immature Grans (Abs): 0.2 10*3/uL — ABNORMAL HIGH (ref 0.0–0.1)
Immature Granulocytes: 2 %
Lymphocytes Absolute: 1.6 10*3/uL (ref 0.7–3.1)
Lymphs: 17 %
MCH: 29.2 pg (ref 26.6–33.0)
MCHC: 33.8 g/dL (ref 31.5–35.7)
MCV: 87 fL (ref 79–97)
Monocytes Absolute: 0.8 10*3/uL (ref 0.1–0.9)
Monocytes: 9 %
Neutrophils Absolute: 6.3 10*3/uL (ref 1.4–7.0)
Neutrophils: 65 %
Platelets: 277 10*3/uL (ref 150–450)
RBC: 4.65 x10E6/uL (ref 4.14–5.80)
RDW: 13 % (ref 11.6–15.4)
WBC: 9.6 10*3/uL (ref 3.4–10.8)

## 2021-05-11 LAB — CMP14+EGFR
ALT: 22 IU/L (ref 0–44)
AST: 17 IU/L (ref 0–40)
Albumin/Globulin Ratio: 1.9 (ref 1.2–2.2)
Albumin: 4.2 g/dL (ref 3.7–4.7)
Alkaline Phosphatase: 126 IU/L — ABNORMAL HIGH (ref 44–121)
BUN/Creatinine Ratio: 19 (ref 10–24)
BUN: 20 mg/dL (ref 8–27)
Bilirubin Total: 0.2 mg/dL (ref 0.0–1.2)
CO2: 27 mmol/L (ref 20–29)
Calcium: 10.5 mg/dL — ABNORMAL HIGH (ref 8.6–10.2)
Chloride: 101 mmol/L (ref 96–106)
Creatinine, Ser: 1.03 mg/dL (ref 0.76–1.27)
Globulin, Total: 2.2 g/dL (ref 1.5–4.5)
Glucose: 156 mg/dL — ABNORMAL HIGH (ref 70–99)
Potassium: 4.3 mmol/L (ref 3.5–5.2)
Sodium: 140 mmol/L (ref 134–144)
Total Protein: 6.4 g/dL (ref 6.0–8.5)
eGFR: 74 mL/min/{1.73_m2} (ref >59)

## 2021-05-11 LAB — LIPID PANEL
Chol/HDL Ratio: 2.3 ratio (ref 0.0–5.0)
Cholesterol, Total: 117 mg/dL (ref 100–199)
HDL: 51 mg/dL (ref >39)
LDL Chol Calc (NIH): 48 mg/dL (ref 0–99)
Triglycerides: 96 mg/dL (ref 0–149)
VLDL Cholesterol Cal: 18 mg/dL (ref 5–40)

## 2021-05-27 ENCOUNTER — Other Ambulatory Visit: Payer: Medicare PPO

## 2021-05-27 DIAGNOSIS — N401 Enlarged prostate with lower urinary tract symptoms: Secondary | ICD-10-CM

## 2021-05-28 LAB — PSA, TOTAL AND FREE
PSA, Free Pct: 5 %
PSA, Free: 0.11 ng/mL
Prostate Specific Ag, Serum: 2.2 ng/mL (ref 0.0–4.0)

## 2021-05-30 ENCOUNTER — Other Ambulatory Visit: Payer: Self-pay | Admitting: Nurse Practitioner

## 2021-06-09 ENCOUNTER — Ambulatory Visit: Payer: Medicare HMO

## 2021-06-09 DIAGNOSIS — C61 Malignant neoplasm of prostate: Secondary | ICD-10-CM | POA: Diagnosis not present

## 2021-06-22 ENCOUNTER — Ambulatory Visit (INDEPENDENT_AMBULATORY_CARE_PROVIDER_SITE_OTHER): Payer: Medicare PPO

## 2021-06-22 VITALS — Wt 184.0 lb

## 2021-06-22 DIAGNOSIS — R296 Repeated falls: Secondary | ICD-10-CM

## 2021-06-22 DIAGNOSIS — Z0000 Encounter for general adult medical examination without abnormal findings: Secondary | ICD-10-CM

## 2021-06-22 DIAGNOSIS — Z0001 Encounter for general adult medical examination with abnormal findings: Secondary | ICD-10-CM | POA: Diagnosis not present

## 2021-06-22 DIAGNOSIS — I6930 Unspecified sequelae of cerebral infarction: Secondary | ICD-10-CM

## 2021-06-22 DIAGNOSIS — Z602 Problems related to living alone: Secondary | ICD-10-CM

## 2021-06-22 NOTE — Progress Notes (Signed)
Subjective:   Rodney Hernandez is a 76 y.o. male who presents for Medicare Annual/Subsequent preventive examination.  Virtual Visit via Telephone Note  I connected with  Rodney Hernandez on 06/22/21 at 12:00 PM EDT by telephone and verified that I am speaking with the correct person using two identifiers.  Location: Patient: Home Provider: [facility] Persons participating in the virtual visit: patient/Nurse Health Advisor   I discussed the limitations, risks, security and privacy concerns of performing an evaluation and management service by telephone and the availability of in person appointments. The patient expressed understanding and agreed to proceed.  Interactive audio and video telecommunications were attempted between this nurse and patient, however failed, due to patient having technical difficulties OR patient did not have access to video capability.  We continued and completed visit with audio only.  Some vital signs may be absent or patient reported.   Hyun Reali E Jesica Goheen, LPN   Review of Systems     Cardiac Risk Factors include: advanced age (>22mn, >>76women);diabetes mellitus;dyslipidemia;hypertension;male gender;Other (see comment), Risk factor comments: hx of CVA, atherosclerosis     Objective:    Today's Vitals   06/22/21 1151 06/22/21 1152  Weight: 184 lb (83.5 kg)   PainSc:  4    Body mass index is 28.82 kg/m.     06/22/2021   12:03 PM 06/08/2020    3:42 PM 05/08/2019    1:54 PM 12/03/2018    4:00 AM 05/07/2018   11:55 AM 05/07/2018   11:54 AM 04/09/2017    3:32 PM  Advanced Directives  Does Patient Have a Medical Advance Directive? Yes Yes Yes Yes  No Yes  Type of AParamedicof APort Graham[place] will [place] will Living will;Healthcare Power of [place] Does patient want to make changes to medical advance directive?   No - Patient declined No - Patient declined      Copy of HRichtonin Chart? No - copy requested No - copy requested No - copy requested No - copy requested   No - copy requested  Would patient like information on creating a medical advance directive?     Yes (MAU/Ambulatory/Procedural Areas - Information given) No - Patient declined     Current Medications (verified) Outpatient Encounter Medications as of 06/22/2021  Medication Sig   Accu-Chek Softclix Lancets lancets Test BS up to QID Dx E11.9   albuterol (VENTOLIN HFA) 108 (90 Base) MCG/ACT inhaler TAKE 2 PUFFS BY MOUTH EVERY 6 HOURS AS NEEDED FOR WHEEZE OR SHORTNESS OF BREATH   amLODipine (NORVASC) 5 MG tablet Take 1 tablet (5 mg total) by mouth daily.   aspirin 81 MG chewable tablet Chew 81 mg by mouth daily.   atorvastatin (LIPITOR) 40 MG tablet Take 1 tablet (40 mg total) by mouth daily.   blood glucose meter kit and supplies KIT Dispense based on patient and insurance preference. Use up to four times daily as directed. (FOR ICD-9 250.00, 250.01).   cholecalciferol (VITAMIN D3) 25 MCG (1000 UNIT) tablet Take 1 tablet (1,000 Units total) by mouth daily.   clopidogrel (PLAVIX) 75 MG tablet Take 1 tablet (75 mg total) by mouth daily.   escitalopram (LEXAPRO) 20 MG tablet Take 1 tablet (20 mg total) by mouth daily.   fluticasone furoate-vilanterol (BREO ELLIPTA) 100-25 MCG/ACT AEPB Inhale 1 puff into the lungs daily.   glucose blood (ACCU-CHEK GUIDE) test strip Test BS up  to QID Dx E11.9   iron polysaccharides (NIFEREX) 150 MG capsule TAKE 1 CAPSULE BY MOUTH EVERY DAY   olmesartan (BENICAR) 40 MG tablet Take 1 tablet (40 mg total) by mouth daily.   pantoprazole (PROTONIX) 40 MG tablet Take 1 tablet (40 mg total) by mouth 2 (two) times daily.   sucralfate (CARAFATE) 1 g tablet TAKE ONE TABLET FOUR TIMES DAILY BEFORE MEALS AND AT BEDTIME   traMADol (ULTRAM) 50 MG tablet Take 1 tablet (50 mg total) by mouth 2 (two) times daily.   No facility-administered encounter  medications on file as of 06/22/2021.    Allergies (verified) Nsaids, Aspirin, Morphine and related, and Sulfa antibiotics   History: Past Medical History:  Diagnosis Date   Arthritis    psoriatic arthritis and osteo arthritis   Asthma    Cancer ([place])    Prostate   Collapse of lung    Colon polyps    DOE (dyspnea on exertion) 12/02/2018   Eczema    GERD (gastroesophageal reflux disease)    Hypertension    Seasonal allergies    Stroke ([place])    Vertigo    Past Surgical History:  Procedure Laterality Date   collapsed lung     HERNIA REPAIR     PROSTATE SURGERY     Family History  Problem Relation Age of Onset   Lung cancer Father    Prostate cancer Father    Stomach cancer Maternal Grandmother    Prostate cancer Paternal Uncle    Cervical cancer Daughter    Psoriasis Daughter    Arthritis Daughter        psoriatic arthritis   Social History   Socioeconomic History   Marital status: Widowed    Spouse name: Not on file   Number of children: 2   Years of education: 12   Highest education level: High school graduate  Occupational History   Occupation: Retired    Comment: worked in a Writer for 33 years  Tobacco Use   Smoking status: Former    Packs/day: 2.00    Years: 22.00    Total pack years: 44.00    Types: Cigarettes    Quit date: 01/09/1981    Years since quitting: 40.4   Smokeless tobacco: Never  Vaping Use   Vaping Use: Never used  Substance and Sexual Activity   Alcohol use: No   Drug use: No   Sexual activity: Not Currently  Other Topics Concern   Not on file  Social History Narrative   Widowed, 2 children.   Right handed   12th grade   3 cups daily   His 2 daughters live within half a mile from him - both help him a lot   Social Determinants of Health   Financial Resource Strain: Low Risk  (06/08/2020)   Overall Financial Resource Strain (CARDIA)    Difficulty of Paying Living Expenses: Not hard at all  Food Insecurity: No Food  Insecurity (06/08/2020)   Hunger Vital Sign    Worried About Running Out of Food in the Last Year: Never true    Ran Out of Food in the Last Year: Never true  Transportation Needs: No Transportation Needs (06/08/2020)   PRAPARE - Hydrologist (Medical): No    Lack of Transportation (Non-Medical): No  Physical Activity: Insufficiently Active (06/22/2021)   Exercise Vital Sign    Days of Exercise per Week: 7 days    Minutes of Exercise per  Session: 20 min  Stress: No Stress Concern Present (06/22/2021)   [place]    Feeling of Stress : Not at all  Social Connections: Moderately Integrated (06/22/2021)   Social Connection and Isolation Panel [NHANES]    Frequency of Communication with Friends and Family: More than three times a week    Frequency of Social Gatherings with Friends and Family: Twice a week    Attends Religious Services: More than 4 times per year    Active Member of [employer] or Organizations: Yes    Attends Archivist Meetings: More than 4 times per year    Marital Status: Widowed    Tobacco Counseling Counseling given: Not Answered   Clinical Intake:  Pre-visit preparation completed: Yes  Pain : 0-10 Pain Score: 4  Pain Type: Chronic pain Pain Location: Generalized Pain Descriptors / Indicators: Aching, Discomfort Pain Onset: More than a month ago Pain Frequency: Intermittent     BMI - recorded: 28.82 Nutritional Status: BMI 25 -29 Overweight Nutritional Risks: None Diabetes: Yes CBG done?: No Did pt. bring in CBG monitor from home?: No  How often do you need to have someone help you when you read instructions, pamphlets, or other written materials from your doctor or pharmacy?: 1 - Never  Diabetic? Nutrition Risk Assessment:  Has the patient had any N/V/D within the last 2 months?  No  Does the patient have any non-healing wounds?  No  Has the patient  had any unintentional weight loss or weight gain?  No   Diabetes:  Is the patient diabetic?  Yes  If diabetic, was a CBG obtained today?  No  Did the patient bring in their glucometer from home?  No  How often do you monitor your CBG's? never.   Financial Strains and Diabetes Management:  Are you having any financial strains with the device, your supplies or your medication? No .  Does the patient want to be seen by Chronic Care Management for management of their diabetes?  No  Would the patient like to be referred to a Nutritionist or for Diabetic Management?  No   Diabetic Exams:  Diabetic Eye Exam: Completed 05/27/2020. Overdue for diabetic eye exam. Pt has been advised about the importance in completing this exam. He has an appt coming up soon.  Diabetic Foot Exam: Completed 11/09/2020. Pt has been advised about the importance in completing this exam.   Interpreter Needed?: No  Information entered by :: Hawken Bielby, LPN   Activities of Daily Living    06/22/2021   12:05 PM  In your present state of health, do you have any difficulty performing the following activities:  Hearing? 1  Vision? 0  Difficulty concentrating or making decisions? 1  Comment mild  Walking or climbing stairs? 1  Dressing or bathing? 0  Doing errands, shopping? 1  Comment daughters drive him  Conservation officer, nature and eating ? N  Using the Toilet? N  In the past six months, have you accidently leaked urine? N  Do you have problems with loss of bowel control? N  Managing your Medications? N  Managing your Finances? N  Housekeeping or managing your Housekeeping? N    Patient Care Team: Chevis Pretty, FNP as PCP - General (Nurse Practitioner) Arnoldo Lenis, MD as PCP - Cardiology (Cardiology) Prudencio Pair as Physician Assistant (Emergency Medicine) Henreitta Leber Marisue Brooklyn, MD as Referring Physician (Urology) Andres Shad Julieta Bellini, MD as Referring Physician  (  Gastroenterology)  Indicate any recent Medical Services you may have received from other than [facility] providers in the past year (date may be approximate).     Assessment:   This is a routine wellness examination for Rodney Hernandez.  Hearing/Vision screen Hearing Screening - Comments:: C/o moderate hearing loss - declines hearing aids Vision Screening - Comments:: Wears rx glasses - up to date with routine eye exams with [facility]  Dietary issues and exercise activities discussed:     Goals Addressed             This Visit's Progress    Exercise 3x per week (30 min per time)   On track    Increase walking to 30-45 minutes at least 3 days per week.   06/22/2021 AWV Goal: Exercise for General Health  Patient will verbalize understanding of the benefits of increased physical activity: Exercising regularly is important. It will improve your overall fitness, flexibility, and endurance. Regular exercise also will improve your overall health. It can help you control your weight, reduce stress, and improve your bone density. Over the next year, patient will increase physical activity as tolerated with a goal of at least 150 minutes of moderate physical activity per week.  You can tell that you are exercising at a moderate intensity if your heart starts beating faster and you start breathing faster but can still hold a conversation. Moderate-intensity exercise ideas include: Walking 1 mile (1.6 km) in about 15 minutes Biking Hiking Golfing Dancing Water aerobics Patient will verbalize understanding of everyday activities that increase physical activity by providing examples like the following: Yard work, such as: Sales promotion account executive Gardening Washing windows or floors Patient will be able to explain general safety guidelines for exercising:  Before you start a new exercise program, talk with your health  care provider. Do not exercise so much that you hurt yourself, feel dizzy, or get very short of breath. Wear comfortable clothes and wear shoes with good support. Drink plenty of water while you exercise to prevent dehydration or heat stroke. Work out until your breathing and your heartbeat get faster.      Have 3 meals a day   On track    06/22/2021 AWV Goal: Improved Nutrition/Diet  Patient will verbalize understanding that diet plays an important role in overall health and that a poor diet is a risk factor for many chronic medical conditions.  Over the next year, patient will improve self management of their diet by incorporating better variety, improved meal pattern, more consistent meal timing, increased physical activity, better food choices, watch portion sizes/amount of food eaten at one time, and eat 6 small meals per day. Patient will utilize available community resources to help with food acquisition if needed (ex: food pantries, [address], etc) Patient will work with nutrition specialist if a referral was made        Depression Screen    06/22/2021   12:01 PM 05/10/2021    2:19 PM 02/10/2021    2:24 PM 11/09/2020    2:00 PM 08/09/2020   12:18 PM 06/08/2020    3:51 PM 06/08/2020    3:48 PM  PHQ 2/9 Scores  PHQ - 2 Score '2 2 2 1 2 ' 0 0  PHQ- 9 Score '7 7 8 11 10      ' Fall Risk    06/22/2021   11:53 AM 05/10/2021    2:19 PM 02/10/2021  2:24 PM 11/09/2020    2:00 PM 08/09/2020   12:17 PM  Fall Risk   Falls in the past year? 1 1 0 0 1  Number falls in past yr: 1 0   1  Injury with Fall? 0 0   0  Risk for fall due to : History of fall(s);Orthopedic patient;Impaired balance/gait History of fall(s)   History of fall(s)  Follow up Falls prevention discussed;Education provided Education provided   Falls prevention discussed    FALL RISK PREVENTION PERTAINING TO THE HOME:  Any stairs in or around the home? Yes  If so, are there any without handrails? No  Home free of loose throw rugs  in walkways, pet beds, electrical cords, etc? Yes  Adequate lighting in your home to reduce risk of falls? Yes   ASSISTIVE DEVICES UTILIZED TO PREVENT FALLS:  Life alert? No  Use of a cane, walker or w/c? Yes  Grab bars in the bathroom? Yes  Shower chair or bench in shower? No  Elevated toilet seat or a handicapped toilet? Yes   TIMED UP AND GO:  Was the test performed? No . Telephonic visit  Cognitive Function:    04/10/2017    2:58 PM  MMSE - Mini Mental State Exam  Orientation to time 5  Orientation to Place 5  Registration 3  Attention/ Calculation 5  Recall 1  Language- name 2 objects 2  Language- repeat 1  Language- follow 3 step command 3  Language- read & follow direction 1  Write a sentence 1  Copy design 1  Total score 28        06/08/2020    3:42 PM 05/08/2019    2:00 PM 05/07/2018   11:55 AM  6CIT Screen  What Year? 0 points 0 points 0 points  What month? 0 points 0 points 0 points  What time? 0 points 0 points 0 points  Count back from 20 0 points 0 points 0 points  Months in reverse 0 points 0 points 0 points  Repeat phrase 0 points 0 points 0 points  Total Score 0 points 0 points 0 points    Immunizations Immunization History  Administered Date(s) Administered   Fluad Quad(high Dose 65+) 10/11/2018, 11/06/2019, 10/21/2020   Influenza, High Dose Seasonal PF 10/20/2013, 10/11/2015, 10/11/2016, 11/09/2017   Influenza,inj,Quad PF,6+ Mos 10/14/2014   Pneumococcal Conjugate-13 03/02/2014   Pneumococcal Polysaccharide-23 11/10/2011   Td 03/02/2014   Zoster Recombinat (Shingrix) 05/10/2021    TDAP status: Up to date  Flu Vaccine status: Up to date  Pneumococcal vaccine status: Up to date  Covid-19 vaccine status: Declined, Education has been provided regarding the importance of this vaccine but patient still declined. Advised may receive this vaccine at local pharmacy or Health Dept.or vaccine clinic. Aware to provide a copy of the vaccination  record if obtained from local pharmacy or Health Dept. Verbalized acceptance and understanding.  Qualifies for Shingles Vaccine? Yes   Zostavax completed Yes   Shingrix Completed?: No.    Education has been provided regarding the importance of this vaccine. Patient has been advised to call insurance company to determine out of pocket expense if they have not yet received this vaccine. Advised may also receive vaccine at local pharmacy or Health Dept. Verbalized acceptance and understanding.  Screening Tests Health Maintenance  Topic Date Due   OPHTHALMOLOGY EXAM  05/27/2021   COLONOSCOPY (Pts 45-75yr Insurance coverage will need to be confirmed)  02/10/2022 (Originally 09/05/2020)   Zoster Vaccines-  Shingrix (2 of 2) 07/05/2021   INFLUENZA VACCINE  08/09/2021   FOOT EXAM  11/09/2021   HEMOGLOBIN A1C  11/10/2021   TETANUS/TDAP  03/02/2024   Pneumonia Vaccine 41+ Years old  Completed   Hepatitis C Screening  Completed   HPV VACCINES  Aged Out   COVID-19 Vaccine  Discontinued    Health Maintenance  Health Maintenance Due  Topic Date Due   OPHTHALMOLOGY EXAM  05/27/2021    Colorectal cancer screening: No longer required.  He was supposed to repeat this last fall, but he declined - he is still considering  Lung Cancer Screening: (Low Dose CT Chest recommended if Age 70-80 years, 30 pack-year currently smoking OR have quit w/in 15years.) does not qualify.   Additional Screening:  Hepatitis C Screening: does qualify; Completed 05/13/2015  Vision Screening: Recommended annual ophthalmology exams for early detection of glaucoma and other disorders of the eye. Is the patient up to date with their annual eye exam?  Yes  Who is the provider or what is the name of the office in which the patient attends annual eye exams? [facility] If pt is not established with a provider, would they like to be referred to a provider to establish care? No .   Dental Screening: Recommended annual  dental exams for proper oral hygiene  Community Resource Referral / Chronic Care Management: CRR required this visit?  No   CCM required this visit?  No      Plan:     I have personally reviewed and noted the following in the patient's chart:   Medical and social history Use of alcohol, tobacco or illicit drugs  Current medications and supplements including opioid prescriptions. Patient is currently taking opioid prescriptions. Information provided to patient regarding non-opioid alternatives. Patient advised to discuss non-opioid treatment plan with their provider. Functional ability and status Nutritional status Physical activity Advanced directives List of other physicians Hospitalizations, surgeries, and ER visits in previous 12 months Vitals Screenings to include cognitive, depression, and falls Referrals and appointments  In addition, I have reviewed and discussed with patient certain preventive protocols, quality metrics, and best practice recommendations. A written personalized care plan for preventive services as well as general preventive health recommendations were provided to patient.     Sandrea Hammond, LPN   0/72/1828   Nurse Notes: Feels bloated all the time, snacks all the time - cannot feel fulfilled - advised using Breo in the am, give melatonin another try.

## 2021-06-22 NOTE — Patient Instructions (Addendum)
[name] , Thank you for taking time to come for your Medicare Wellness Visit. I appreciate your ongoing commitment to your health goals. Please review the following plan we discussed and let me know if I can assist you in the future.   Screening recommendations/referrals: Colonoscopy: Done 09/05/2017 - declines repeat Recommended yearly ophthalmology/optometry visit for glaucoma screening and checkup Recommended yearly dental visit for hygiene and checkup  Vaccinations: Influenza vaccine: Done 10/21/2020 - Repeat annually Pneumococcal vaccine: Done 11/10/2011 & 03/02/2014 Tdap vaccine: Done 03/02/2014 - Repeat in 10 years Shingles vaccine: Done 05/10/2021 - get second dose at next visit   Covid-19: Declined  Advanced directives: Please bring a copy of your health care power of attorney and living will to the office to be added to your chart at your convenience.   Conditions/risks identified: Aim for 30 minutes of exercise or brisk walking, 6-8 glasses of water, and 5 servings of fruits and vegetables each day. Check out the card counting tips and tips for managing joint pain in this summary.  Next appointment: Follow up in one year for your annual wellness visit.   Preventive Care 78 Years and Older, Male  Preventive care refers to lifestyle choices and visits with your health care provider that can promote health and wellness. What does preventive care include? A yearly physical exam. This is also called an annual well check. Dental exams once or twice a year. Routine eye exams. Ask your health care provider how often you should have your eyes checked. Personal lifestyle choices, including: Daily care of your teeth and gums. Regular physical activity. Eating a healthy diet. Avoiding tobacco and drug use. Limiting alcohol use. Practicing safe sex. Taking low doses of aspirin every day. Taking vitamin and mineral supplements as recommended by your health care provider. What happens  during an annual well check? The services and screenings done by your health care provider during your annual well check will depend on your age, overall health, lifestyle risk factors, and family history of disease. Counseling  Your health care provider may ask you questions about your: Alcohol use. Tobacco use. Drug use. Emotional well-being. Home and relationship well-being. Sexual activity. Eating habits. History of falls. Memory and ability to understand (cognition). Work and work Statistician. Screening  You may have the following tests or measurements: Height, weight, and BMI. Blood pressure. Lipid and cholesterol levels. These may be checked every 5 years, or more frequently if you are over 78 years old. Skin check. Lung cancer screening. You may have this screening every year starting at age 78 if you have a 30-pack-year history of smoking and currently smoke or have quit within the past 15 years. Fecal occult blood test (FOBT) of the stool. You may have this test every year starting at age 78. Flexible sigmoidoscopy or colonoscopy. You may have a sigmoidoscopy every 5 years or a colonoscopy every 10 years starting at age 78. Prostate cancer screening. Recommendations will vary depending on your family history and other risks. Hepatitis C blood test. Hepatitis B blood test. Sexually transmitted disease (STD) testing. Diabetes screening. This is done by checking your blood sugar (glucose) after you have not eaten for a while (fasting). You may have this done every 1-3 years. Abdominal aortic aneurysm (AAA) screening. You may need this if you are a current or former smoker. Osteoporosis. You may be screened starting at age 78 if you are at high risk. Talk with your health care provider about your test results, treatment options,  and if necessary, the need for more tests. Vaccines  Your health care provider may recommend certain vaccines, such as: Influenza vaccine. This is  recommended every year. Tetanus, diphtheria, and acellular pertussis (Tdap, Td) vaccine. You may need a Td booster every 10 years. Zoster vaccine. You may need this after age 50. Pneumococcal 13-valent conjugate (PCV13) vaccine. One dose is recommended after age 78. Pneumococcal polysaccharide (PPSV23) vaccine. One dose is recommended after age 72. Talk to your health care provider about which screenings and vaccines you need and how often you need them. This information is not intended to replace advice given to you by your health care provider. Make sure you discuss any questions you have with your health care provider. Document Released: 01/22/2015 Document Revised: 09/15/2015 Document Reviewed: 10/27/2014 Elsevier Interactive Patient Education  2017 [place] Prevention in the Home Falls can cause injuries. They can happen to people of all ages. There are many things you can do to make your home safe and to help prevent falls. What can I do on the outside of my home? Regularly fix the edges of walkways and driveways and fix any cracks. Remove anything that might make you trip as you walk through a door, such as a raised step or threshold. Trim any bushes or trees on the path to your home. Use bright outdoor lighting. Clear any walking paths of anything that might make someone trip, such as rocks or tools. Regularly check to see if handrails are loose or broken. Make sure that both sides of any steps have handrails. Any raised decks and porches should have guardrails on the edges. Have any leaves, snow, or ice cleared regularly. Use sand or salt on walking paths during winter. Clean up any spills in your garage right away. This includes oil or grease spills. What can I do in the bathroom? Use night lights. Install grab bars by the toilet and in the tub and shower. Do not use towel bars as grab bars. Use non-skid mats or decals in the tub or shower. If you need to sit down in  the shower, use a plastic, non-slip stool. Keep the floor dry. Clean up any water that spills on the floor as soon as it happens. Remove soap buildup in the tub or shower regularly. Attach bath mats securely with double-sided non-slip rug tape. Do not have throw rugs and other things on the floor that can make you trip. What can I do in the bedroom? Use night lights. Make sure that you have a light by your bed that is easy to reach. Do not use any sheets or blankets that are too big for your bed. They should not hang down onto the floor. Have a firm chair that has side arms. You can use this for support while you get dressed. Do not have throw rugs and other things on the floor that can make you trip. What can I do in the kitchen? Clean up any spills right away. Avoid walking on wet floors. Keep items that you use a lot in easy-to-reach places. If you need to reach something above you, use a strong step stool that has a grab bar. Keep electrical cords out of the way. Do not use floor polish or wax that makes floors slippery. If you must use wax, use non-skid floor wax. Do not have throw rugs and other things on the floor that can make you trip. What can I do with my stairs? Do not leave  any items on the stairs. Make sure that there are handrails on both sides of the stairs and use them. Fix handrails that are broken or loose. Make sure that handrails are as long as the stairways. Check any carpeting to make sure that it is firmly attached to the stairs. Fix any carpet that is loose or worn. Avoid having throw rugs at the top or bottom of the stairs. If you do have throw rugs, attach them to the floor with carpet tape. Make sure that you have a light switch at the top of the stairs and the bottom of the stairs. If you do not have them, ask someone to add them for you. What else can I do to help prevent falls? Wear shoes that: Do not have high heels. Have rubber bottoms. Are comfortable  and fit you well. Are closed at the toe. Do not wear sandals. If you use a stepladder: Make sure that it is fully opened. Do not climb a closed stepladder. Make sure that both sides of the stepladder are locked into place. Ask someone to hold it for you, if possible. Clearly mark and make sure that you can see: Any grab bars or handrails. First and last steps. Where the edge of each step is. Use tools that help you move around (mobility aids) if they are needed. These include: Canes. Walkers. Scooters. Crutches. Turn on the lights when you go into a dark area. Replace any light bulbs as soon as they burn out. Set up your furniture so you have a clear path. Avoid moving your furniture around. If any of your floors are uneven, fix them. If there are any pets around you, be aware of where they are. Review your medicines with your doctor. Some medicines can make you feel dizzy. This can increase your chance of falling. Ask your doctor what other things that you can do to help prevent falls. This information is not intended to replace advice given to you by your health care provider. Make sure you discuss any questions you have with your health care provider. Document Released: 10/22/2008 Document Revised: 06/03/2015 Document Reviewed: 01/30/2014 Elsevier Interactive Patient Education  2017 [place] for Diabetes Mellitus, Adult Carbohydrate counting is a method of keeping track of how many carbohydrates you eat. Eating carbohydrates increases the amount of sugar (glucose) in the blood. Counting how many carbohydrates you eat improves how well you manage your blood glucose. This, in turn, helps you manage your diabetes. Carbohydrates are measured in grams (g) per serving. It is important to know how many carbohydrates (in grams or by serving size) you can have in each meal. This is different for every person. A dietitian can help you make a meal plan and calculate  how many carbohydrates you should have at each meal and snack. What foods contain carbohydrates? Carbohydrates are found in the following foods: Grains, such as breads and cereals. Dried beans and soy products. Starchy vegetables, such as potatoes, peas, and corn. Fruit and fruit juices. Milk and yogurt. Sweets and snack foods, such as cake, cookies, candy, chips, and soft drinks. How do I count carbohydrates in foods? There are two ways to count carbohydrates in food. You can read food labels or learn standard serving sizes of foods. You can use either of these methods or a combination of both. Using the Nutrition Facts label The Nutrition Facts list is included on the labels of almost all packaged foods and beverages in the  [place]. It includes: The serving size. Information about nutrients in each serving, including the grams of carbohydrate per serving. To use the Nutrition Facts, decide how many servings you will have. Then, multiply the number of servings by the number of carbohydrates per serving. The resulting number is the total grams of carbohydrates that you will be having. Learning the standard serving sizes of foods When you eat carbohydrate foods that are not packaged or do not include Nutrition Facts on the label, you need to measure the servings in order to count the grams of carbohydrates. Measure the foods that you will eat with a food scale or measuring cup, if needed. Decide how many standard-size servings you will eat. Multiply the number of servings by 15. For foods that contain carbohydrates, one serving equals 15 g of carbohydrates. For example, if you eat 2 cups or 10 oz (300 g) of strawberries, you will have eaten 2 servings and 30 g of carbohydrates (2 servings x 15 g = 30 g). For foods that have more than one food mixed, such as soups and casseroles, you must count the carbohydrates in each food that is included. The following list contains standard serving  sizes of common carbohydrate-rich foods. Each of these servings has about 15 g of carbohydrates: 1 slice of bread. 1 six-inch (15 cm) tortilla. ? cup or 2 oz (53 g) cooked rice or pasta.  cup or 3 oz (85 g) cooked or canned, drained and rinsed beans or lentils.  cup or 3 oz (85 g) starchy vegetable, such as peas, corn, or squash.  cup or 4 oz (120 g) hot cereal.  cup or 3 oz (85 g) boiled or mashed potatoes, or  or 3 oz (85 g) of a large baked potato.  cup or 4 fl oz (118 mL) fruit juice. 1 cup or 8 fl oz (237 mL) milk. 1 small or 4 oz (106 g) apple.  or 2 oz (63 g) of a medium banana. 1 cup or 5 oz (150 g) strawberries. 3 cups or 1 oz (28.3 g) popped popcorn. What is an example of carbohydrate counting? To calculate the grams of carbohydrates in this sample meal, follow the steps shown below. Sample meal 3 oz (85 g) chicken breast. ? cup or 4 oz (106 g) brown rice.  cup or 3 oz (85 g) corn. 1 cup or 8 fl oz (237 mL) milk. 1 cup or 5 oz (150 g) strawberries with sugar-free whipped topping. Carbohydrate calculation Identify the foods that contain carbohydrates: Rice. Corn. Milk. Strawberries. Calculate how many servings you have of each food: 2 servings rice. 1 serving corn. 1 serving milk. 1 serving strawberries. Multiply each number of servings by 15 g: 2 servings rice x 15 g = 30 g. 1 serving corn x 15 g = 15 g. 1 serving milk x 15 g = 15 g. 1 serving strawberries x 15 g = 15 g. Add together all of the amounts to find the total grams of carbohydrates eaten: 30 g + 15 g + 15 g + 15 g = 75 g of carbohydrates total. What are tips for following this plan? Shopping Develop a meal plan and then make a shopping list. Buy fresh and frozen vegetables, fresh and frozen fruit, dairy, eggs, beans, lentils, and whole grains. Look at food labels. Choose foods that have more fiber and less sugar. Avoid processed foods and foods with added sugars. Meal planning Aim to have  the same number of grams  of carbohydrates at each meal and for each snack time. Plan to have regular, balanced meals and snacks. Where to find more information American Diabetes Association: diabetes.org Centers for Disease Control and Prevention: StoreMirror.com.[place] Academy of Nutrition and Dietetics: eatright.org Association of Diabetes Care & Education Specialists: diabeteseducator.org Summary Carbohydrate counting is a method of keeping track of how many carbohydrates you eat. Eating carbohydrates increases the amount of sugar (glucose) in your blood. Counting how many carbohydrates you eat improves how well you manage your blood glucose. This helps you manage your diabetes. A dietitian can help you make a meal plan and calculate how many carbohydrates you should have at each meal and snack. This information is not intended to replace advice given to you by your health care provider. Make sure you discuss any questions you have with your health care provider. Document Revised: 07/30/2019 Document Reviewed: 07/30/2019 Elsevier Patient Education  [place].  Joint Pain  Joint pain can be caused by many things. It is likely to go away if you follow instructions from your doctor for taking care of yourself at home. Sometimes, you may need more treatment. Follow these instructions at home: Managing pain, stiffness, and swelling     If told, put ice on the painful area. To do this: If you have a removable elastic bandage, sling, or splint, take it off as told by your doctor. Put ice in a plastic bag. Place a towel between your skin and the bag. Leave the ice on for 20 minutes, 2-3 times a day. Take off the ice if your skin turns bright red. This is very important. If you cannot feel pain, heat, or cold, you have a greater risk of damage to the area. Move your fingers or toes below the painful joint often. Raise the painful joint above the level of your heart while you are sitting or lying  down. If told, put heat on the painful area. Do this as often as told by your doctor. Use the heat source that your doctor recommends, such as a moist heat pack or a heating pad. Place a towel between your skin and the heat source. Leave the heat on for 20-30 minutes. Take off the heat if your skin gets bright red. This is especially important if you are unable to feel pain, heat, or cold. You may have a greater risk of getting burned. Activity Rest the painful joint for as long as told by your doctor. Do not do things that cause pain or make your pain worse. Begin exercising or stretching the affected area, as told by your doctor. Ask your doctor what types of exercise are safe for you. Return to your normal activities when your doctor says that it is safe. If you have an elastic bandage, sling, or splint: Wear it as told by your doctor. Take it only as told by your doctor. Loosen it your fingers or toes below the joint: Tingle. Become numb. Get cold and blue. Keep it clean. Ask your doctor if you should take it off before bathing. If it is not waterproof: Do not let it get wet. Cover it with a watertight covering when you take a bath or shower. General instructions Take over-the-counter and prescription medicines only as told by your doctor. This may include medicines taken by mouth or applied to the skin. Do not smoke or use any products that contain nicotine or tobacco. If you need help quitting, ask your doctor. Keep all follow-up visits as told  by your doctor. This is important. Contact a doctor if: You have pain that gets worse and does not get better with medicine. Your joint pain does not get better in 3 days. You have more bruising or swelling. You have a fever. You lose 10 lb (4.5 kg) or more without trying. Get help right away if: You cannot move the joint. Your fingers or toes tingle, become numb. or get cold and blue. You have a fever along with a joint that is red,  warm, and swollen. Summary Joint pain can be caused by many things. It often goes away if you follow instructions from your doctor for taking care of yourself at home. Rest the painful joint for as long as told. Do not do things that cause pain or make your pain worse. Take over-the-counter and prescription medicines only as told by your doctor. This information is not intended to replace advice given to you by your health care provider. Make sure you discuss any questions you have with your health care provider. Document Revised: 04/09/2019 Document Reviewed: 04/09/2019 Elsevier Patient Education  [place].

## 2021-06-23 ENCOUNTER — Telehealth: Payer: Self-pay

## 2021-06-23 NOTE — Telephone Encounter (Signed)
   Telephone encounter was:  Successful.  06/23/2021 Name: Rodney Hernandez MRN: [id number] DOB: 24-Apr-1945  Rodney Hernandez is a 76 y.o. year old male who is a primary care patient of [facility], [facility], [facility] . The community resource team was consulted for assistance with  life alert.  Care guide performed the following interventions: Spoke with patient about Med Alert. Patient requested that the brochure be mailed to his home address. Letter saved in Epic.  Follow Up Plan:  Care guide will follow up with patient by phone over the next 7 days.  Rodney Hernandez, AAS Paralegal, [facility]  [address], [place] [address] ??[email or web address]  ?? [phone]   [email or web address]

## 2021-07-01 ENCOUNTER — Telehealth: Payer: Self-pay

## 2021-07-18 LAB — HM DIABETES EYE EXAM

## 2021-08-11 ENCOUNTER — Ambulatory Visit (INDEPENDENT_AMBULATORY_CARE_PROVIDER_SITE_OTHER): Payer: Medicare PPO | Admitting: Nurse Practitioner

## 2021-08-11 ENCOUNTER — Encounter: Payer: Self-pay | Admitting: Nurse Practitioner

## 2021-08-11 VITALS — BP 129/71 | HR 66 | Temp 97.5°F | Resp 20 | Ht 67.0 in | Wt 184.0 lb

## 2021-08-11 DIAGNOSIS — N401 Enlarged prostate with lower urinary tract symptoms: Secondary | ICD-10-CM | POA: Diagnosis not present

## 2021-08-11 DIAGNOSIS — R42 Dizziness and giddiness: Secondary | ICD-10-CM

## 2021-08-11 DIAGNOSIS — I700 Atherosclerosis of aorta: Secondary | ICD-10-CM | POA: Diagnosis not present

## 2021-08-11 DIAGNOSIS — R972 Elevated prostate specific antigen [PSA]: Secondary | ICD-10-CM | POA: Diagnosis not present

## 2021-08-11 DIAGNOSIS — K219 Gastro-esophageal reflux disease without esophagitis: Secondary | ICD-10-CM

## 2021-08-11 DIAGNOSIS — D509 Iron deficiency anemia, unspecified: Secondary | ICD-10-CM

## 2021-08-11 DIAGNOSIS — Z23 Encounter for immunization: Secondary | ICD-10-CM

## 2021-08-11 DIAGNOSIS — I10 Essential (primary) hypertension: Secondary | ICD-10-CM | POA: Diagnosis not present

## 2021-08-11 DIAGNOSIS — R609 Edema, unspecified: Secondary | ICD-10-CM | POA: Diagnosis not present

## 2021-08-11 DIAGNOSIS — E785 Hyperlipidemia, unspecified: Secondary | ICD-10-CM

## 2021-08-11 DIAGNOSIS — I63239 Cerebral infarction due to unspecified occlusion or stenosis of unspecified carotid arteries: Secondary | ICD-10-CM

## 2021-08-11 DIAGNOSIS — J4520 Mild intermittent asthma, uncomplicated: Secondary | ICD-10-CM

## 2021-08-11 DIAGNOSIS — I6930 Unspecified sequelae of cerebral infarction: Secondary | ICD-10-CM

## 2021-08-11 DIAGNOSIS — F321 Major depressive disorder, single episode, moderate: Secondary | ICD-10-CM

## 2021-08-11 DIAGNOSIS — E119 Type 2 diabetes mellitus without complications: Secondary | ICD-10-CM

## 2021-08-11 DIAGNOSIS — L4050 Arthropathic psoriasis, unspecified: Secondary | ICD-10-CM

## 2021-08-11 LAB — BAYER DCA HB A1C WAIVED: HB A1C (BAYER DCA - WAIVED): 7.8 % — ABNORMAL HIGH (ref 4.8–5.6)

## 2021-08-11 MED ORDER — ESCITALOPRAM OXALATE 20 MG PO TABS: 20.0000 mg | ORAL_TABLET | Freq: Every day | ORAL | 1 refills | Status: DC

## 2021-08-11 MED ORDER — PANTOPRAZOLE SODIUM 40 MG PO TBEC: 40.0000 mg | DELAYED_RELEASE_TABLET | Freq: Two times a day (BID) | ORAL | 1 refills | Status: DC

## 2021-08-11 MED ORDER — OLMESARTAN MEDOXOMIL 40 MG PO TABS: 40.0000 mg | ORAL_TABLET | Freq: Every day | ORAL | 1 refills | Status: DC

## 2021-08-11 MED ORDER — METFORMIN HCL 500 MG PO TABS: 500.0000 mg | ORAL_TABLET | Freq: Two times a day (BID) | ORAL | 3 refills | Status: DC

## 2021-08-11 MED ORDER — ATORVASTATIN CALCIUM 40 MG PO TABS
40.0000 mg | ORAL_TABLET | Freq: Every day | ORAL | 1 refills | Status: DC
Start: 2021-08-11 — End: 2022-02-13

## 2021-08-11 MED ORDER — CLOPIDOGREL BISULFATE 75 MG PO TABS: 75.0000 mg | ORAL_TABLET | Freq: Every day | ORAL | 1 refills | Status: DC

## 2021-08-11 MED ORDER — TRAMADOL HCL 50 MG PO TABS: 50.0000 mg | ORAL_TABLET | Freq: Two times a day (BID) | ORAL | 2 refills | Status: DC

## 2021-08-11 MED ORDER — POLYSACCHARIDE IRON COMPLEX 150 MG PO CAPS: ORAL_CAPSULE | ORAL | 1 refills | Status: DC

## 2021-08-11 MED ORDER — FLUTICASONE FUROATE-VILANTEROL 100-25 MCG/ACT IN AEPB
1.0000 | INHALATION_SPRAY | Freq: Every day | RESPIRATORY_TRACT | 11 refills | Status: DC
Start: 2021-08-11 — End: 2022-02-13

## 2021-08-11 MED ORDER — AMLODIPINE BESYLATE 5 MG PO TABS: 5.0000 mg | ORAL_TABLET | Freq: Every day | ORAL | 1 refills | Status: DC

## 2021-08-11 NOTE — Progress Notes (Signed)
Subjective:    Patient ID: Rodney Hernandez, male    DOB: 09-Dec-1945, 76 y.o.   MRN: [id number]   Chief Complaint: medical management of chronic issues     HPI:  Rodney Hernandez is a 76 y.o. who identifies as a male who was assigned male at birth.   Social history: Lives with: by hisself- his daughters check on him daily Work history: retired   Scientist, forensic in today for follow up of the following chronic medical issues:  1. Essential hypertension No c/o chest pain,  or headache. Does not check blood pressure at home very often. BP Readings from Last 3 Encounters:  05/10/21 139/62  04/07/21 136/66  02/10/21 119/63     2. Carotid stenosis, symptomatic, with infarction [facility]) Has had endarectomy in the past. Is doing well. Hs occasional dizzy spells when he goes from sitting to standing.  3. Aortic atherosclerosis ([place]) Has occasional SOB. [place];ly when he is excited about something  4. Hyperlipidemia with target LDL less than 100 Does try to watch diet but does no dedicated exercise Lab Results  Component Value Date   CHOL 117 05/10/2021   HDL 51 05/10/2021   LDLCALC 48 05/10/2021   TRIG 96 05/10/2021   CHOLHDL 2.3 05/10/2021     5. Diabetes mellitus without complication ([place]) He does not check his blood sugars at home.  Lab Results  Component Value Date   HGBA1C 7.6 (H) 05/10/2021     6. Gastroesophageal reflux disease without esophagitis Is on protonix daily which works well to keep symptoms under control  7. Benign prostatic hyperplasia with lower urinary tract symptoms, symptom details unspecified No voiding issues Lab Results  Component Value Date   PSA1 2.2 05/27/2021   PSA1 4.9 (H) 02/10/2021   PSA1 21.1 (H) 10/01/2020      8. Peripheral edema Ha some by the end of the day often  9. Iron deficiency anemia, unspecified iron deficiency anemia type No c/o fatigue Lab Results  Component Value Date   HGB 13.6 05/10/2021     10. Elevated prostate specific  antigen (PSA) No voiding issues Lab Results  Component Value Date   PSA1 2.2 05/27/2021   PSA1 4.9 (H) 02/10/2021   PSA1 21.1 (H) 10/01/2020      11. Late effect of cerebrovascular accident (CVA) No permanent issues   New complaints: Has been having dizzy spells when he goes from sitting to standing and when he is turning to move.   Allergies  Allergen Reactions   Nsaids Shortness Of Breath   Aspirin Other (See Comments)    Affects patient breathing   Morphine And Related Hives and Itching   Sulfa Antibiotics    Outpatient Encounter Medications as of 08/11/2021  Medication Sig   Accu-Chek Softclix Lancets lancets Test BS up to QID Dx E11.9   albuterol (VENTOLIN HFA) 108 (90 Base) MCG/ACT inhaler TAKE 2 PUFFS BY MOUTH EVERY 6 HOURS AS NEEDED FOR WHEEZE OR SHORTNESS OF BREATH   amLODipine (NORVASC) 5 MG tablet Take 1 tablet (5 mg total) by mouth daily.   aspirin 81 MG chewable tablet Chew 81 mg by mouth daily.   atorvastatin (LIPITOR) 40 MG tablet Take 1 tablet (40 mg total) by mouth daily.   blood glucose meter kit and supplies KIT Dispense based on patient and insurance preference. Use up to four times daily as directed. (FOR ICD-9 250.00, 250.01).   cholecalciferol (VITAMIN D3) 25 MCG (1000 UNIT) tablet Take 1 tablet (1,000 Units total)  by mouth daily.   clopidogrel (PLAVIX) 75 MG tablet Take 1 tablet (75 mg total) by mouth daily.   escitalopram (LEXAPRO) 20 MG tablet Take 1 tablet (20 mg total) by mouth daily.   fluticasone furoate-vilanterol (BREO ELLIPTA) 100-25 MCG/ACT AEPB Inhale 1 puff into the lungs daily.   glucose blood (ACCU-CHEK GUIDE) test strip Test BS up to QID Dx E11.9   iron polysaccharides (NIFEREX) 150 MG capsule TAKE 1 CAPSULE BY MOUTH EVERY DAY   olmesartan (BENICAR) 40 MG tablet Take 1 tablet (40 mg total) by mouth daily.   pantoprazole (PROTONIX) 40 MG tablet Take 1 tablet (40 mg total) by mouth 2 (two) times daily.   sucralfate (CARAFATE) 1 g tablet  TAKE ONE TABLET FOUR TIMES DAILY BEFORE MEALS AND AT BEDTIME   traMADol (ULTRAM) 50 MG tablet Take 1 tablet (50 mg total) by mouth 2 (two) times daily.   No facility-administered encounter medications on file as of 08/11/2021.    Past Surgical History:  Procedure Laterality Date   collapsed lung     HERNIA REPAIR     PROSTATE SURGERY      Family History  Problem Relation Age of Onset   Lung cancer Father    Prostate cancer Father    Stomach cancer Maternal Grandmother    Prostate cancer Paternal Uncle    Cervical cancer Daughter    Psoriasis Daughter    Arthritis Daughter        psoriatic arthritis      Controlled substance contract: n/a     Review of Systems  Constitutional:  Negative for diaphoresis.  Eyes:  Negative for pain.  Respiratory:  Negative for shortness of breath.   Cardiovascular:  Negative for chest pain, palpitations and leg swelling.  Gastrointestinal:  Negative for abdominal pain.  Endocrine: Negative for polydipsia.  Skin:  Negative for rash.  Neurological:  Positive for dizziness. Negative for weakness and headaches.  Hematological:  Does not bruise/bleed easily.  All other systems reviewed and are negative.      Objective:   Physical Exam Vitals and nursing note reviewed.  Constitutional:      Appearance: Normal appearance. He is well-developed.  HENT:     Head: Normocephalic.     Nose: Nose normal.     Mouth/Throat:     Mouth: Mucous membranes are moist.     Pharynx: Oropharynx is clear.  Eyes:     Pupils: Pupils are equal, round, and reactive to light.  Neck:     Thyroid: No thyroid mass or thyromegaly.     Vascular: No carotid bruit or JVD.     Trachea: Phonation normal.  Cardiovascular:     Rate and Rhythm: Normal rate and regular rhythm.  Pulmonary:     Effort: Pulmonary effort is normal. No respiratory distress.     Breath sounds: Normal breath sounds.  Abdominal:     General: Bowel sounds are normal.     Palpations:  Abdomen is soft.     Tenderness: There is no abdominal tenderness.  Musculoskeletal:        General: Normal range of motion.     Cervical back: Normal range of motion and neck supple.  Lymphadenopathy:     Cervical: No cervical adenopathy.  Skin:    General: Skin is warm and dry.  Neurological:     General: No focal deficit present.     Mental Status: He is alert and oriented to person, place, and time.  Cranial Nerves: No cranial nerve deficit.     Sensory: No sensory deficit.  Psychiatric:        Behavior: Behavior normal.        Thought Content: Thought content normal.        Judgment: Judgment normal.    BP 129/71   Pulse 66   Temp (!) 97.5 F (36.4 C) (Temporal)   Resp 20   Ht '5\' 7"'  (1.702 m)   Wt 184 lb (83.5 kg)   SpO2 94%   BMI 28.82 kg/m   HGBA1c 7.8%      Assessment & Plan:  Usbaldo Pannone comes in today with chief complaint of Medical Management of Chronic Issues (Recent falls)   Diagnosis and orders addressed:  1. Essential hypertension Low sodium diet - CBC with Differential/Platelet - CMP14+EGFR - amLODipine (NORVASC) 5 MG tablet; Take 1 tablet (5 mg total) by mouth daily.  Dispense: 90 tablet; Refill: 1 - olmesartan (BENICAR) 40 MG tablet; Take 1 tablet (40 mg total) by mouth daily.  Dispense: 90 tablet; Refill: 1  2. Carotid stenosis, symptomatic, with infarction [facility]) Referral to neurology  3. Aortic atherosclerosis ([place])  4. Hyperlipidemia with target LDL less than 100 Low fat diet - Lipid panel - atorvastatin (LIPITOR) 40 MG tablet; Take 1 tablet (40 mg total) by mouth daily.  Dispense: 90 tablet; Refill: 1  5. Diabetes mellitus without complication ([place]) Stricter carb counting Add metformin 557m bid  - Microalbumin / creatinine urine ratio - Bayer DCA Hb A1c Waived  6. Gastroesophageal reflux disease without esophagitis Avoid spicy foods Do not eat 2 hours prior to bedtime - pantoprazole (PROTONIX) 40 MG tablet; Take 1 tablet (40  mg total) by mouth 2 (two) times daily.  Dispense: 180 tablet; Refill: 1  7. Benign prostatic hyperplasia with lower urinary tract symptoms, symptom details unspecified Report any issues with voiding  8. Peripheral edema Elevate legs when sitting  9. Iron deficiency anemia, unspecified iron deficiency anemia type Labs pending - iron polysaccharides (NIFEREX) 150 MG capsule; TAKE 1 CAPSULE BY MOUTH EVERY DAY  Dispense: 90 capsule; Refill: 1  10. Elevated prostate specific antigen (PSA)  11. Late effect of cerebrovascular accident (CVA) - clopidogrel (PLAVIX) 75 MG tablet; Take 1 tablet (75 mg total) by mouth daily.  Dispense: 90 tablet; Refill: 1  12. Dizziness Keep diary of episodes - Ambulatory referral to Neurology  13. Depression, major, single episode, moderate (HCC) Stress management - escitalopram (LEXAPRO) 20 MG tablet; Take 1 tablet (20 mg total) by mouth daily.  Dispense: 90 tablet; Refill: 1  14. Mild intermittent chronic asthma without complication - fluticasone furoate-vilanterol (BREO ELLIPTA) 100-25 MCG/ACT AEPB; Inhale 1 puff into the lungs daily.  Dispense: 1 each; Refill: 11  15. Psoriatic arthritis (HCC) - traMADol (ULTRAM) 50 MG tablet; Take 1 tablet (50 mg total) by mouth 2 (two) times daily.  Dispense: 60 tablet; Refill: 2   Labs pending Health Maintenance reviewed Diet and exercise encouraged  Follow up plan: 3 months   Mary-Margaret MHassell Done FNP

## 2021-08-11 NOTE — Patient Instructions (Signed)

## 2021-08-12 LAB — LIPID PANEL
Chol/HDL Ratio: 2.3 ratio (ref 0.0–5.0)
Cholesterol, Total: 116 mg/dL (ref 100–199)
HDL: 50 mg/dL (ref >39)
LDL Chol Calc (NIH): 37 mg/dL (ref 0–99)
Triglycerides: 176 mg/dL — ABNORMAL HIGH (ref 0–149)
VLDL Cholesterol Cal: 29 mg/dL (ref 5–40)

## 2021-08-12 LAB — CBC WITH DIFFERENTIAL/PLATELET
Basophils Absolute: 0.1 10*3/uL (ref 0.0–0.2)
Basos: 1 %
EOS (ABSOLUTE): 0.2 10*3/uL (ref 0.0–0.4)
Eos: 2 %
Hematocrit: 41.5 % (ref 37.5–51.0)
Hemoglobin: 13.4 g/dL (ref 13.0–17.7)
Immature Grans (Abs): 0.1 10*3/uL (ref 0.0–0.1)
Immature Granulocytes: 1 %
Lymphocytes Absolute: 1.8 10*3/uL (ref 0.7–3.1)
Lymphs: 18 %
MCH: 28.3 pg (ref 26.6–33.0)
MCHC: 32.3 g/dL (ref 31.5–35.7)
MCV: 88 fL (ref 79–97)
Monocytes Absolute: 1 10*3/uL — ABNORMAL HIGH (ref 0.1–0.9)
Monocytes: 10 %
Neutrophils Absolute: 7 10*3/uL (ref 1.4–7.0)
Neutrophils: 68 %
Platelets: 357 10*3/uL (ref 150–450)
RBC: 4.73 x10E6/uL (ref 4.14–5.80)
RDW: 12.8 % (ref 11.6–15.4)
WBC: 10.2 10*3/uL (ref 3.4–10.8)

## 2021-08-12 LAB — CMP14+EGFR
ALT: 18 IU/L (ref 0–44)
AST: 17 IU/L (ref 0–40)
Albumin/Globulin Ratio: 2.3 — ABNORMAL HIGH (ref 1.2–2.2)
Albumin: 4.5 g/dL (ref 3.8–4.8)
Alkaline Phosphatase: 118 IU/L (ref 44–121)
BUN/Creatinine Ratio: 17 (ref 10–24)
BUN: 17 mg/dL (ref 8–27)
Bilirubin Total: 0.3 mg/dL (ref 0.0–1.2)
CO2: 24 mmol/L (ref 20–29)
Calcium: 10.5 mg/dL — ABNORMAL HIGH (ref 8.6–10.2)
Chloride: 99 mmol/L (ref 96–106)
Creatinine, Ser: 0.99 mg/dL (ref 0.76–1.27)
Globulin, Total: 2 g/dL (ref 1.5–4.5)
Glucose: 245 mg/dL — ABNORMAL HIGH (ref 70–99)
Potassium: 4.7 mmol/L (ref 3.5–5.2)
Sodium: 137 mmol/L (ref 134–144)
Total Protein: 6.5 g/dL (ref 6.0–8.5)
eGFR: 78 mL/min/{1.73_m2} (ref >59)

## 2021-08-12 LAB — MICROALBUMIN / CREATININE URINE RATIO
Creatinine, Urine: 52.3 mg/dL
Microalb/Creat Ratio: 25 mg/g creat (ref 0–29)
Microalbumin, Urine: 13 ug/mL

## 2021-08-12 NOTE — Addendum Note (Signed)
Addended by: Rolena Infante on: 08/12/2021 07:51 AM   Modules accepted: Orders

## 2021-08-30 ENCOUNTER — Other Ambulatory Visit: Payer: Self-pay | Admitting: Nurse Practitioner

## 2021-09-05 DIAGNOSIS — L570 Actinic keratosis: Secondary | ICD-10-CM | POA: Diagnosis not present

## 2021-09-19 ENCOUNTER — Telehealth: Payer: Self-pay | Admitting: Nurse Practitioner

## 2021-09-20 NOTE — Telephone Encounter (Signed)
Please let patient know.

## 2021-09-21 ENCOUNTER — Telehealth: Payer: Self-pay | Admitting: Nurse Practitioner

## 2021-09-21 NOTE — Telephone Encounter (Signed)
Pt aware.

## 2021-09-21 NOTE — Telephone Encounter (Signed)
Patient already had message in chart

## 2021-09-21 NOTE — Telephone Encounter (Signed)
Daughter wants to talk to referrals about referral to [facility] Neurology. Please call daughter instead of patient.

## 2021-09-27 ENCOUNTER — Telehealth: Payer: Self-pay | Admitting: Nurse Practitioner

## 2021-10-05 NOTE — Progress Notes (Signed)
Cardiology Office Note    Date:  10/07/2021   ID:  Rodney Hernandez, DOB 1945/06/20, MRN [id number]  PCP:  Chevis Pretty, FNP  Cardiologist: [name], MD    Chief Complaint  Patient presents with   Follow-up    6 month visit    History of Present Illness:    Rodney Hernandez is a 76 y.o. male with past medical history of carotid artery stenosis (s/p L CEA in 01/2020), HTN, HLD, COPD, prior tobacco use and history of CVA (occurring in 01/2020 and received tPA and required thrombectomy) who presents to the office today for 70-monthfollow-up.   He was last examined by myself in 03/2021 and reported baseline dyspnea on exertion but symptoms had improved since being started on Breo and he denied any recent chest pain. He was continued on his current cardiac medications including ASA, Plavix (per Vascular), Atorvastatin 464mdaily, Amlodipine 8m62maily and Benicar 69m71mily.   In talking with the patient and his daughter today, they report he has been having worsening memory issues and word finding over the past 6+ months. He is actually scheduled to see his Neurologist next week. He reports episodic dizziness as well which can occur while sitting or with positional changes. Says this sometimes improves or worsens with turning his head from side to side. He reports occasional palpitations but says these have been occurring for many years and is unaware of any change in frequency or severity of symptoms. Consumes 2-3 cups of coffee daily and no alcohol. Reports occasional tightness in his chest with taking a deep breath but this improved with the use of Breo. No exertional chest pain. No specific orthopnea, PND or pitting edema.   Past Medical History:  Diagnosis Date   Arthritis    psoriatic arthritis and osteo arthritis   Asthma    Cancer (HCC)[place] Prostate   Carotid artery stenosis    a.s/p L CEA in 01/2020   Collapse of lung    Colon polyps    DOE (dyspnea on exertion)  12/02/2018   Eczema    GERD (gastroesophageal reflux disease)    Hypertension    Seasonal allergies    Stroke (HCC)Alpha Vertigo     Past Surgical History:  Procedure Laterality Date   collapsed lung     HERNIA REPAIR     PROSTATE SURGERY      Current Medications: Outpatient Medications Prior to Visit  Medication Sig Dispense Refill   Accu-Chek Softclix Lancets lancets Test BS up to QID Dx E11.9 100 each 12   albuterol (VENTOLIN HFA) 108 (90 Base) MCG/ACT inhaler TAKE 2 PUFFS BY MOUTH EVERY 6 HOURS AS NEEDED FOR WHEEZE OR SHORTNESS OF BREATH 6.7 g 1   amLODipine (NORVASC) 5 MG tablet Take 1 tablet (5 mg total) by mouth daily. 90 tablet 1   aspirin 81 MG chewable tablet Chew 81 mg by mouth daily.     atorvastatin (LIPITOR) 40 MG tablet Take 1 tablet (40 mg total) by mouth daily. 90 tablet 1   blood glucose meter kit and supplies KIT Dispense based on patient and insurance preference. Use up to four times daily as directed. (FOR ICD-9 250.00, 250.01). 1 each 0   cholecalciferol (VITAMIN D3) 25 MCG (1000 UNIT) tablet Take 1 tablet (1,000 Units total) by mouth daily. 90 tablet 3   clopidogrel (PLAVIX) 75 MG tablet Take 1 tablet (75 mg total) by mouth daily. 90 tablet 1  escitalopram (LEXAPRO) 20 MG tablet Take 1 tablet (20 mg total) by mouth daily. 90 tablet 1   fluticasone furoate-vilanterol (BREO ELLIPTA) 100-25 MCG/ACT AEPB Inhale 1 puff into the lungs daily. 1 each 11   glucose blood (ACCU-CHEK GUIDE) test strip Test BS up to QID Dx E11.9 100 each 12   iron polysaccharides (NIFEREX) 150 MG capsule TAKE 1 CAPSULE BY MOUTH EVERY DAY 90 capsule 1   metFORMIN (GLUCOPHAGE) 500 MG tablet Take 1 tablet (500 mg total) by mouth 2 (two) times daily with a meal. 180 tablet 3   olmesartan (BENICAR) 40 MG tablet Take 1 tablet (40 mg total) by mouth daily. 90 tablet 1   pantoprazole (PROTONIX) 40 MG tablet Take 1 tablet (40 mg total) by mouth 2 (two) times daily. 180 tablet 1   sucralfate  (CARAFATE) 1 g tablet TAKE ONE TABLET FOUR TIMES DAILY BEFORE MEALS AND AT BEDTIME 120 tablet 2   traMADol (ULTRAM) 50 MG tablet Take 1 tablet (50 mg total) by mouth 2 (two) times daily. 60 tablet 2   No facility-administered medications prior to visit.     Allergies:   Nsaids, Aspirin, Morphine and related, and Sulfa antibiotics   Social History   Socioeconomic History   Marital status: Widowed    Spouse name: Not on file   Number of children: 2   Years of education: 12   Highest education level: High school graduate  Occupational History   Occupation: Retired    Comment: worked in a Writer for 33 years  Tobacco Use   Smoking status: Former    Packs/day: 2.00    Years: 22.00    Total pack years: 44.00    Types: Cigarettes    Quit date: 01/09/1981    Years since quitting: 40.7   Smokeless tobacco: Never  Vaping Use   Vaping Use: Never used  Substance and Sexual Activity   Alcohol use: No   Drug use: No   Sexual activity: Not Currently  Other Topics Concern   Not on file  Social History Narrative   Widowed, 2 children.   Right handed   12th grade   3 cups daily   His 2 daughters live within half a mile from him - both help him a lot   Social Determinants of Health   Financial Resource Strain: Low Risk  (06/08/2020)   Overall Financial Resource Strain (CARDIA)    Difficulty of Paying Living Expenses: Not hard at all  Food Insecurity: No Food Insecurity (06/08/2020)   Hunger Vital Sign    Worried About Running Out of Food in the Last Year: Never true    Ran Out of Food in the Last Year: Never true  Transportation Needs: No Transportation Needs (06/08/2020)   PRAPARE - Hydrologist (Medical): No    Lack of Transportation (Non-Medical): No  Physical Activity: Insufficiently Active (06/22/2021)   Exercise Vital Sign    Days of Exercise per Week: 7 days    Minutes of Exercise per Session: 20 min  Stress: No Stress Concern Present  (06/22/2021)   [place]    Feeling of Stress : Not at all  Social Connections: Moderately Integrated (06/22/2021)   Social Connection and Isolation Panel [NHANES]    Frequency of Communication with Friends and Family: More than three times a week    Frequency of Social Gatherings with Friends and Family: Twice a week  Attends Religious Services: More than 4 times per year    Active Member of Clubs or Organizations: Yes    Attends Archivist Meetings: More than 4 times per year    Marital Status: Widowed     Family History:  The patient's family history includes Arthritis in his daughter; Cervical cancer in his daughter; Lung cancer in his father; Prostate cancer in his father and paternal uncle; Psoriasis in his daughter; Stomach cancer in his maternal grandmother.   Review of Systems:    Please see the history of present illness.     All other systems reviewed and are otherwise negative except as noted above.   Physical Exam:    VS:  BP 122/68   Pulse 74   Ht 5' 7.5" (1.715 m)   Wt 187 lb 6.4 oz (85 kg)   SpO2 94%   BMI 28.92 kg/m    General: Well developed, well nourished,male appearing in no acute distress. Head: Normocephalic, atraumatic. Neck: No carotid bruits. JVD not elevated.  Lungs: Respirations regular and unlabored, without wheezes or rales.  Heart: Regular rate and rhythm. No S3 or S4.  No murmur, no rubs, or gallops appreciated. Abdomen: Appears non-distended. No obvious abdominal masses. Msk:  Strength and tone appear normal for age. No obvious joint deformities or effusions. Extremities: No clubbing or cyanosis. No pitting edema.  Distal pedal pulses are 2+ bilaterally. Neuro: Alert and oriented X 3. Moves all extremities spontaneously. No focal deficits noted. Psych:  Responds to questions appropriately with a normal affect. Skin: No rashes or lesions noted  Wt Readings from Last  3 Encounters:  10/07/21 187 lb 6.4 oz (85 kg)  08/11/21 184 lb (83.5 kg)  06/22/21 184 lb (83.5 kg)     Studies/Labs Reviewed:   EKG:  EKG is ordered today. The EKG ordered today demonstrates NSR, HR 71 with PAC's and PVC's.   Recent Labs: 08/11/2021: ALT 18; BUN 17; Creatinine, Ser 0.99; Hemoglobin 13.4; Platelets 357; Potassium 4.7; Sodium 137   Lipid Panel    Component Value Date/Time   CHOL 116 08/11/2021 1421   CHOL 186 05/20/2012 1028   TRIG 176 (H) 08/11/2021 1421   TRIG 76 06/11/2014 1241   TRIG 164 (H) 05/20/2012 1028   HDL 50 08/11/2021 1421   HDL 39 (L) 06/11/2014 1241   HDL 42 05/20/2012 1028   CHOLHDL 2.3 08/11/2021 1421   LDLCALC 37 08/11/2021 1421   LDLCALC 89 01/20/2013 1605   LDLCALC 111 (H) 05/20/2012 1028    Additional studies/ records that were reviewed today include:   NST: 11/2018 There was no ST segment deviation noted during stress. The left ventricular ejection fraction is normal (55-65%). Nuclear stress EF: 60%. Defect 1: There is a medium fixed defect of moderate severity present in the basal inferoseptal, basal inferior, mid inferoseptal, mid inferior and apical inferior location. Given fixed defect with normal wall motion in this area, consistent with artifact No T wave inversion was noted during stress. The study is normal. This is a low risk study.  Echocardiogram: 01/2020 Left Ventricle  Normal left ventricular size. Wall thickness is normal. Systolic function is normal. EF: 60-65%. Wall motion is normal. Doppler parameters consistent with mild diastolic dysfunction and low to normal LA pressure.   Right Ventricle  Right ventricle is normal. Systolic function is normal.   Left Atrium  Left atrium is mildly dilated.   Right Atrium  Normal sized right atrium.   IVC/SVC  The inferior  vena cava demonstrates a diameter of <=2.1 cm and collapses >50%; therefore, the right atrial pressure is estimated at 3 mmHg.   Mitral Valve  Mitral  valve structure is normal. There is trace regurgitation.   Tricuspid Valve  Tricuspid valve structure is normal. There is trace regurgitation. The right ventricular systolic pressure is normal (<36 mmHg).   Aortic Valve  The aortic valve is tricuspid. The leaflets are not thickened and exhibit normal excursion. Trace regurgitation. There is no evidence of aortic valve stenosis.   Pulmonic Valve  The pulmonic valve was not well visualized. Trace regurgitation.   Ascending Aorta  The aortic root is normal in size.   Pericardium  There is no pericardial effusion.   Carotid Dopplers: 03/2021 Conclusion:   Right: Internal carotid artery stenosis consistent with < 50%. Antegrade  vertebral flow.      Left: Internal carotid artery stenosis consistent with < 50% s/p  endarterectomy. Antegrade vertebral flow.  Assessment:    1. Bilateral carotid artery stenosis   2. Essential hypertension   3. Hyperlipidemia LDL goal <70   4. PVC's (premature ventricular contractions)      Plan:   In order of problems listed above:  1. Carotid Artery Stenosis - She is s/p L CEA in 01/2020. By review of Care Everywhere, dopplers in 03/2021 showed < 50% stenosis bilaterally. Followed by Vascular Surgery and he has remained on ASA 81 mg daily and Plavix 75 mg daily along with statin therapy.  2. HTN - His blood pressure was at 132/72 on initial check, rechecked and at 122/68. Continue current medical therapy with Amlodipine 5 mg daily and Olmesartan 40 mg daily.  3. HLD - Recent FLP in 08/2021 showed total cholesterol 116, triglycerides 176, HDL 50 and LDL 37. Continue Atorvastatin 40 mg daily.  4. PVC's/Dizziness - He reports occasional dizziness but I doubt the PVC's are the culprit of his symptoms as he reports dizziness ever since his prior CVA. While PVC's were noted on his EKG, he only had one ectopic beat during examination. Orthostatics checked today and negative. Recent echocardiogram  last year showed a preserved EF of 60 to 65% with no significant valve abnormalities. CBC and BMET last month were reassuring. Will check a Mg and TSH. I encouraged him to keep scheduled follow-up with Neurology next week. If they are unable to identify a culprit for his dizziness, we can place a 7-day Zio patch to assess his PVC burden and to rule out any other significant arrhythmias as the culprit of his dizziness.    Medication Adjustments/Labs and Tests Ordered: Current medicines are reviewed at length with the patient today.  Concerns regarding medicines are outlined above.  Medication changes, Labs and Tests ordered today are listed in the Patient Instructions below. Patient Instructions  Medication Instructions:  Your physician recommends that you continue on your current medications as directed. Please refer to the Current Medication list given to you today.  *If you need a refill on your cardiac medications before your next appointment, please call your pharmacy*   Lab Work: Your physician recommends that you return for lab work. Magnesium, TSH   If you have labs (blood work) drawn today and your tests are completely normal, you will receive your results only by: [place] (if you have MyChart) OR A paper copy in the mail If you have any lab test that is abnormal or we need to change your treatment, we will call you to review the results.  Testing/Procedures: NONE    Follow-Up: At [facility], you and your health needs are our priority.  As part of our continuing mission to provide you with exceptional heart care, we have created designated Provider Care Teams.  These Care Teams include your primary Cardiologist (physician) and Advanced Practice Providers (APPs -  Physician Assistants and Nurse Practitioners) who all work together to provide you with the care you need, when you need it.  We recommend signing up for the patient portal called "MyChart".  Sign up  information is provided on this After Visit Summary.  MyChart is used to connect with patients for Virtual Visits (Telemedicine).  Patients are able to view lab/test results, encounter notes, upcoming appointments, etc.  Non-urgent messages can be sent to your provider as well.   To learn more about what you can do with MyChart, go to [email or web address].    Your next appointment:   6 month(s)  The format for your next appointment:   In Person  Provider:   You may see [name], MD or one of the following Advanced Practice Providers on your designated Care Team:   Bernerd Pho, PA-C  Ermalinda Barrios, [place]     Other Instructions Thank you for choosing [facility]!    Important Information About Sugar         Signed, Erma Heritage, PA-C  10/07/2021 2:00 PM    [facility] [address] [place], [place] [address] Phone: [phone] Fax: [phone]

## 2021-10-07 ENCOUNTER — Ambulatory Visit: Payer: Medicare PPO | Attending: Student | Admitting: Student

## 2021-10-07 ENCOUNTER — Encounter: Payer: Self-pay | Admitting: Student

## 2021-10-07 VITALS — BP 122/68 | HR 74 | Ht 67.5 in | Wt 187.4 lb

## 2021-10-07 DIAGNOSIS — E785 Hyperlipidemia, unspecified: Secondary | ICD-10-CM | POA: Diagnosis not present

## 2021-10-07 DIAGNOSIS — I6523 Occlusion and stenosis of bilateral carotid arteries: Secondary | ICD-10-CM | POA: Diagnosis not present

## 2021-10-07 DIAGNOSIS — I493 Ventricular premature depolarization: Secondary | ICD-10-CM | POA: Diagnosis not present

## 2021-10-07 DIAGNOSIS — I10 Essential (primary) hypertension: Secondary | ICD-10-CM

## 2021-10-07 NOTE — Patient Instructions (Signed)
Medication Instructions:  Your physician recommends that you continue on your current medications as directed. Please refer to the Current Medication list given to you today.  *If you need a refill on your cardiac medications before your next appointment, please call your pharmacy*   Lab Work: Your physician recommends that you return for lab work. Magnesium, TSH   If you have labs (blood work) drawn today and your tests are completely normal, you will receive your results only by: [place] (if you have MyChart) OR A paper copy in the mail If you have any lab test that is abnormal or we need to change your treatment, we will call you to review the results.   Testing/Procedures: NONE    Follow-Up: At [facility], you and your health needs are our priority.  As part of our continuing mission to provide you with exceptional heart care, we have created designated Provider Care Teams.  These Care Teams include your primary Cardiologist (physician) and Advanced Practice Providers (APPs -  Physician Assistants and Nurse Practitioners) who all work together to provide you with the care you need, when you need it.  We recommend signing up for the patient portal called "MyChart".  Sign up information is provided on this After Visit Summary.  MyChart is used to connect with patients for Virtual Visits (Telemedicine).  Patients are able to view lab/test results, encounter notes, upcoming appointments, etc.  Non-urgent messages can be sent to your provider as well.   To learn more about what you can do with MyChart, go to [email or web address].    Your next appointment:   6 month(s)  The format for your next appointment:   In Person  Provider:   You may see [name], MD or one of the following Advanced Practice Providers on your designated Care Team:   Bernerd Pho, PA-C  Ermalinda Barrios, [place]     Other Instructions Thank you for choosing [facility]!    Important Information About Sugar

## 2021-10-10 ENCOUNTER — Other Ambulatory Visit: Payer: Medicare PPO

## 2021-10-10 DIAGNOSIS — R002 Palpitations: Secondary | ICD-10-CM | POA: Diagnosis not present

## 2021-10-10 DIAGNOSIS — I493 Ventricular premature depolarization: Secondary | ICD-10-CM | POA: Diagnosis not present

## 2021-10-10 DIAGNOSIS — Z428 Encounter for other plastic and reconstructive surgery following medical procedure or healed injury: Secondary | ICD-10-CM | POA: Diagnosis not present

## 2021-10-11 LAB — TSH: TSH: 0.871 u[IU]/mL (ref 0.450–4.500)

## 2021-10-11 LAB — MAGNESIUM: Magnesium: 2.5 mg/dL — ABNORMAL HIGH (ref 1.6–2.3)

## 2021-10-14 DIAGNOSIS — R42 Dizziness and giddiness: Secondary | ICD-10-CM | POA: Diagnosis not present

## 2021-10-14 DIAGNOSIS — Z6826 Body mass index (BMI) 26.0-26.9, adult: Secondary | ICD-10-CM | POA: Diagnosis not present

## 2021-10-14 DIAGNOSIS — G4710 Hypersomnia, unspecified: Secondary | ICD-10-CM | POA: Diagnosis not present

## 2021-10-14 DIAGNOSIS — R0683 Snoring: Secondary | ICD-10-CM | POA: Diagnosis not present

## 2021-10-14 DIAGNOSIS — R29818 Other symptoms and signs involving the nervous system: Secondary | ICD-10-CM | POA: Diagnosis not present

## 2021-10-14 DIAGNOSIS — Z8673 Personal history of transient ischemic attack (TIA), and cerebral infarction without residual deficits: Secondary | ICD-10-CM | POA: Diagnosis not present

## 2021-10-14 DIAGNOSIS — E1140 Type 2 diabetes mellitus with diabetic neuropathy, unspecified: Secondary | ICD-10-CM | POA: Diagnosis not present

## 2021-10-31 DIAGNOSIS — R42 Dizziness and giddiness: Secondary | ICD-10-CM | POA: Diagnosis not present

## 2021-10-31 DIAGNOSIS — I639 Cerebral infarction, unspecified: Secondary | ICD-10-CM | POA: Diagnosis not present

## 2021-10-31 DIAGNOSIS — Z8673 Personal history of transient ischemic attack (TIA), and cerebral infarction without residual deficits: Secondary | ICD-10-CM | POA: Diagnosis not present

## 2021-11-02 ENCOUNTER — Ambulatory Visit (INDEPENDENT_AMBULATORY_CARE_PROVIDER_SITE_OTHER): Payer: Medicare PPO

## 2021-11-02 DIAGNOSIS — Z23 Encounter for immunization: Secondary | ICD-10-CM | POA: Diagnosis not present

## 2021-11-08 DIAGNOSIS — R42 Dizziness and giddiness: Secondary | ICD-10-CM | POA: Diagnosis not present

## 2021-11-08 DIAGNOSIS — I639 Cerebral infarction, unspecified: Secondary | ICD-10-CM | POA: Diagnosis not present

## 2021-11-11 ENCOUNTER — Encounter: Payer: Self-pay | Admitting: Nurse Practitioner

## 2021-11-11 ENCOUNTER — Ambulatory Visit (INDEPENDENT_AMBULATORY_CARE_PROVIDER_SITE_OTHER): Payer: Medicare PPO | Admitting: Nurse Practitioner

## 2021-11-11 VITALS — BP 111/66 | HR 67 | Temp 98.4°F | Resp 20 | Ht 67.0 in | Wt 183.0 lb

## 2021-11-11 DIAGNOSIS — E785 Hyperlipidemia, unspecified: Secondary | ICD-10-CM | POA: Diagnosis not present

## 2021-11-11 DIAGNOSIS — I6930 Unspecified sequelae of cerebral infarction: Secondary | ICD-10-CM | POA: Diagnosis not present

## 2021-11-11 DIAGNOSIS — I63239 Cerebral infarction due to unspecified occlusion or stenosis of unspecified carotid arteries: Secondary | ICD-10-CM

## 2021-11-11 DIAGNOSIS — R609 Edema, unspecified: Secondary | ICD-10-CM

## 2021-11-11 DIAGNOSIS — E291 Testicular hypofunction: Secondary | ICD-10-CM

## 2021-11-11 DIAGNOSIS — D509 Iron deficiency anemia, unspecified: Secondary | ICD-10-CM

## 2021-11-11 DIAGNOSIS — I10 Essential (primary) hypertension: Secondary | ICD-10-CM

## 2021-11-11 DIAGNOSIS — R972 Elevated prostate specific antigen [PSA]: Secondary | ICD-10-CM | POA: Diagnosis not present

## 2021-11-11 DIAGNOSIS — F321 Major depressive disorder, single episode, moderate: Secondary | ICD-10-CM

## 2021-11-11 DIAGNOSIS — N401 Enlarged prostate with lower urinary tract symptoms: Secondary | ICD-10-CM | POA: Diagnosis not present

## 2021-11-11 DIAGNOSIS — L4050 Arthropathic psoriasis, unspecified: Secondary | ICD-10-CM

## 2021-11-11 DIAGNOSIS — E119 Type 2 diabetes mellitus without complications: Secondary | ICD-10-CM | POA: Diagnosis not present

## 2021-11-11 DIAGNOSIS — I700 Atherosclerosis of aorta: Secondary | ICD-10-CM | POA: Diagnosis not present

## 2021-11-11 DIAGNOSIS — J4520 Mild intermittent asthma, uncomplicated: Secondary | ICD-10-CM

## 2021-11-11 DIAGNOSIS — K219 Gastro-esophageal reflux disease without esophagitis: Secondary | ICD-10-CM

## 2021-11-11 LAB — BAYER DCA HB A1C WAIVED: HB A1C (BAYER DCA - WAIVED): 6.9 % — ABNORMAL HIGH (ref 4.8–5.6)

## 2021-11-11 MED ORDER — TRAMADOL HCL 50 MG PO TABS: 50.0000 mg | ORAL_TABLET | Freq: Two times a day (BID) | ORAL | 2 refills | Status: DC

## 2021-11-11 NOTE — Patient Instructions (Signed)

## 2021-11-11 NOTE — Progress Notes (Signed)
Subjective:    Patient ID: Rodney Hernandez, male    DOB: [birth year], 76 y.o.   MRN: [id number]   Chief Complaint: medical management of chronic issues     HPI:  Rodney Hernandez is a 76 y.o. who identifies as a male who was assigned male at birth.   Social history: Lives with: by himself- his daughters check on him daily Work history: retired   Scientist, forensic in today for follow up of the following chronic medical issues:  1. Essential hypertension No c/o chest pain, sob or headache. Does not check blood pressure at home very often BP Readings from Last 3 Encounters:  10/07/21 122/68  08/11/21 129/71  05/10/21 139/62     2. Hyperlipidemia with target LDL less than 100 Doe snot really watch diet and is not very active. Lab Results  Component Value Date   CHOL 116 08/11/2021   HDL 50 08/11/2021   LDLCALC 37 08/11/2021   TRIG 176 (H) 08/11/2021   CHOLHDL 2.3 08/11/2021     3. Carotid stenosis, symptomatic, with infarction [facility]) Has had enterectomy- denies any dizziness or syncopal episodes. Saw cardiology on 09/2921. Had doppler study on 03/28/21 which showed less than 30% stenosis  4. Aortic atherosclerosis (HCC) Last chest xray was good  5. Late effect of cerebrovascular accident (CVA) Has no permanent side effects. Is on plavix and is doing well. No bleeding issues  6. Gastroesophageal reflux disease without esophagitis Takes protonix daily. Doing well  7. Hypogonadism in male Is currently not on testosterone   8. Benign prostatic hyperplasia with lower urinary tract symptoms, symptom details unspecified Labs pending. No voiding issues  9. Elevated prostate specific antigen (PSA) Lab Results  Component Value Date   PSA1 2.2 05/27/2021   PSA1 4.9 (H) 02/10/2021   PSA1 21.1 (H) 10/01/2020      10. Peripheral edema Has occasional lower ext edema. Usually resolves at night  11. Anemia Is on niferex Lab Results  Component Value Date   HGB 13.4 08/11/2021   12.  Depression Is on lexapro and is doing well.    11/11/2021    2:22 PM 08/11/2021    2:29 PM 05/10/2021    2:20 PM 02/10/2021    2:24 PM  GAD 7 : Generalized Anxiety Score  Nervous, Anxious, on Edge 1 1 0 1  Control/stop worrying 0 0 0 0  Worry too much - different things 0 0 0 0  Trouble relaxing 1 1 0 1  Restless 2 1 0 1  Easily annoyed or irritable 0 1 0 2  Afraid - awful might happen 0 0 0 0  Total GAD 7 Score 4 4 0 5  Anxiety Difficulty Not difficult at all Somewhat difficult Not difficult at all Not difficult at all      13. diabetes Does not check blood sugars every day. Nor does he watch his diet. We started him on metformin at last visit Lab Results  Component Value Date   HGBA1C 7.8 (H) 08/11/2021   14. Mld intermittent chronic asthma without complications Is on BREo which seems to help with his breathing  15. Psoriatic arthritis Pain assessment: Cause of pain- psoriatic arthritis Pain location- varyingjoints Pain on scale of 1-10- 6/10 currently Frequency- daily What increases pain-nothing What makes pain Better-pain meds help Effects on ADL - none Any change in general medical condition-none  Current opioids rx- ultram bid prn # meds rx- 60 Effectiveness of current meds-helps Adverse reactions from pain meds-none Morphine  equivalent- 10MME  Pill count performed-No Last drug screen - 05/10/21 ( high risk q31m moderate risk q638mlow risk yearly ) Urine drug screen today- No [place]- yes  If yes were their any concerning findings? - none   Overdose risk: 1   Pain contract signed on:05/17/21  New  complaints: none  Allergies  Allergen Reactions   Nsaids Shortness Of Breath   Aspirin Other (See Comments)    Affects patient breathing   Morphine And Related Hives and Itching   Sulfa Antibiotics    Outpatient Encounter Medications as of 11/11/2021  Medication Sig   Accu-Chek Softclix Lancets lancets Test BS up to QID Dx E11.9   albuterol  (VENTOLIN HFA) 108 (90 Base) MCG/ACT inhaler TAKE 2 PUFFS BY MOUTH EVERY 6 HOURS AS NEEDED FOR WHEEZE OR SHORTNESS OF BREATH   amLODipine (NORVASC) 5 MG tablet Take 1 tablet (5 mg total) by mouth daily.   aspirin 81 MG chewable tablet Chew 81 mg by mouth daily.   atorvastatin (LIPITOR) 40 MG tablet Take 1 tablet (40 mg total) by mouth daily.   blood glucose meter kit and supplies KIT Dispense based on patient and insurance preference. Use up to four times daily as directed. (FOR ICD-9 250.00, 250.01).   cholecalciferol (VITAMIN D3) 25 MCG (1000 UNIT) tablet Take 1 tablet (1,000 Units total) by mouth daily.   clopidogrel (PLAVIX) 75 MG tablet Take 1 tablet (75 mg total) by mouth daily.   escitalopram (LEXAPRO) 20 MG tablet Take 1 tablet (20 mg total) by mouth daily.   fluticasone furoate-vilanterol (BREO ELLIPTA) 100-25 MCG/ACT AEPB Inhale 1 puff into the lungs daily.   glucose blood (ACCU-CHEK GUIDE) test strip Test BS up to QID Dx E11.9   iron polysaccharides (NIFEREX) 150 MG capsule TAKE 1 CAPSULE BY MOUTH EVERY DAY   metFORMIN (GLUCOPHAGE) 500 MG tablet Take 1 tablet (500 mg total) by mouth 2 (two) times daily with a meal.   olmesartan (BENICAR) 40 MG tablet Take 1 tablet (40 mg total) by mouth daily.   pantoprazole (PROTONIX) 40 MG tablet Take 1 tablet (40 mg total) by mouth 2 (two) times daily.   sucralfate (CARAFATE) 1 g tablet TAKE ONE TABLET FOUR TIMES DAILY BEFORE MEALS AND AT BEDTIME   traMADol (ULTRAM) 50 MG tablet Take 1 tablet (50 mg total) by mouth 2 (two) times daily.   No facility-administered encounter medications on file as of 11/11/2021.    Past Surgical History:  Procedure Laterality Date   collapsed lung     HERNIA REPAIR     PROSTATE SURGERY      Family History  Problem Relation Age of Onset   Lung cancer Father    Prostate cancer Father    Stomach cancer Maternal Grandmother    Prostate cancer Paternal Uncle    Cervical cancer Daughter    Psoriasis Daughter     Arthritis Daughter        psoriatic arthritis      Controlled substance contract: 05/17/21     Review of Systems  Constitutional:  Negative for diaphoresis.  Eyes:  Negative for pain.  Respiratory:  Negative for shortness of breath.   Cardiovascular:  Negative for chest pain, palpitations and leg swelling.  Gastrointestinal:  Negative for abdominal pain.  Endocrine: Negative for polydipsia.  Skin:  Negative for rash.  Neurological:  Negative for dizziness, weakness and headaches.  Hematological:  Does not bruise/bleed easily.  All other systems reviewed and are negative.  Objective:   Physical Exam Vitals and nursing note reviewed.  Constitutional:      Appearance: Normal appearance. He is well-developed.  HENT:     Head: Normocephalic.     Nose: Nose normal.     Mouth/Throat:     Mouth: Mucous membranes are moist.     Pharynx: Oropharynx is clear.  Eyes:     Pupils: Pupils are equal, round, and reactive to light.  Neck:     Thyroid: No thyroid mass or thyromegaly.     Vascular: No carotid bruit or JVD.     Trachea: Phonation normal.  Cardiovascular:     Rate and Rhythm: Normal rate and regular rhythm.  Pulmonary:     Effort: Pulmonary effort is normal. No respiratory distress.     Breath sounds: Normal breath sounds.  Abdominal:     General: Bowel sounds are normal.     Palpations: Abdomen is soft.     Tenderness: There is no abdominal tenderness.     Hernia: A hernia (ventral hernia) is present.  Musculoskeletal:        General: Normal range of motion.     Cervical back: Normal range of motion and neck supple.  Lymphadenopathy:     Cervical: No cervical adenopathy.  Skin:    General: Skin is warm and dry.  Neurological:     Mental Status: He is alert and oriented to person, place, and time.  Psychiatric:        Behavior: Behavior normal.        Thought Content: Thought content normal.        Judgment: Judgment normal.    BP 111/66   Pulse 67    Temp 98.4 F (36.9 C) (Temporal)   Resp 20   Ht _0  (1.702 m)   Wt 183 lb (83 kg)   SpO2 91%   BMI 28.66 kg/m   HGBA1c 6.9%       Assessment & Plan:   Eldar Robitaille comes in today with chief complaint of Medical Management of Chronic Issues   Diagnosis and orders addressed:  1. Essential hypertension Low sodium diet - CBC with Differential/Platelet - CMP14+EGFR  2. Hyperlipidemia with target LDL less than 100 Low fat diet - Lipid panel  3. Carotid stenosis, symptomatic, with infarction ([place]) 4. Aortic atherosclerosis ([place]) 5. Late effect of cerebrovascular accident (CVA) Keep follow up with cardiology  6. Gastroesophageal reflux disease without esophagitis Avoid spicy foods Do not eat 2 hours prior to bedtime   7. Hypogonadism in male Labs pending  8. Benign prostatic hyperplasia with lower urinary tract symptoms, symptom details unspecified Labs pending - PSA, total and free  9. Elevated prostate specific antigen (PSA) Report any issues voiding  10. Peripheral edema Elevate legs when sitting  11. Psoriatic arthritis (HCC)  - traMADol (ULTRAM) 50 MG tablet; Take 1 tablet (50 mg total) by mouth 2 (two) times daily.  Dispense: 60 tablet; Refill: 2  12. Diabetes mellitus without complication ([place]) Continue to watch  carbs in diet - Bayer DCA Hb A1c Waived  13. Mild intermittent chronic asthma without complication Continue BREO as needed  14. Depression, major, single episode, moderate ([place]) Stress management  15. Iron deficiency anemia, unspecified iron deficiency anemia type Labs pending   Labs pending Health Maintenance reviewed Diet and exercise encouraged  Follow up plan: 3 months   Mary-Margaret Hassell Done, FNP   months

## 2021-11-12 LAB — LIPID PANEL
Chol/HDL Ratio: 2.3 ratio (ref 0.0–5.0)
Cholesterol, Total: 98 mg/dL — ABNORMAL LOW (ref 100–199)
HDL: 43 mg/dL (ref >39)
LDL Chol Calc (NIH): 37 mg/dL (ref 0–99)
Triglycerides: 94 mg/dL (ref 0–149)
VLDL Cholesterol Cal: 18 mg/dL (ref 5–40)

## 2021-11-12 LAB — CMP14+EGFR
ALT: 16 IU/L (ref 0–44)
AST: 21 IU/L (ref 0–40)
Albumin/Globulin Ratio: 2.1 (ref 1.2–2.2)
Albumin: 4.2 g/dL (ref 3.8–4.8)
Alkaline Phosphatase: 96 IU/L (ref 44–121)
BUN/Creatinine Ratio: 15 (ref 10–24)
BUN: 16 mg/dL (ref 8–27)
Bilirubin Total: 0.3 mg/dL (ref 0.0–1.2)
CO2: 25 mmol/L (ref 20–29)
Calcium: 10.2 mg/dL (ref 8.6–10.2)
Chloride: 95 mmol/L — ABNORMAL LOW (ref 96–106)
Creatinine, Ser: 1.05 mg/dL (ref 0.76–1.27)
Globulin, Total: 2 g/dL (ref 1.5–4.5)
Glucose: 147 mg/dL — ABNORMAL HIGH (ref 70–99)
Potassium: 4.2 mmol/L (ref 3.5–5.2)
Sodium: 131 mmol/L — ABNORMAL LOW (ref 134–144)
Total Protein: 6.2 g/dL (ref 6.0–8.5)
eGFR: 73 mL/min/{1.73_m2} (ref >59)

## 2021-11-12 LAB — CBC WITH DIFFERENTIAL/PLATELET
Basophils Absolute: 0.1 10*3/uL (ref 0.0–0.2)
Basos: 1 %
EOS (ABSOLUTE): 0.1 10*3/uL (ref 0.0–0.4)
Eos: 1 %
Hematocrit: 40.2 % (ref 37.5–51.0)
Hemoglobin: 13 g/dL (ref 13.0–17.7)
Immature Grans (Abs): 0.1 10*3/uL (ref 0.0–0.1)
Immature Granulocytes: 1 %
Lymphocytes Absolute: 0.9 10*3/uL (ref 0.7–3.1)
Lymphs: 10 %
MCH: 28.3 pg (ref 26.6–33.0)
MCHC: 32.3 g/dL (ref 31.5–35.7)
MCV: 88 fL (ref 79–97)
Monocytes Absolute: 1.4 10*3/uL — ABNORMAL HIGH (ref 0.1–0.9)
Monocytes: 15 %
Neutrophils Absolute: 7 10*3/uL (ref 1.4–7.0)
Neutrophils: 72 %
Platelets: 251 10*3/uL (ref 150–450)
RBC: 4.59 x10E6/uL (ref 4.14–5.80)
RDW: 12.9 % (ref 11.6–15.4)
WBC: 9.5 10*3/uL (ref 3.4–10.8)

## 2021-11-15 ENCOUNTER — Encounter: Payer: Self-pay | Admitting: Student

## 2021-11-15 DIAGNOSIS — R002 Palpitations: Secondary | ICD-10-CM | POA: Insufficient documentation

## 2021-11-15 DIAGNOSIS — R42 Dizziness and giddiness: Secondary | ICD-10-CM | POA: Insufficient documentation

## 2021-11-15 LAB — SPECIMEN STATUS REPORT

## 2021-11-15 LAB — PSA TOTAL (REFLEX TO FREE): Prostate Specific Ag, Serum: 0.8 ng/mL (ref 0.0–4.0)

## 2021-12-02 ENCOUNTER — Other Ambulatory Visit: Payer: Self-pay | Admitting: Nurse Practitioner

## 2021-12-13 DIAGNOSIS — R42 Dizziness and giddiness: Secondary | ICD-10-CM | POA: Diagnosis not present

## 2021-12-15 ENCOUNTER — Telehealth: Payer: Self-pay | Admitting: Student

## 2021-12-15 MED ORDER — METOPROLOL SUCCINATE ER 25 MG PO TB24: 12.5000 mg | ORAL_TABLET | Freq: Every day | ORAL | 2 refills | Status: DC

## 2021-12-15 NOTE — Telephone Encounter (Signed)
I spoke with daughter and relayed B.Strader's message. I also e-scribed Toprol CL 12.5 mg qd to [place]. Daughter states they will monitor his BP/HR.

## 2021-12-15 NOTE — Telephone Encounter (Signed)
Patient's daughter states Dr. Erling Cruz (neurologist) orders a 30 day monitor and patient completed the monitoring cycle on 11/28. She states they have already gone over the results with her/the patient and sent the results to [place], [place], yesterday. She would like to confirm that this has been received and is hopeful to have the results reviewed soon.

## 2021-12-15 NOTE — Telephone Encounter (Addendum)
     I had not received anything but appears the report is in Care Everywhere. In summary, this showed predominately normal sinus rhythm with an average HR of 62 bpm. Had triplets of PVC's and self-terminating episodes of SVT with the longest being 22 beats. His overall PVC burden was 4.6% but thankfully no episodes of atrial fibrillation or flutter.   His PVC burden is not in a dangerous range and it does not appear his dizziness correlated with those. Can try low-dose Toprol-XL 12.'5mg'$  daily to see if this helps at all with symptoms. Would follow HR and BP with this. Would adjust slowly in the future if needed given his average HR was in the 60's.   Signed, Erma Heritage, PA-C 12/15/2021, 10:55 AM

## 2021-12-15 NOTE — Telephone Encounter (Signed)
I will [place] provider

## 2021-12-20 DIAGNOSIS — C61 Malignant neoplasm of prostate: Secondary | ICD-10-CM | POA: Diagnosis not present

## 2022-02-07 ENCOUNTER — Other Ambulatory Visit: Payer: Self-pay | Admitting: Nurse Practitioner

## 2022-02-13 ENCOUNTER — Ambulatory Visit (INDEPENDENT_AMBULATORY_CARE_PROVIDER_SITE_OTHER): Payer: Medicare PPO | Admitting: Nurse Practitioner

## 2022-02-13 ENCOUNTER — Encounter: Payer: Self-pay | Admitting: Nurse Practitioner

## 2022-02-13 VITALS — BP 125/63 | HR 56 | Temp 97.3°F | Resp 20 | Ht 67.0 in | Wt 186.0 lb

## 2022-02-13 DIAGNOSIS — L4050 Arthropathic psoriasis, unspecified: Secondary | ICD-10-CM

## 2022-02-13 DIAGNOSIS — E785 Hyperlipidemia, unspecified: Secondary | ICD-10-CM | POA: Diagnosis not present

## 2022-02-13 DIAGNOSIS — K219 Gastro-esophageal reflux disease without esophagitis: Secondary | ICD-10-CM | POA: Diagnosis not present

## 2022-02-13 DIAGNOSIS — I63239 Cerebral infarction due to unspecified occlusion or stenosis of unspecified carotid arteries: Secondary | ICD-10-CM

## 2022-02-13 DIAGNOSIS — E119 Type 2 diabetes mellitus without complications: Secondary | ICD-10-CM

## 2022-02-13 DIAGNOSIS — I700 Atherosclerosis of aorta: Secondary | ICD-10-CM | POA: Diagnosis not present

## 2022-02-13 DIAGNOSIS — I10 Essential (primary) hypertension: Secondary | ICD-10-CM | POA: Diagnosis not present

## 2022-02-13 DIAGNOSIS — D509 Iron deficiency anemia, unspecified: Secondary | ICD-10-CM | POA: Diagnosis not present

## 2022-02-13 DIAGNOSIS — E291 Testicular hypofunction: Secondary | ICD-10-CM

## 2022-02-13 DIAGNOSIS — N401 Enlarged prostate with lower urinary tract symptoms: Secondary | ICD-10-CM

## 2022-02-13 DIAGNOSIS — J4520 Mild intermittent asthma, uncomplicated: Secondary | ICD-10-CM | POA: Diagnosis not present

## 2022-02-13 DIAGNOSIS — R0609 Other forms of dyspnea: Secondary | ICD-10-CM

## 2022-02-13 DIAGNOSIS — R609 Edema, unspecified: Secondary | ICD-10-CM

## 2022-02-13 DIAGNOSIS — I6930 Unspecified sequelae of cerebral infarction: Secondary | ICD-10-CM

## 2022-02-13 DIAGNOSIS — F321 Major depressive disorder, single episode, moderate: Secondary | ICD-10-CM

## 2022-02-13 LAB — BAYER DCA HB A1C WAIVED: HB A1C (BAYER DCA - WAIVED): 7 % — ABNORMAL HIGH (ref 4.8–5.6)

## 2022-02-13 MED ORDER — AMLODIPINE BESYLATE 5 MG PO TABS: 5.0000 mg | ORAL_TABLET | Freq: Every day | ORAL | 1 refills | Status: DC

## 2022-02-13 MED ORDER — ESCITALOPRAM OXALATE 20 MG PO TABS: 20.0000 mg | ORAL_TABLET | Freq: Every day | ORAL | 1 refills | Status: DC

## 2022-02-13 MED ORDER — ALBUTEROL SULFATE HFA 108 (90 BASE) MCG/ACT IN AERS: INHALATION_SPRAY | RESPIRATORY_TRACT | 1 refills | Status: DC

## 2022-02-13 MED ORDER — CLOPIDOGREL BISULFATE 75 MG PO TABS: 75.0000 mg | ORAL_TABLET | Freq: Every day | ORAL | 1 refills | Status: DC

## 2022-02-13 MED ORDER — FLUTICASONE FUROATE-VILANTEROL 100-25 MCG/ACT IN AEPB: 1.0000 | INHALATION_SPRAY | Freq: Every day | RESPIRATORY_TRACT | 11 refills | Status: DC

## 2022-02-13 MED ORDER — PANTOPRAZOLE SODIUM 40 MG PO TBEC: 40.0000 mg | DELAYED_RELEASE_TABLET | Freq: Two times a day (BID) | ORAL | 1 refills | Status: DC

## 2022-02-13 MED ORDER — ATORVASTATIN CALCIUM 40 MG PO TABS: 40.0000 mg | ORAL_TABLET | Freq: Every day | ORAL | 1 refills | Status: DC

## 2022-02-13 MED ORDER — POLYSACCHARIDE IRON COMPLEX 150 MG PO CAPS: ORAL_CAPSULE | ORAL | 1 refills | Status: DC

## 2022-02-13 MED ORDER — METFORMIN HCL 500 MG PO TABS: 500.0000 mg | ORAL_TABLET | Freq: Two times a day (BID) | ORAL | 3 refills | Status: DC

## 2022-02-13 MED ORDER — TRAMADOL HCL 50 MG PO TABS: 50.0000 mg | ORAL_TABLET | Freq: Two times a day (BID) | ORAL | 2 refills | Status: DC

## 2022-02-13 MED ORDER — OLMESARTAN MEDOXOMIL 40 MG PO TABS: 40.0000 mg | ORAL_TABLET | Freq: Every day | ORAL | 1 refills | Status: DC

## 2022-02-13 NOTE — Patient Instructions (Signed)
Peripheral Edema  Peripheral edema is swelling that is caused by a buildup of fluid. Peripheral edema most often affects the lower legs, ankles, and feet. It can also develop in the arms, hands, and face. The area of the body that has peripheral edema will look swollen. It may also feel heavy or warm. Your clothes may start to feel tight. Pressing on the area may make a temporary dent in your skin (pitting edema). You may not be able to move your swollen arm or leg as much as usual. There are many causes of peripheral edema. It can happen because of a complication of other conditions such as heart failure, kidney disease, or a problem with your circulation. It also can be a side effect of certain medicines or happen because of an infection. It often happens to women during pregnancy. Sometimes, the cause is not known. Follow these instructions at home: Managing pain, stiffness, and swelling  Raise (elevate) your legs while you are sitting or lying down. Move around often to prevent stiffness and to reduce swelling. Do not sit or stand for long periods of time. Do not wear tight clothing. Do not wear garters on your upper legs. Exercise your legs to get your circulation going. This helps to move the fluid back into your blood vessels, and it may help the swelling go down. Wear compression stockings as told by your health care provider. These stockings help to prevent blood clots and reduce swelling in your legs. It is important that these are the correct size. These stockings should be prescribed by your doctor to prevent possible injuries. If elastic bandages or wraps are recommended, use them as told by your health care provider. Medicines Take over-the-counter and prescription medicines only as told by your health care provider. Your health care provider may prescribe medicine to help your body get rid of excess water (diuretic). Take this medicine if you are told to take it. General  instructions Eat a low-salt (low-sodium) diet as told by your health care provider. Sometimes, eating less salt may reduce swelling. Pay attention to any changes in your symptoms. Moisturize your skin daily to help prevent skin from cracking and draining. Keep all follow-up visits. This is important. Contact a health care provider if: You have a fever. You have swelling in only one leg. You have increased swelling, redness, or pain in one or both of your legs. You have drainage or sores at the area where you have edema. Get help right away if: You have edema that starts suddenly or is getting worse, especially if you are pregnant or have a medical condition. You develop shortness of breath, especially when you are lying down. You have pain in your chest or abdomen. You feel weak. You feel like you will faint. These symptoms may be an emergency. Get help right away. Call 911. Do not wait to see if the symptoms will go away. Do not drive yourself to the hospital. Summary Peripheral edema is swelling that is caused by a buildup of fluid. Peripheral edema most often affects the lower legs, ankles, and feet. Move around often to prevent stiffness and to reduce swelling. Do not sit or stand for long periods of time. Pay attention to any changes in your symptoms. Contact a health care provider if you have edema that starts suddenly or is getting worse, especially if you are pregnant or have a medical condition. Get help right away if you develop shortness of breath, especially when lying down.   This information is not intended to replace advice given to you by your health care provider. Make sure you discuss any questions you have with your health care provider. Document Revised: 08/30/2020 Document Reviewed: 08/30/2020 Elsevier Patient Education  2023 [employer]  

## 2022-02-13 NOTE — Progress Notes (Signed)
Subjective:    Patient ID: Rodney Hernandez, male    DOB: [birth year], 77 y.o.   MRN: [id number]   Chief Complaint: medical management of chronic issues     HPI:  Rodney Hernandez is a 77 y.o. who identifies as a male who was assigned male at birth.   Social history: Lives with: by himself- his daughters check on him daiy Work history: retired   Scientist, forensic in today for follow up of the following chronic medical issues:  1. Essential hypertension No c/o chest pain, sob or headache. Does not check blood pressure at home very often. BP Readings from Last 3 Encounters:  11/11/21 111/66  10/07/21 122/68  08/11/21 129/71     2. Aortic atherosclerosis (HCC) No c/o chest pain or sob. He saw cardiology in12/2023. They placed an event monitor on him. He had 18 episodes of VT lasting no more then 3 beats. They started him on toprol CL 12.5 daily.   3. Carotid stenosis, symptomatic, with infarction ([place]) No carotid doppler study reports are on chart.  4. Mild intermittent chronic asthma without complication Has uses BREO daily. Has not need albuterol as of late.  5. Gastroesophageal reflux disease without esophagitis Is on protonix daily and is doing well.  6. Diabetes mellitus without complication ([place]) He does not check blood sugars at home. No symptoms of low blood sugars Lab Results  Component Value Date   HGBA1C 6.9 (H) 11/11/2021     7. Hypogonadism in male Is currently on no testosterone   8. Benign prostatic hyperplasia with lower urinary tract symptoms, symptom details unspecified No viding issues Lab Results  Component Value Date   PSA1 0.8 11/11/2021   PSA1 2.2 05/27/2021   PSA1 4.9 (H) 02/10/2021      9. Iron deficiency anemia, unspecified iron deficiency anemia type No c/o fatigue. Is on niferx daily Lab Results  Component Value Date   HGB 13.0 11/11/2021     10. Hyperlipidemia with target LDL less than 100 Does not really watch diet and is not as active as  he use to be. Lab Results  Component Value Date   CHOL 98 (L) 11/11/2021   HDL 43 11/11/2021   LDLCALC 37 11/11/2021   TRIG 94 11/11/2021   CHOLHDL 2.3 11/11/2021     11. Peripheral edema Has daily lower ext edema that will resolve some at night  12. Depression, major, single episode, moderate (HCC) Is on lexapro daily. Is doing well.    02/13/2022    2:30 PM 11/11/2021    2:21 PM 08/11/2021    2:29 PM  Depression screen PHQ 2/9  Decreased Interest 1 0 0  Down, Depressed, Hopeless 0 0 1  PHQ - 2 Score 1 0 1  Altered sleeping '3 3 3  '$ Tired, decreased energy '2 3 3  '$ Change in appetite 2 3 0  Feeling bad or failure about yourself  0 0 0  Trouble concentrating 2 0 3  Moving slowly or fidgety/restless 0 0 1  Suicidal thoughts 0 0 0  PHQ-9 Score '10 9 11  '$ Difficult doing work/chores Somewhat difficult Somewhat difficult Somewhat difficult     13. Late effect of cerebrovascular accident (CVA) No permante effects. He is on plavixdaily with no issues  14. Psoriatic arthritis Uses ultram for pain Pain assessment: Cause of pain- psoriatic arthritis Pain location- varies from day to day Pain on scale of 1-10- 6-7/10 Frequency- daily What increases pain-nothing really What makes pain Better-pain meds help  Effects on ADL - none Any change in general medical condition-none  Current opioids rx- ultram '50mg'$  BID # meds rx- 60 Effectiveness of current meds-helps Adverse reactions from pain meds-none Morphine equivalent- 10 MME  Pill count performed-No Last drug screen - 05/10/21 ( high risk q59m moderate risk q638mlow risk yearly ) Urine drug screen today- No [place]  If yes were their any concerning findings? - no   Overdose risk: 1    Pain contract signed on:05/17/21  New complaints: None today  Allergies  Allergen Reactions   Nsaids Shortness Of Breath   Aspirin Other (See Comments)    Affects patient breathing   Morphine And Related Hives and  Itching   Sulfa Antibiotics    Outpatient Encounter Medications as of 02/13/2022  Medication Sig   Accu-Chek Softclix Lancets lancets Test BS up to QID Dx E11.9   albuterol (VENTOLIN HFA) 108 (90 Base) MCG/ACT inhaler TAKE 2 PUFFS BY MOUTH EVERY 6 HOURS AS NEEDED FOR WHEEZE OR SHORTNESS OF BREATH   amLODipine (NORVASC) 5 MG tablet Take 1 tablet (5 mg total) by mouth daily.   aspirin 81 MG chewable tablet Chew 81 mg by mouth daily.   atorvastatin (LIPITOR) 40 MG tablet Take 1 tablet (40 mg total) by mouth daily.   blood glucose meter kit and supplies KIT Dispense based on patient and insurance preference. Use up to four times daily as directed. (FOR ICD-9 250.00, 250.01).   cholecalciferol (VITAMIN D3) 25 MCG (1000 UNIT) tablet Take 1 tablet (1,000 Units total) by mouth daily.   clopidogrel (PLAVIX) 75 MG tablet Take 1 tablet (75 mg total) by mouth daily.   escitalopram (LEXAPRO) 20 MG tablet Take 1 tablet (20 mg total) by mouth daily.   fluticasone furoate-vilanterol (BREO ELLIPTA) 100-25 MCG/ACT AEPB Inhale 1 puff into the lungs daily.   glucose blood (ACCU-CHEK GUIDE) test strip Test BS up to QID Dx E11.9   iron polysaccharides (NIFEREX) 150 MG capsule TAKE 1 CAPSULE BY MOUTH EVERY DAY   metFORMIN (GLUCOPHAGE) 500 MG tablet Take 1 tablet (500 mg total) by mouth 2 (two) times daily with a meal.   metoprolol succinate (TOPROL XL) 25 MG 24 hr tablet Take 0.5 tablets (12.5 mg total) by mouth daily.   olmesartan (BENICAR) 40 MG tablet Take 1 tablet (40 mg total) by mouth daily.   pantoprazole (PROTONIX) 40 MG tablet Take 1 tablet (40 mg total) by mouth 2 (two) times daily.   sucralfate (CARAFATE) 1 g tablet TAKE ONE TABLET FOUR TIMES DAILY BEFORE MEALS AND AT BEDTIME   traMADol (ULTRAM) 50 MG tablet Take 1 tablet (50 mg total) by mouth 2 (two) times daily.   No facility-administered encounter medications on file as of 02/13/2022.    Past Surgical History:  Procedure Laterality Date   collapsed  lung     HERNIA REPAIR     PROSTATE SURGERY      Family History  Problem Relation Age of Onset   Lung cancer Father    Prostate cancer Father    Stomach cancer Maternal Grandmother    Prostate cancer Paternal Uncle    Cervical cancer Daughter    Psoriasis Daughter    Arthritis Daughter        psoriatic arthritis        Review of Systems  Constitutional:  Negative for diaphoresis.  Eyes:  Negative for pain.  Respiratory:  Negative for shortness of breath.   Cardiovascular:  Negative for  chest pain, palpitations and leg swelling.  Gastrointestinal:  Negative for abdominal pain.  Endocrine: Negative for polydipsia.  Skin:  Negative for rash.  Neurological:  Negative for dizziness, weakness and headaches.  Hematological:  Does not bruise/bleed easily.  All other systems reviewed and are negative.      Objective:   Physical Exam Vitals and nursing note reviewed.  Constitutional:      Appearance: Normal appearance. He is well-developed.  HENT:     Head: Normocephalic.     Nose: Nose normal.     Mouth/Throat:     Mouth: Mucous membranes are moist.     Pharynx: Oropharynx is clear.  Eyes:     Pupils: Pupils are equal, round, and reactive to light.  Neck:     Thyroid: No thyroid mass or thyromegaly.     Vascular: No carotid bruit or JVD.     Trachea: Phonation normal.  Cardiovascular:     Rate and Rhythm: Normal rate and regular rhythm.  Pulmonary:     Effort: Pulmonary effort is normal. No respiratory distress.     Breath sounds: Normal breath sounds.  Abdominal:     General: Bowel sounds are normal.     Palpations: Abdomen is soft.     Tenderness: There is no abdominal tenderness.  Musculoskeletal:        General: Normal range of motion.     Cervical back: Normal range of motion and neck supple.  Lymphadenopathy:     Cervical: No cervical adenopathy.  Skin:    General: Skin is warm and dry.  Neurological:     Mental Status: He is alert and oriented to  person, place, and time.  Psychiatric:        Behavior: Behavior normal.        Thought Content: Thought content normal.        Judgment: Judgment normal.     BP 125/63   Pulse (!) 56   Temp (!) 97.3 F (36.3 C) (Temporal)   Resp 20   Ht '5\' 7"'$  (1.702 m)   Wt 186 lb (84.4 kg)   SpO2 96%   BMI 29.13 kg/m         Assessment & Plan:  Chester Sibert comes in today with chief complaint of Medical Management of Chronic Issues   Diagnosis and orders addressed:  1. Essential hypertension Low sodium diet - CBC with Differential/Platelet - CMP14+EGFR - amLODipine (NORVASC) 5 MG tablet; Take 1 tablet (5 mg total) by mouth daily.  Dispense: 90 tablet; Refill: 1 - olmesartan (BENICAR) 40 MG tablet; Take 1 tablet (40 mg total) by mouth daily.  Dispense: 90 tablet; Refill: 1  2. Aortic atherosclerosis ([place]) Keep follow up with cardiology  3. Carotid stenosis, symptomatic, with infarction [facility]) Will ask cardiology wen had last doppler  4. Mild intermittent chronic asthma without complication - fluticasone furoate-vilanterol (BREO ELLIPTA) 100-25 MCG/ACT AEPB; Inhale 1 puff into the lungs daily.  Dispense: 1 each; Refill: 11  5. Gastroesophageal reflux disease without esophagitis Avoid spicy foods Do not eat 2 hours prior to bedtime  - pantoprazole (PROTONIX) 40 MG tablet; Take 1 tablet (40 mg total) by mouth 2 (two) times daily.  Dispense: 180 tablet; Refill: 1  6. Diabetes mellitus without complication ([place]) Continue to watch carbs in diet - Bayer DCA Hb A1c Waived - metFORMIN (GLUCOPHAGE) 500 MG tablet; Take 1 tablet (500 mg total) by mouth 2 (two) times daily with a meal.  Dispense: 180 tablet; Refill:  3  7. Hypogonadism in male  8. Benign prostatic hyperplasia with lower urinary tract symptoms, symptom details unspecified Labs pending  9. Iron deficiency anemia, unspecified iron deficiency anemia type Labs pending - iron polysaccharides (NIFEREX) 150 MG capsule; TAKE 1  CAPSULE BY MOUTH EVERY DAY  Dispense: 90 capsule; Refill: 1  10. Hyperlipidemia with target LDL less than 100 Lo fat diet - Lipid panel - atorvastatin (LIPITOR) 40 MG tablet; Take 1 tablet (40 mg total) by mouth daily.  Dispense: 90 tablet; Refill: 1  11. Peripheral edema Elevate legs when sitting  12. Depression, major, single episode, moderate (HCC) Stress management - escitalopram (LEXAPRO) 20 MG tablet; Take 1 tablet (20 mg total) by mouth daily.  Dispense: 90 tablet; Refill: 1  13. Late effect of cerebrovascular accident (CVA) - clopidogrel (PLAVIX) 75 MG tablet; Take 1 tablet (75 mg total) by mouth daily.  Dispense: 90 tablet; Refill: 1  14. Psoriatic arthritis (HCC) - traMADol (ULTRAM) 50 MG tablet; Take 1 tablet (50 mg total) by mouth 2 (two) times daily.  Dispense: 60 tablet; Refill: 2  15. DOE (dyspnea on exertion) - albuterol (VENTOLIN HFA) 108 (90 Base) MCG/ACT inhaler; TAKE 2 PUFFS BY MOUTH EVERY 6 HOURS AS NEEDED FOR WHEEZE OR SHORTNESS OF BREATH  Dispense: 6.7 g; Refill: 1   Labs pending Health Maintenance reviewed Diet and exercise encouraged  Follow up plan: 3 months   Mary-Margaret Hassell Done, FNP

## 2022-02-14 LAB — CBC WITH DIFFERENTIAL/PLATELET
Basophils Absolute: 0.1 10*3/uL (ref 0.0–0.2)
Basos: 1 %
EOS (ABSOLUTE): 0.4 10*3/uL (ref 0.0–0.4)
Eos: 5 %
Hematocrit: 39.1 % (ref 37.5–51.0)
Hemoglobin: 12.7 g/dL — ABNORMAL LOW (ref 13.0–17.7)
Immature Grans (Abs): 0 10*3/uL (ref 0.0–0.1)
Immature Granulocytes: 0 %
Lymphocytes Absolute: 1.5 10*3/uL (ref 0.7–3.1)
Lymphs: 16 %
MCH: 28 pg (ref 26.6–33.0)
MCHC: 32.5 g/dL (ref 31.5–35.7)
MCV: 86 fL (ref 79–97)
Monocytes Absolute: 1 10*3/uL — ABNORMAL HIGH (ref 0.1–0.9)
Monocytes: 11 %
Neutrophils Absolute: 6.2 10*3/uL (ref 1.4–7.0)
Neutrophils: 67 %
Platelets: 293 10*3/uL (ref 150–450)
RBC: 4.53 x10E6/uL (ref 4.14–5.80)
RDW: 13.1 % (ref 11.6–15.4)
WBC: 9.2 10*3/uL (ref 3.4–10.8)

## 2022-02-14 LAB — LIPID PANEL
Chol/HDL Ratio: 2.5 ratio (ref 0.0–5.0)
Cholesterol, Total: 103 mg/dL (ref 100–199)
HDL: 42 mg/dL (ref >39)
LDL Chol Calc (NIH): 46 mg/dL (ref 0–99)
Triglycerides: 67 mg/dL (ref 0–149)
VLDL Cholesterol Cal: 15 mg/dL (ref 5–40)

## 2022-02-14 LAB — CMP14+EGFR
ALT: 15 IU/L (ref 0–44)
AST: 18 IU/L (ref 0–40)
Albumin/Globulin Ratio: 1.8 (ref 1.2–2.2)
Albumin: 4 g/dL (ref 3.8–4.8)
Alkaline Phosphatase: 92 IU/L (ref 44–121)
BUN/Creatinine Ratio: 17 (ref 10–24)
BUN: 19 mg/dL (ref 8–27)
Bilirubin Total: 0.3 mg/dL (ref 0.0–1.2)
CO2: 24 mmol/L (ref 20–29)
Calcium: 10.7 mg/dL — ABNORMAL HIGH (ref 8.6–10.2)
Chloride: 102 mmol/L (ref 96–106)
Creatinine, Ser: 1.1 mg/dL (ref 0.76–1.27)
Globulin, Total: 2.2 g/dL (ref 1.5–4.5)
Glucose: 106 mg/dL — ABNORMAL HIGH (ref 70–99)
Potassium: 4.3 mmol/L (ref 3.5–5.2)
Sodium: 141 mmol/L (ref 134–144)
Total Protein: 6.2 g/dL (ref 6.0–8.5)
eGFR: 69 mL/min/{1.73_m2} (ref >59)

## 2022-03-08 DIAGNOSIS — L570 Actinic keratosis: Secondary | ICD-10-CM | POA: Diagnosis not present

## 2022-04-10 DIAGNOSIS — I6523 Occlusion and stenosis of bilateral carotid arteries: Secondary | ICD-10-CM | POA: Diagnosis not present

## 2022-04-14 ENCOUNTER — Encounter: Payer: Self-pay | Admitting: Student

## 2022-04-14 ENCOUNTER — Ambulatory Visit: Payer: Medicare PPO | Attending: Student | Admitting: Student

## 2022-04-14 VITALS — BP 112/56 | HR 54 | Ht 67.5 in | Wt 188.0 lb

## 2022-04-14 DIAGNOSIS — E785 Hyperlipidemia, unspecified: Secondary | ICD-10-CM | POA: Diagnosis not present

## 2022-04-14 DIAGNOSIS — I6523 Occlusion and stenosis of bilateral carotid arteries: Secondary | ICD-10-CM | POA: Diagnosis not present

## 2022-04-14 DIAGNOSIS — I10 Essential (primary) hypertension: Secondary | ICD-10-CM | POA: Diagnosis not present

## 2022-04-14 DIAGNOSIS — I493 Ventricular premature depolarization: Secondary | ICD-10-CM | POA: Diagnosis not present

## 2022-04-14 NOTE — Progress Notes (Signed)
Cardiology Office Note    Date:  04/14/2022  ID:  Rodney DevoidJack Kilfoyle, DOB 06-24-1945, MRN [id number] Cardiologist: Dina RichBranch, Jonathan, MD    History of Present Illness:    Rodney Hernandez is a 77 y.o. male with past medical history of carotid artery stenosis (s/p L CEA in 01/2020), HTN, HLD, COPD, prior tobacco use and history of CVA (occurring in 01/2020 and received tPA and required thrombectomy) who presents to the office today for 6963-month follow-up.  He was examined by myself in 09/2021 and denied any recent exertional chest pain. He did report worsening memory issues over the past 6 months and episodic dizziness. He was continued on his current medical therapy including ASA and Plavix (on this per Vascular), Amlodipine 5 mg daily, Olmesartan 40 mg daily and Atorvastatin 40 mg daily. He did have upcoming follow-up with his Neurologist and it was recommended if they did not see any culprit for his dizziness, could place a Zio patch to quantify his PVC burden.  He did ultimately wear a monitor in 11/2021 and this showed predominantly normal sinus rhythm with an average heart rate of 62 bpm. He had triplets of PVC's and self terminating episodes of SVT with the longest being 22 beats. His overall PVC burden was 4.6%. It was recommended to try Toprol-XL 12.5 mg daily to see if this would help with his PVC's.  In talking with the patient and his daughter today, he reports still having episodic dizziness which has been occurring for the past few years. He feels like symptoms might be secondary to an ENT or sinus etiology since it is worse with changes in the weather or allergies. Experiences some presyncopal episodes but no actual syncope. He does not check his vitals when this occurs. He denies any recent dyspnea on exertion, orthopnea, PND or pitting edema. Does experience occasional swelling along the top of his feet. No recent chest pain or palpitations.  He reports having frequent coughing spells when eating at  times and feels drained from his significant cough when this occurs. We discussed that he should follow-up with GI for this as he may benefit from an Endoscopy or Speech eval (reports he is due for colonoscopy as well and wishes [place]   Studies Reviewed:   EKG: EKG is not ordered today. EKG from 09/2021 is reviewed and shows NSR, HR 71 with PVC's and no acute ST changes.   Echocardiogram: 01/2020 Left Ventricle  Normal left ventricular size. Wall thickness is normal. Systolic function is normal. EF: 60-65%. Wall motion is normal. Doppler parameters consistent with mild diastolic dysfunction and low to normal LA pressure.   Right Ventricle  Right ventricle is normal. Systolic function is normal.   Left Atrium  Left atrium is mildly dilated.   Right Atrium  Normal sized right atrium.   IVC/SVC  The inferior vena cava demonstrates a diameter of <=2.1 cm and collapses >50%; therefore, the right atrial pressure is estimated at 3 mmHg.   Mitral Valve  Mitral valve structure is normal. There is trace regurgitation.   Tricuspid Valve  Tricuspid valve structure is normal. There is trace regurgitation. The right ventricular systolic pressure is normal (<36 mmHg).   Aortic Valve  The aortic valve is tricuspid. The leaflets are not thickened and exhibit normal excursion. Trace regurgitation. There is no evidence of aortic valve stenosis.   Pulmonic Valve  The pulmonic valve was not well visualized. Trace regurgitation.   Ascending  Aorta  The aortic root is normal in size.   Pericardium  There is no pericardial effusion.   Event Monitor: 12/2021  Underlying rhythm is Sinus.   The minimum heart rate was 42 bpm at 4:46 am; the maximum 116 bpm; the average 62 bpm.   Patient remained bradycardic 42.3 % of total time.  Patient remained tachycardic 0.07 % of total time.   18 runs of 3 beat wide-complex tachycardia noted. Longest and fastest  episode was 3 beat with HR 153 bpm on Day 4 / 05:57 pm.  These episodes likely represent triplet of multifocal PVCs.  Patient remained asymptomatic during these episodes.   9 supraventricular episodes were found.  Patient remained asymptomatic during these episodes.  Longest SVT episode 22 beat run with HR 116 at 11:53 pm.  Fastest SVT 3 beat run with HR 144 bpm at 11:08 am.  It appeared like PAC and then couplet PACs.  10 beat run of narrow complex tachycardia with heart rate 126 bpm at 7:19 PM.   There were a total of 28,025 PVCs with 337 couplets. Overall PVC Burden at 4.6%.   There were a total of 3,373 PSVCs with 13 couplets. Overall PSVC Burden at 0.6 %.   There is a total of 1 patient event. The strips revealed sinus rhythm with HR 70 with few isolated PVCs but no other arrhythmias noted.   The longest R-R interval is 1.4 seconds at 4:46 am at November 7.   The average Qtc is 435 msec. Min Qtc 408 msec, maximum Qtc 478 msec.  Qtc was > 450 msec  20.7 % of total time.   No A. fib, no long pauses 3 seconds or more noted.    Carotid Dopplers: 04/2022 Conclusion:   Right: <50% ICA stenosis.  Atypical antegrade vertebral flow is noted.   Left:  No evidence of ICA stenosis, S/P CEA.  Antegrade vertebral flow is  noted.    Physical Exam:   VS:  BP (!) 112/56   Pulse (!) 54   Ht 5' 7.5" (1.715 m)   Wt 188 lb (85.3 kg)   SpO2 95%   BMI 29.01 kg/m    Wt Readings from Last 3 Encounters:  04/14/22 188 lb (85.3 kg)  02/13/22 186 lb (84.4 kg)  11/11/21 183 lb (83 kg)     GEN: Pleasant, elderly male appearing in no acute distress NECK: No JVD; No carotid bruits CARDIAC: RRR, no murmurs, rubs, gallops RESPIRATORY:  Clear to auscultation without rales, wheezing or rhonchi  ABDOMEN: Appears non-distended. No obvious abdominal masses. EXTREMITIES: No clubbing or cyanosis. No pitting edema.  Distal pedal pulses are 2+ bilaterally.   Assessment and Plan:   1. PVC's - Prior  monitor last year showed a PVC burden of 4.6% as outlined above. He does not believe he experienced improvement in his dizziness following initiation of Toprol-XL and given his occasional episodes of presyncope and bradycardia, I am concerned this could be exacerbating some of his symptoms. I recommended that he stop Toprol-XL 12.5 mg daily for now and make [place] aware if his dizziness worsens with the medication changes as it could be resumed in the future.   2. Carotid Artery Stenosis - He is s/p L CEA in 01/2020. Followed by Vascular Surgery and recent carotid dopplers earlier this month showed less than 50% RICA stenosis and no evidence of LICA stenosis. - Remains on ASA  daily and Plavix  daily per Vascular Surgery. Continue statin therapy.  3. HTN - His BP is at 112/56 during today's visit. Continue Amlodipine 5 mg daily and Olmesartan 40 mg daily. Will stop Toprol-XL 12.5mg  daily as discussed above.   4. HLD - FLP in 02/2022 showed total cholesterol 103, triglycerides 67, HDL 42 and LDL 46. Continue Atorvastatin 40mg  daily.    Signed, Ellsworth Lennox, PA-C

## 2022-04-14 NOTE — Patient Instructions (Signed)
Medication Instructions:  Your physician has recommended you make the following change in your medication:   Stop Taking Toprol XL   *If you need a refill on your cardiac medications before your next appointment, please call your pharmacy*   Lab Work: NONE   If you have labs (blood work) drawn today and your tests are completely normal, you will receive your results only by: MyChart Message (if you have MyChart) OR A paper copy in the mail If you have any lab test that is abnormal or we need to change your treatment, we will call you to review the results.   Testing/Procedures: NONE    Follow-Up: At [facility], you and your health needs are our priority.  As part of our continuing mission to provide you with exceptional heart care, we have created designated Provider Care Teams.  These Care Teams include your primary Cardiologist (physician) and Advanced Practice Providers (APPs -  Physician Assistants and Nurse Practitioners) who all work together to provide you with the care you need, when you need it.  We recommend signing up for the patient portal called "MyChart".  Sign up information is provided on this After Visit Summary.  MyChart is used to connect with patients for Virtual Visits (Telemedicine).  Patients are able to view lab/test results, encounter notes, upcoming appointments, etc.  Non-urgent messages can be sent to your provider as well.   To learn more about what you can do with MyChart, go to [email or web address].    Your next appointment:   6 month(s)  Provider:   You may see Dina Rich, MD or one of the following Advanced Practice Providers on your designated Care Team:   Randall An, PA-C  Jacolyn Reedy, [place]     Other Instructions Thank you for choosing [facility]!

## 2022-05-15 ENCOUNTER — Encounter: Payer: Self-pay | Admitting: Nurse Practitioner

## 2022-05-15 ENCOUNTER — Ambulatory Visit (INDEPENDENT_AMBULATORY_CARE_PROVIDER_SITE_OTHER): Payer: Medicare PPO | Admitting: Nurse Practitioner

## 2022-05-15 VITALS — BP 150/68 | HR 54 | Temp 97.2°F | Resp 20 | Ht 67.0 in | Wt 187.0 lb

## 2022-05-15 DIAGNOSIS — E119 Type 2 diabetes mellitus without complications: Secondary | ICD-10-CM

## 2022-05-15 DIAGNOSIS — K219 Gastro-esophageal reflux disease without esophagitis: Secondary | ICD-10-CM

## 2022-05-15 DIAGNOSIS — I700 Atherosclerosis of aorta: Secondary | ICD-10-CM

## 2022-05-15 DIAGNOSIS — R972 Elevated prostate specific antigen [PSA]: Secondary | ICD-10-CM

## 2022-05-15 DIAGNOSIS — I63239 Cerebral infarction due to unspecified occlusion or stenosis of unspecified carotid arteries: Secondary | ICD-10-CM

## 2022-05-15 DIAGNOSIS — N401 Enlarged prostate with lower urinary tract symptoms: Secondary | ICD-10-CM

## 2022-05-15 DIAGNOSIS — I6930 Unspecified sequelae of cerebral infarction: Secondary | ICD-10-CM

## 2022-05-15 DIAGNOSIS — E1169 Type 2 diabetes mellitus with other specified complication: Secondary | ICD-10-CM

## 2022-05-15 DIAGNOSIS — R600 Localized edema: Secondary | ICD-10-CM

## 2022-05-15 DIAGNOSIS — I10 Essential (primary) hypertension: Secondary | ICD-10-CM

## 2022-05-15 DIAGNOSIS — E785 Hyperlipidemia, unspecified: Secondary | ICD-10-CM

## 2022-05-15 DIAGNOSIS — J4520 Mild intermittent asthma, uncomplicated: Secondary | ICD-10-CM

## 2022-05-15 DIAGNOSIS — E291 Testicular hypofunction: Secondary | ICD-10-CM | POA: Diagnosis not present

## 2022-05-15 DIAGNOSIS — D509 Iron deficiency anemia, unspecified: Secondary | ICD-10-CM | POA: Diagnosis not present

## 2022-05-15 DIAGNOSIS — Z7984 Long term (current) use of oral hypoglycemic drugs: Secondary | ICD-10-CM

## 2022-05-15 DIAGNOSIS — L4050 Arthropathic psoriasis, unspecified: Secondary | ICD-10-CM

## 2022-05-15 DIAGNOSIS — Z125 Encounter for screening for malignant neoplasm of prostate: Secondary | ICD-10-CM | POA: Diagnosis not present

## 2022-05-15 LAB — BAYER DCA HB A1C WAIVED: HB A1C (BAYER DCA - WAIVED): 7.2 % — ABNORMAL HIGH (ref 4.8–5.6)

## 2022-05-15 MED ORDER — TRAMADOL HCL 50 MG PO TABS: 50.0000 mg | ORAL_TABLET | Freq: Two times a day (BID) | ORAL | 2 refills | Status: DC

## 2022-05-15 NOTE — Progress Notes (Signed)
Subjective:    Patient ID: Rodney Hernandez, male    DOB: May 19, 1945, 77 y.o.   MRN: [id number]   Chief Complaint: medical management of chronic issues     HPI:  Rodney Hernandez is a 77 y.o. who identifies as a male who was assigned male at birth.   Social history: Lives with: by himself- his daughters check on him daily Work history: retired   Water engineer in today for follow up of the following chronic medical issues:  1. Essential hypertension No c/o chest pain, sob or headache. Doe snot check blood pressure at home. BP Readings from Last 3 Encounters:  04/14/22 (!) 112/56  02/13/22 125/63  11/11/21 111/66     2. Aortic atherosclerosis (HCC) 3. Carotid stenosis, symptomatic, with infarction [facility]) Patient seen dr. [place] Ouch 10/07/21. He was experiencing dizziness and they did not feel it was heart related. They did place a zio o him which was negative. He saw vascular and had carotid doppler which was unchanged since endarectomy in 2022.  4. Mild intermittent chronic asthma without complication Is on BREO daily and is doing well.  5. Hyperlipidemia associated with type 2 diabetes mellitus (HCC) Does not watch diet and does no dedicated exercise. This SmartLink has not been configured with any valid records.   Lab Results  Component Value Date   CHOL 103 02/13/2022   HDL 42 02/13/2022   LDLCALC 46 02/13/2022   TRIG 67 02/13/2022   CHOLHDL 2.5 02/13/2022     6. Diabetes mellitus without complication (HCC) He has not been checking hisblood sugars at home. Lab Results  Component Value Date   HGBA1C 7.0 (H) 02/13/2022     7. Gastroesophageal reflux disease without esophagitis Is on protonix daily and will have symptoms if does not take meds  8. Hypogonadism in male Is not on testosterone currently  9. Benign prostatic hyperplasia with lower urinary tract symptoms, symptom details unspecified No voiding issues. Saw urology on 12/20/21. Office note reviewd- no change in plan  of care.   10. Iron deficiency anemia, unspecified iron deficiency anemia type Is on niferex dialy and is doing well. Lab Results  Component Value Date   HGB 12.7 (L) 02/13/2022     11. Elevated prostate specific antigen (PSA) No voiding issues Lab Results  Component Value Date   PSA1 0.8 11/11/2021   PSA1 2.2 05/27/2021   PSA1 4.9 (H) 02/10/2021      12. Peripheral edema Has daily edema that resolves at night   New complaints: None today  Allergies  Allergen Reactions   Nsaids Shortness Of Breath   Aspirin Other (See Comments)    Affects patient breathing   Morphine And Related Hives and Itching   Sulfa Antibiotics    Outpatient Encounter Medications as of 05/15/2022  Medication Sig   Accu-Chek Softclix Lancets lancets Test BS up to QID Dx E11.9   albuterol (VENTOLIN HFA) 108 (90 Base) MCG/ACT inhaler TAKE 2 PUFFS BY MOUTH EVERY 6 HOURS AS NEEDED FOR WHEEZE OR SHORTNESS OF BREATH   amLODipine (NORVASC) 5 MG tablet Take 1 tablet (5 mg total) by mouth daily.   aspirin 81 MG chewable tablet Chew 81 mg by mouth daily.   atorvastatin (LIPITOR) 40 MG tablet Take 1 tablet (40 mg total) by mouth daily.   blood glucose meter kit and supplies KIT Dispense based on patient and insurance preference. Use up to four times daily as directed. (FOR ICD-9 250.00, 250.01).   cholecalciferol (VITAMIN D3) 25 MCG (  1000 UNIT) tablet Take 1 tablet (1,000 Units total) by mouth daily.   clopidogrel (PLAVIX) 75 MG tablet Take 1 tablet (75 mg total) by mouth daily.   escitalopram (LEXAPRO) 20 MG tablet Take 1 tablet (20 mg total) by mouth daily.   fluticasone furoate-vilanterol (BREO ELLIPTA) 100-25 MCG/ACT AEPB Inhale 1 puff into the lungs daily.   glucose blood (ACCU-CHEK GUIDE) test strip Test BS up to QID Dx E11.9   iron polysaccharides (NIFEREX) 150 MG capsule TAKE 1 CAPSULE BY MOUTH EVERY DAY   metFORMIN (GLUCOPHAGE) 500 MG tablet Take 1 tablet (500 mg total) by mouth 2 (two) times daily  with a meal.   olmesartan (BENICAR) 40 MG tablet Take 1 tablet (40 mg total) by mouth daily.   pantoprazole (PROTONIX) 40 MG tablet Take 1 tablet (40 mg total) by mouth 2 (two) times daily.   sucralfate (CARAFATE) 1 g tablet TAKE ONE TABLET FOUR TIMES DAILY BEFORE MEALS AND AT BEDTIME   traMADol (ULTRAM) 50 MG tablet Take 1 tablet (50 mg total) by mouth 2 (two) times daily.   No facility-administered encounter medications on file as of 05/15/2022.    Past Surgical History:  Procedure Laterality Date   collapsed lung     HERNIA REPAIR     PROSTATE SURGERY      Family History  Problem Relation Age of Onset   Lung cancer Father    Prostate cancer Father    Stomach cancer Maternal Grandmother    Prostate cancer Paternal Uncle    Cervical cancer Daughter    Psoriasis Daughter    Arthritis Daughter        psoriatic arthritis      Controlled substance contract: n/a     Review of Systems  Constitutional:  Negative for diaphoresis.  Eyes:  Negative for pain.  Respiratory:  Negative for shortness of breath.   Cardiovascular:  Negative for chest pain, palpitations and leg swelling.  Gastrointestinal:  Negative for abdominal pain.  Endocrine: Negative for polydipsia.  Skin:  Negative for rash.  Neurological:  Negative for dizziness, weakness and headaches.  Hematological:  Does not bruise/bleed easily.  All other systems reviewed and are negative.      Objective:   Physical Exam Vitals and nursing note reviewed.  Constitutional:      Appearance: Normal appearance. He is well-developed.  HENT:     Head: Normocephalic.     Nose: Nose normal.     Mouth/Throat:     Mouth: Mucous membranes are moist.     Pharynx: Oropharynx is clear.  Eyes:     Pupils: Pupils are equal, round, and reactive to light.  Neck:     Thyroid: No thyroid mass or thyromegaly.     Vascular: No carotid bruit or JVD.     Trachea: Phonation normal.  Cardiovascular:     Rate and Rhythm: Normal rate  and regular rhythm.  Pulmonary:     Effort: Pulmonary effort is normal. No respiratory distress.     Breath sounds: Normal breath sounds.  Abdominal:     General: Bowel sounds are normal.     Palpations: Abdomen is soft.     Tenderness: There is no abdominal tenderness.  Musculoskeletal:        General: Normal range of motion.     Cervical back: Normal range of motion and neck supple.  Lymphadenopathy:     Cervical: No cervical adenopathy.  Skin:    General: Skin is warm and dry.  Neurological:     Mental Status: He is alert and oriented to person, place, and time.  Psychiatric:        Behavior: Behavior normal.        Thought Content: Thought content normal.        Judgment: Judgment normal.     BP (!) 150/68   Pulse (!) 54   Temp (!) 97.2 F (36.2 C) (Temporal)   Resp 20   Ht 5\' 7"  (1.702 m)   Wt 187 lb (84.8 kg)   SpO2 99%   BMI 29.29 kg/m        Assessment & Plan:  Hugo Niedzwiecki comes in today with chief complaint of Medical Management of Chronic Issues   Diagnosis and orders addressed:  1. Essential hypertension Low sodium diet - CBC with Differential/Platelet - CMP14+EGFR  2. Aortic atherosclerosis (HCC) 3. Carotid stenosis, symptomatic, with infarction (HCC) 4. Mild intermittent chronic asthma without complication Keep follow up with cardiology  5. Hyperlipidemia associated with type 2 diabetes mellitus (HCC) Low fat diet - Lipid panel  6. Diabetes mellitus without complication (HCC) Continue to watch carbs in diet - Bayer DCA Hb A1c Waived  7. Gastroesophageal reflux disease without esophagitis Avoid spicy foods Do not eat 2 hours prior to bedtime   8. Hypogonadism in male  88. Benign prostatic hyperplasia with lower urinary tract symptoms, symptom details unspecified Labs pending  10. Iron deficiency anemia, unspecified iron deficiency anemia type Continue iron supplement  11. Elevated prostate specific antigen (PSA) Labs oending -  PSA, total and free  12. Peripheral edema Elevate legs when sitting  13. Psoriatic arthritis (HCC) - traMADol (ULTRAM) 50 MG tablet; Take 1 tablet (50 mg total) by mouth 2 (two) times daily.  Dispense: 60 tablet; Refill: 2   Labs pending Health Maintenance reviewed Diet and exercise encouraged  Follow up plan: 3 months   Mary-Margaret Daphine Deutscher, FNP

## 2022-05-15 NOTE — Patient Instructions (Signed)
Vertigo Vertigo is the feeling that you or the things around you are moving when they are not. This feeling can come and go at any time. Vertigo often goes away on its own. This condition can be dangerous if it happens when you are doing activities like driving or working with machines. Your doctor will do tests to find the cause of your vertigo. These tests will also help your doctor decide on the best treatment for you. Follow these instructions at home: Eating and drinking     Drink enough fluid to keep your pee (urine) pale yellow. Do not drink alcohol. Activity Return to your normal activities when your doctor says that it is safe. In the morning, first sit up on the side of the bed. When you feel okay, stand slowly while you hold onto something until you know that your balance is fine. Move slowly. Avoid sudden body or head movements or certain positions, as told by your doctor. Use a cane if you have trouble standing or walking. Sit down right away if you feel dizzy. Avoid doing any tasks or activities that can cause danger to you or others if you get dizzy. Avoid bending down if you feel dizzy. Place items in your home so that they are easy for you to reach without bending or leaning over. Do not drive or use machinery if you feel dizzy. General instructions Take over-the-counter and prescription medicines only as told by your doctor. Keep all follow-up visits. Contact a doctor if: Your medicine does not help your vertigo. Your problems get worse or you have new symptoms. You have a fever. You feel like you may vomit (nauseous), or this feeling gets worse. You start to vomit. Your family or friends see changes in how you act. You lose feeling (have numbness) in part of your body. You feel prickling and tingling in a part of your body. Get help right away if: You are always dizzy. You faint. You get very bad headaches. You get a stiff neck. Bright light starts to bother  you. You have trouble moving or talking. You feel weak in your hands, arms, or legs. You have changes in your hearing or in how you see (vision). These symptoms may be an emergency. Get help right away. Call your local emergency services (911 in the [place] Do not wait to see if the symptoms will go away. Do not drive yourself to the hospital. Summary Vertigo is the feeling that you or the things around you are moving when they are not. Your doctor will do tests to find the cause of your vertigo. You may be told to avoid some tasks, positions, or movements. Contact a doctor if your medicine is not helping, or if you have a fever, new symptoms, or a change in how you act. Get help right away if you get very bad headaches, or if you have changes in how you speak, hear, or see. This information is not intended to replace advice given to you by your health care provider. Make sure you discuss any questions you have with your health care provider. Document Revised: 11/26/2019 Document Reviewed: 11/26/2019 Elsevier Patient Education  2023 [employer]  

## 2022-05-16 LAB — CBC WITH DIFFERENTIAL/PLATELET
Basophils Absolute: 0.1 10*3/uL (ref 0.0–0.2)
Basos: 1 %
EOS (ABSOLUTE): 0.4 10*3/uL (ref 0.0–0.4)
Eos: 4 %
Hematocrit: 39.2 % (ref 37.5–51.0)
Hemoglobin: 12.4 g/dL — ABNORMAL LOW (ref 13.0–17.7)
Immature Grans (Abs): 0 10*3/uL (ref 0.0–0.1)
Immature Granulocytes: 0 %
Lymphocytes Absolute: 1.8 10*3/uL (ref 0.7–3.1)
Lymphs: 16 %
MCH: 27.3 pg (ref 26.6–33.0)
MCHC: 31.6 g/dL (ref 31.5–35.7)
MCV: 86 fL (ref 79–97)
Monocytes Absolute: 1 10*3/uL — ABNORMAL HIGH (ref 0.1–0.9)
Monocytes: 9 %
Neutrophils Absolute: 8 10*3/uL — ABNORMAL HIGH (ref 1.4–7.0)
Neutrophils: 70 %
Platelets: 297 10*3/uL (ref 150–450)
RBC: 4.54 x10E6/uL (ref 4.14–5.80)
RDW: 13.1 % (ref 11.6–15.4)
WBC: 11.3 10*3/uL — ABNORMAL HIGH (ref 3.4–10.8)

## 2022-05-16 LAB — PSA, TOTAL AND FREE
PSA, Free Pct: <6.7 %
PSA, Free: <0.02 ng/mL
Prostate Specific Ag, Serum: 0.3 ng/mL (ref 0.0–4.0)

## 2022-05-16 LAB — CMP14+EGFR
ALT: 13 IU/L (ref 0–44)
AST: 15 IU/L (ref 0–40)
Albumin/Globulin Ratio: 2.3 — ABNORMAL HIGH (ref 1.2–2.2)
Albumin: 4.2 g/dL (ref 3.8–4.8)
Alkaline Phosphatase: 100 IU/L (ref 44–121)
BUN/Creatinine Ratio: 19 (ref 10–24)
BUN: 19 mg/dL (ref 8–27)
Bilirubin Total: 0.2 mg/dL (ref 0.0–1.2)
CO2: 24 mmol/L (ref 20–29)
Calcium: 10.6 mg/dL — ABNORMAL HIGH (ref 8.6–10.2)
Chloride: 105 mmol/L (ref 96–106)
Creatinine, Ser: 1.02 mg/dL (ref 0.76–1.27)
Globulin, Total: 1.8 g/dL (ref 1.5–4.5)
Glucose: 158 mg/dL — ABNORMAL HIGH (ref 70–99)
Potassium: 4.8 mmol/L (ref 3.5–5.2)
Sodium: 142 mmol/L (ref 134–144)
Total Protein: 6 g/dL (ref 6.0–8.5)
eGFR: 75 mL/min/{1.73_m2} (ref >59)

## 2022-05-16 LAB — LIPID PANEL
Chol/HDL Ratio: 2.5 ratio (ref 0.0–5.0)
Cholesterol, Total: 104 mg/dL (ref 100–199)
HDL: 42 mg/dL (ref >39)
LDL Chol Calc (NIH): 37 mg/dL (ref 0–99)
Triglycerides: 148 mg/dL (ref 0–149)
VLDL Cholesterol Cal: 25 mg/dL (ref 5–40)

## 2022-06-21 DIAGNOSIS — N400 Enlarged prostate without lower urinary tract symptoms: Secondary | ICD-10-CM | POA: Diagnosis not present

## 2022-06-21 DIAGNOSIS — Z1330 Encounter for screening examination for mental health and behavioral disorders, unspecified: Secondary | ICD-10-CM | POA: Diagnosis not present

## 2022-06-21 DIAGNOSIS — C61 Malignant neoplasm of prostate: Secondary | ICD-10-CM | POA: Diagnosis not present

## 2022-06-26 ENCOUNTER — Ambulatory Visit (INDEPENDENT_AMBULATORY_CARE_PROVIDER_SITE_OTHER): Payer: Medicare PPO

## 2022-06-26 VITALS — Ht 67.0 in | Wt 187.0 lb

## 2022-06-26 DIAGNOSIS — Z0000 Encounter for general adult medical examination without abnormal findings: Secondary | ICD-10-CM | POA: Diagnosis not present

## 2022-06-26 NOTE — Progress Notes (Signed)
Subjective:   Chi Illescas is a 77 y.o. male who presents for Medicare Annual/Subsequent preventive examination. I connected with  Gevorg Utrera on 06/26/22 by a audio enabled telemedicine application and verified that I am speaking with the correct person using two identifiers.  Patient Location: Home  Provider Location: Home Office  I discussed the limitations of evaluation and management by telemedicine. The patient expressed understanding and agreed to proceed.  Review of Systems     Cardiac Risk Factors include: advanced age (>43men, >30 women);male gender;dyslipidemia;hypertension     Objective:    Today's Vitals   06/26/22 1204  Weight: 187 lb (84.8 kg)  Height: 5\' 7"  (1.702 m)   Body mass index is 29.29 kg/m.     06/26/2022   12:10 PM 06/22/2021   12:03 PM 06/08/2020    3:42 PM 05/08/2019    1:54 PM 12/03/2018    4:00 AM 05/07/2018   11:55 AM 05/07/2018   11:54 AM  Advanced Directives  Does Patient Have a Medical Advance Directive? No Yes Yes Yes Yes  No  Type of Special educational needs teacher of [place];Living will Healthcare Power of [place];Living will Living will;Healthcare Power of [employer] Power of Attorney    Does patient want to make changes to medical advance directive?    No - Patient declined No - Patient declined    Copy of Healthcare Power of Attorney in Chart?  No - copy requested No - copy requested No - copy requested No - copy requested    Would patient like information on creating a medical advance directive? No - Patient declined     Yes (MAU/Ambulatory/Procedural Areas - Information given) No - Patient declined    Current Medications (verified) Outpatient Encounter Medications as of 06/26/2022  Medication Sig   Accu-Chek Softclix Lancets lancets Test BS up to QID Dx E11.9   albuterol (VENTOLIN HFA) 108 (90 Base) MCG/ACT inhaler TAKE 2 PUFFS BY MOUTH EVERY 6 HOURS AS NEEDED FOR WHEEZE OR SHORTNESS OF BREATH   amLODipine (NORVASC) 5  MG tablet Take 1 tablet (5 mg total) by mouth daily.   aspirin 81 MG chewable tablet Chew 81 mg by mouth daily.   atorvastatin (LIPITOR) 40 MG tablet Take 1 tablet (40 mg total) by mouth daily.   blood glucose meter kit and supplies KIT Dispense based on patient and insurance preference. Use up to four times daily as directed. (FOR ICD-9 250.00, 250.01).   cholecalciferol (VITAMIN D3) 25 MCG (1000 UNIT) tablet Take 1 tablet (1,000 Units total) by mouth daily.   clopidogrel (PLAVIX) 75 MG tablet Take 1 tablet (75 mg total) by mouth daily.   escitalopram (LEXAPRO) 20 MG tablet Take 1 tablet (20 mg total) by mouth daily.   fluticasone furoate-vilanterol (BREO ELLIPTA) 100-25 MCG/ACT AEPB Inhale 1 puff into the lungs daily.   glucose blood (ACCU-CHEK GUIDE) test strip Test BS up to QID Dx E11.9   iron polysaccharides (NIFEREX) 150 MG capsule TAKE 1 CAPSULE BY MOUTH EVERY DAY   metFORMIN (GLUCOPHAGE) 500 MG tablet Take 1 tablet (500 mg total) by mouth 2 (two) times daily with a meal.   metoprolol succinate (TOPROL-XL) 25 MG 24 hr tablet Take 12.5 mg by mouth daily.   olmesartan (BENICAR) 40 MG tablet Take 1 tablet (40 mg total) by mouth daily.   pantoprazole (PROTONIX) 40 MG tablet Take 1 tablet (40 mg total) by mouth 2 (two) times daily.   sucralfate (CARAFATE) 1 g tablet TAKE ONE  TABLET FOUR TIMES DAILY BEFORE MEALS AND AT BEDTIME   traMADol (ULTRAM) 50 MG tablet Take 1 tablet (50 mg total) by mouth 2 (two) times daily.   No facility-administered encounter medications on file as of 06/26/2022.    Allergies (verified) Nsaids, Aspirin, Morphine and codeine, and Sulfa antibiotics   History: Past Medical History:  Diagnosis Date   Arthritis    psoriatic arthritis and osteo arthritis   Asthma    Cancer (HCC)    Prostate   Carotid artery stenosis    a.s/p L CEA in 01/2020   Collapse of lung    Colon polyps    DOE (dyspnea on exertion) 12/02/2018   Eczema    GERD (gastroesophageal reflux  disease)    Hypertension    Seasonal allergies    Stroke (HCC)    Vertigo    Past Surgical History:  Procedure Laterality Date   collapsed lung     HERNIA REPAIR     PROSTATE SURGERY     Family History  Problem Relation Age of Onset   Lung cancer Father    Prostate cancer Father    Stomach cancer Maternal Grandmother    Prostate cancer Paternal Uncle    Cervical cancer Daughter    Psoriasis Daughter    Arthritis Daughter        psoriatic arthritis   Social History   Socioeconomic History   Marital status: Widowed    Spouse name: Not on file   Number of children: 2   Years of education: 12   Highest education level: High school graduate  Occupational History   Occupation: Retired    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   Right handed   12th grade   3 cups daily   His 2 daughters live within half a mile from him - both help him a lot   Social Determinants of Health   Financial Resource Strain: Low Risk  (06/26/2022)   Overall Financial Resource Strain (CARDIA)    Difficulty of Paying Living Expenses: Not hard at all  Food Insecurity: No Food Insecurity (06/26/2022)   Hunger Vital Sign    Worried About Running Out of Food in the Last Year: Never true    Ran Out of Food in the Last Year: Never true  Transportation Needs: No Transportation Needs (06/26/2022)   PRAPARE - Administrator, Civil Service (Medical): No    Lack of Transportation (Non-Medical): No  Physical Activity: Insufficiently  Active (06/26/2022)   Exercise Vital Sign    Days of Exercise per Week: 3 days    Minutes of Exercise per Session: 30 min  Stress: No Stress Concern Present (06/26/2022)   [employer] of Occupational Health - Occupational Stress Questionnaire    Feeling of Stress : Not at all  Social Connections: Moderately Isolated (06/26/2022)   Social Connection and Isolation  Panel [NHANES]    Frequency of Communication with Friends and Family: More than three times a week    Frequency of Social Gatherings with Friends and Family: More than three times a week    Attends Religious Services: More than 4 times per year    Active Member of [employer] or Organizations: No    Attends Banker Meetings: Never    Marital Status: Widowed    Tobacco Counseling Counseling given: Not Answered   Clinical Intake:  Pre-visit preparation completed: Yes  Pain : No/denies pain     Nutritional Risks: None Diabetes: Yes CBG done?: No Did pt. bring in CBG monitor from home?: No  How often do you need to have someone help you when you read instructions, pamphlets, or other written materials from your doctor or pharmacy?: 1 - Never  Diabetic?yes  Nutrition Risk Assessment:  Has the patient had any N/V/D within the last 2 months?  No  Does the patient have any non-healing wounds?  No  Has the patient had any unintentional weight loss or weight gain?  No   Diabetes:  Is the patient diabetic?  Yes  If diabetic, was a CBG obtained today?  No  Did the patient bring in their glucometer from home?  No  How often do you monitor your CBG's? Never .   Financial Strains and Diabetes Management:  Are you having any financial strains with the device, your supplies or your medication? No .  Does the patient want to be seen by Chronic Care Management for management of their diabetes?  No  Would the patient like to be referred to a Nutritionist or for Diabetic Management?  No   Diabetic  Exams:  Diabetic Eye Exam: Completed 02/2022 Diabetic Foot Exam: Overdue, Pt has been advised about the importance in completing this exam. Pt is scheduled for diabetic foot exam on next office visit .   Interpreter Needed?: No  Information entered by :: Renie Ora, LPN   Activities of Daily Living    06/26/2022   12:11 PM  In your present state of health, do you have any difficulty performing the following activities:  Hearing? 0  Vision? 0  Difficulty concentrating or making decisions? 0  Walking or climbing stairs? 0  Dressing or bathing? 0  Doing errands, shopping? 0  Preparing Food and eating ? N  Using the Toilet? N  In the past six months, have you accidently leaked urine? N  Do you have problems with loss of bowel control? N  Managing your Medications? N  Managing your Finances? N  Housekeeping or managing your Housekeeping? N    Patient Care Team: Bennie Pierini, FNP as PCP - General (Nurse Practitioner) Antoine Poche, MD as PCP - Cardiology (Cardiology) Sharin Mons as Physician Assistant (Emergency Medicine) Zebedee Iba Ola Spurr, MD as Referring Physician (Urology) Doristine Church Jacelyn Pi, MD as Referring Physician (Gastroenterology)  Indicate any recent Medical Services you may have received from other than [facility] providers in the past year (date may be approximate).     Assessment:   This is a routine wellness examination for Kaishawn.  Hearing/Vision screen Vision Screening - Comments:: Wears rx glasses - up to date with routine eye exams with  Dr.Johnson   Dietary issues and exercise activities discussed: Current Exercise Habits: Home exercise routine, Type of exercise: walking, Time (Minutes): 30, Frequency (Times/Week): 3, Weekly Exercise (Minutes/Week): 90, Intensity: Mild, Exercise limited by: orthopedic condition(s)   Goals Addressed  This Visit's Progress    Have 3 meals a day   On track    06/22/2021 AWV Goal:  Improved Nutrition/Diet  Patient will verbalize understanding that diet plays an important role in overall health and that a poor diet is a risk factor for many chronic medical conditions.  Over the next year, patient will improve self management of their diet by incorporating better variety, improved meal pattern, more consistent meal timing, increased physical activity, better food choices, watch portion sizes/amount of food eaten at one time, and eat 6 small meals per day. Patient will utilize available community resources to help with food acquisition if needed (ex: food pantries, [address], etc) Patient will work with nutrition specialist if a referral was made        Depression Screen    06/26/2022   12:08 PM 05/15/2022    2:13 PM 02/13/2022    2:30 PM 11/11/2021    2:21 PM 08/11/2021    2:29 PM 06/22/2021   12:01 PM 05/10/2021    2:19 PM  PHQ 2/9 Scores  PHQ - 2 Score 0 1 1 0 1 2 2   PHQ- 9 Score 0 11 10 9 11 7 7     Fall Risk    06/26/2022   12:06 PM 05/15/2022    2:13 PM 02/13/2022    2:30 PM 11/11/2021    2:21 PM 08/11/2021    2:28 PM  Fall Risk   Falls in the past year? 1 0 1 0 1  Number falls in past yr: 1  1  0  Injury with Fall? 1  0  0  Risk for fall due to : History of fall(s);Impaired balance/gait;Orthopedic patient  History of fall(s)  History of fall(s)  Follow up Education provided;Falls prevention discussed  Education provided  Education provided    FALL RISK PREVENTION PERTAINING TO THE HOME:  Any stairs in or around the home? Yes  If so, are there any without handrails? No  Home free of loose throw rugs in walkways, pet beds, electrical cords, etc? Yes  Adequate lighting in your home to reduce risk of falls? Yes   ASSISTIVE DEVICES UTILIZED TO PREVENT FALLS:  Life alert? No  Use of a cane, walker or w/c? No  Grab bars in the bathroom? Yes  Shower chair or bench in shower? No  Elevated toilet seat or a handicapped toilet? No       04/10/2017    2:58 PM  MMSE -  Mini Mental State Exam  Orientation to time 5  Orientation to Place 5  Registration 3  Attention/ Calculation 5  Recall 1  Language- name 2 objects 2  Language- repeat 1  Language- follow 3 step command 3  Language- read & follow direction 1  Write a sentence 1  Copy design 1  Total score 28        06/26/2022   12:11 PM 06/08/2020    3:42 PM 05/08/2019    2:00 PM 05/07/2018   11:55 AM  6CIT Screen  What Year? 0 points 0 points 0 points 0 points  What month? 0 points 0 points 0 points 0 points  What time? 0 points 0 points 0 points 0 points  Count back from 20 0 points 0 points 0 points 0 points  Months in reverse 0 points 0 points 0 points 0 points  Repeat phrase 0 points 0 points 0 points 0 points  Total Score 0 points 0 points 0 points 0  points    Immunizations Immunization History  Administered Date(s) Administered   Fluad Quad(high Dose 65+) 10/11/2018, 11/06/2019, 10/21/2020, 11/02/2021   Influenza, High Dose Seasonal PF 10/20/2013, 10/11/2015, 10/11/2016, 11/09/2017   Influenza,inj,Quad PF,6+ Mos 10/14/2014   Pneumococcal Conjugate-13 03/02/2014   Pneumococcal Polysaccharide-23 11/10/2011   Td 03/02/2014   Zoster Recombinat (Shingrix) 05/10/2021, 08/11/2021    TDAP status: Up to date  Flu Vaccine status: Up to date  Pneumococcal vaccine status: Up to date  Covid-19 vaccine status: Declined, Education has been provided regarding the importance of this vaccine but patient still declined. Advised may receive this vaccine at local pharmacy or Health Dept.or vaccine clinic. Aware to provide a copy of the vaccination record if obtained from local pharmacy or Health Dept. Verbalized acceptance and understanding.  Qualifies for Shingles Vaccine? Yes   Zostavax completed No   Shingrix Completed?: No.    Education has been provided regarding the importance of this vaccine. Patient has been advised to call insurance company to determine out of pocket expense if they have  not yet received this vaccine. Advised may also receive vaccine at local pharmacy or Health Dept. Verbalized acceptance and understanding.  Screening Tests Health Maintenance  Topic Date Due   Colonoscopy  09/05/2020   OPHTHALMOLOGY EXAM  07/19/2022   INFLUENZA VACCINE  08/10/2022   Diabetic kidney evaluation - Urine ACR  08/12/2022   FOOT EXAM  11/12/2022   HEMOGLOBIN A1C  11/15/2022   Diabetic kidney evaluation - eGFR measurement  05/15/2023   Medicare Annual Wellness (AWV)  06/26/2023   DTaP/Tdap/Td (2 - Tdap) 03/02/2024   Pneumonia Vaccine 64+ Years old  Completed   Hepatitis C Screening  Completed   Zoster Vaccines- Shingrix  Completed   HPV VACCINES  Aged Out   COVID-19 Vaccine  Discontinued    Health Maintenance  Health Maintenance Due  Topic Date Due   Colonoscopy  09/05/2020    Colorectal cancer screening: No longer required.   Lung Cancer Screening: (Low Dose CT Chest recommended if Age 56-80 years, 30 pack-year currently smoking OR have quit w/in 15years.) does not qualify.   Lung Cancer Screening Referral: n/a  Additional Screening:  Hepatitis C Screening: does not qualify;   Vision Screening: Recommended annual ophthalmology exams for early detection of glaucoma and other disorders of the eye. Is the patient up to date with their annual eye exam?  Yes  Who is the provider or what is the name of the office in which the patient attends annual eye exams? Dr.johnson  If pt is not established with a provider, would they like to be referred to a provider to establish care? No .   Dental Screening: Recommended annual dental exams for proper oral hygiene  Community Resource Referral / Chronic Care Management: CRR required this visit?  No   CCM required this visit?  No      Plan:     I have personally reviewed and noted the following in the patient's chart:   Medical and social history Use of alcohol, tobacco or illicit drugs  Current medications and  supplements including opioid prescriptions. Patient is not currently taking opioid prescriptions. Functional ability and status Nutritional status Physical activity Advanced directives List of other physicians Hospitalizations, surgeries, and ER visits in previous 12 months Vitals Screenings to include cognitive, depression, and falls Referrals and appointments  In addition, I have reviewed and discussed with patient certain preventive protocols, quality metrics, and best practice recommendations. A written personalized care plan  for preventive services as well as general preventive health recommendations were provided to patient.     Lorrene Reid, LPN   [id number]/[id number]   Nurse Notes: none

## 2022-06-26 NOTE — Patient Instructions (Signed)
Rodney Hernandez , Thank you for taking time to come for your Medicare Wellness Visit. I appreciate your ongoing commitment to your health goals. Please review the following plan we discussed and let me know if I can assist you in the future.   These are the goals we discussed:  Goals      Exercise 3x per week (30 min per time)     Increase walking to 30-45 minutes at least 3 days per week.   06/22/2021 AWV Goal: Exercise for General Health  Patient will verbalize understanding of the benefits of increased physical activity: Exercising regularly is important. It will improve your overall fitness, flexibility, and endurance. Regular exercise also will improve your overall health. It can help you control your weight, reduce stress, and improve your bone density. Over the next year, patient will increase physical activity as tolerated with a goal of at least 150 minutes of moderate physical activity per week.  You can tell that you are exercising at a moderate intensity if your heart starts beating faster and you start breathing faster but can still hold a conversation. Moderate-intensity exercise ideas include: Walking 1 mile (1.6 km) in about 15 minutes Biking Hiking Golfing Dancing Water aerobics Patient will verbalize understanding of everyday activities that increase physical activity by providing examples like the following: Yard work, such as: Insurance underwriter Gardening Washing windows or floors Patient will be able to explain general safety guidelines for exercising:  Before you start a new exercise program, talk with your health care provider. Do not exercise so much that you hurt yourself, feel dizzy, or get very short of breath. Wear comfortable clothes and wear shoes with good support. Drink plenty of water while you exercise to prevent dehydration or heat stroke. Work out until your breathing  and your heartbeat get faster.      Have 3 meals a day     06/22/2021 AWV Goal: Improved Nutrition/Diet  Patient will verbalize understanding that diet plays an important role in overall health and that a poor diet is a risk factor for many chronic medical conditions.  Over the next year, patient will improve self management of their diet by incorporating better variety, improved meal pattern, more consistent meal timing, increased physical activity, better food choices, watch portion sizes/amount of food eaten at one time, and eat 6 small meals per day. Patient will utilize available community resources to help with food acquisition if needed (ex: food pantries, [address], etc) Patient will work with nutrition specialist if a referral was made         This is a list of the screening recommended for you and due dates:  Health Maintenance  Topic Date Due   Colon Cancer Screening  09/05/2020   Eye exam for diabetics  07/19/2022   Flu Shot  08/10/2022   Yearly kidney health urinalysis for diabetes  08/12/2022   Complete foot exam   11/12/2022   Hemoglobin A1C  11/15/2022   Yearly kidney function blood test for diabetes  05/15/2023   Medicare Annual Wellness Visit  06/26/2023   DTaP/Tdap/Td vaccine (2 - Tdap) 03/02/2024   Pneumonia Vaccine  Completed   Hepatitis C Screening  Completed   Zoster (Shingles) Vaccine  Completed   HPV Vaccine  Aged Out   COVID-19 Vaccine  Discontinued    Advanced directives: Advance directive discussed with you today. I have provided a copy for  you to complete at home and have notarized. Once this is complete please bring a copy in to our office so we can scan it into your chart.   Conditions/risks identified: Aim for 30 minutes of exercise or brisk walking, 6-8 glasses of water, and 5 servings of fruits and vegetables each day.   Next appointment: Follow up in one year for your annual wellness visit.   Preventive Care 47 Years and Older,  Male  Preventive care refers to lifestyle choices and visits with your health care provider that can promote health and wellness. What does preventive care include? A yearly physical exam. This is also called an annual well check. Dental exams once or twice a year. Routine eye exams. Ask your health care provider how often you should have your eyes checked. Personal lifestyle choices, including: Daily care of your teeth and gums. Regular physical activity. Eating a healthy diet. Avoiding tobacco and drug use. Limiting alcohol use. Practicing safe sex. Taking low doses of aspirin every day. Taking vitamin and mineral supplements as recommended by your health care provider. What happens during an annual well check? The services and screenings done by your health care provider during your annual well check will depend on your age, overall health, lifestyle risk factors, and family history of disease. Counseling  Your health care provider may ask you questions about your: Alcohol use. Tobacco use. Drug use. Emotional well-being. Home and relationship well-being. Sexual activity. Eating habits. History of falls. Memory and ability to understand (cognition). Work and work Astronomer. Screening  You may have the following tests or measurements: Height, weight, and BMI. Blood pressure. Lipid and cholesterol levels. These may be checked every 5 years, or more frequently if you are over 26 years old. Skin check. Lung cancer screening. You may have this screening every year starting at age 36 if you have a 30-pack-year history of smoking and currently smoke or have quit within the past 15 years. Fecal occult blood test (FOBT) of the stool. You may have this test every year starting at age 73. Flexible sigmoidoscopy or colonoscopy. You may have a sigmoidoscopy every 5 years or a colonoscopy every 10 years starting at age 61. Prostate cancer screening. Recommendations will vary depending  on your family history and other risks. Hepatitis C blood test. Hepatitis B blood test. Sexually transmitted disease (STD) testing. Diabetes screening. This is done by checking your blood sugar (glucose) after you have not eaten for a while (fasting). You may have this done every 1-3 years. Abdominal aortic aneurysm (AAA) screening. You may need this if you are a current or former smoker. Osteoporosis. You may be screened starting at age 8 if you are at high risk. Talk with your health care provider about your test results, treatment options, and if necessary, the need for more tests. Vaccines  Your health care provider may recommend certain vaccines, such as: Influenza vaccine. This is recommended every year. Tetanus, diphtheria, and acellular pertussis (Tdap, Td) vaccine. You may need a Td booster every 10 years. Zoster vaccine. You may need this after age 53. Pneumococcal 13-valent conjugate (PCV13) vaccine. One dose is recommended after age 79. Pneumococcal polysaccharide (PPSV23) vaccine. One dose is recommended after age 33. Talk to your health care provider about which screenings and vaccines you need and how often you need them. This information is not intended to replace advice given to you by your health care provider. Make sure you discuss any questions you have with your health care  provider. Document Released: 01/22/2015 Document Revised: 09/15/2015 Document Reviewed: 10/27/2014 Elsevier Interactive Patient Education  2017 [employer].  Fall Prevention in the Home Falls can cause injuries. They can happen to people of all ages. There are many things you can do to make your home safe and to help prevent falls. What can I do on the outside of my home? Regularly fix the edges of walkways and driveways and fix any cracks. Remove anything that might make you trip as you walk through a door, such as a raised step or threshold. Trim any bushes or trees on the path to your home. Use  bright outdoor lighting. Clear any walking paths of anything that might make someone trip, such as rocks or tools. Regularly check to see if handrails are loose or broken. Make sure that both sides of any steps have handrails. Any raised decks and porches should have guardrails on the edges. Have any leaves, snow, or ice cleared regularly. Use sand or salt on walking paths during winter. Clean up any spills in your garage right away. This includes oil or grease spills. What can I do in the bathroom? Use night lights. Install grab bars by the toilet and in the tub and shower. Do not use towel bars as grab bars. Use non-skid mats or decals in the tub or shower. If you need to sit down in the shower, use a plastic, non-slip stool. Keep the floor dry. Clean up any water that spills on the floor as soon as it happens. Remove soap buildup in the tub or shower regularly. Attach bath mats securely with double-sided non-slip rug tape. Do not have throw rugs and other things on the floor that can make you trip. What can I do in the bedroom? Use night lights. Make sure that you have a light by your bed that is easy to reach. Do not use any sheets or blankets that are too big for your bed. They should not hang down onto the floor. Have a firm chair that has side arms. You can use this for support while you get dressed. Do not have throw rugs and other things on the floor that can make you trip. What can I do in the kitchen? Clean up any spills right away. Avoid walking on wet floors. Keep items that you use a lot in easy-to-reach places. If you need to reach something above you, use a strong step stool that has a grab bar. Keep electrical cords out of the way. Do not use floor polish or wax that makes floors slippery. If you must use wax, use non-skid floor wax. Do not have throw rugs and other things on the floor that can make you trip. What can I do with my stairs? Do not leave any items on the  stairs. Make sure that there are handrails on both sides of the stairs and use them. Fix handrails that are broken or loose. Make sure that handrails are as long as the stairways. Check any carpeting to make sure that it is firmly attached to the stairs. Fix any carpet that is loose or worn. Avoid having throw rugs at the top or bottom of the stairs. If you do have throw rugs, attach them to the floor with carpet tape. Make sure that you have a light switch at the top of the stairs and the bottom of the stairs. If you do not have them, ask someone to add them for you. What else can I do  to help prevent falls? Wear shoes that: Do not have high heels. Have rubber bottoms. Are comfortable and fit you well. Are closed at the toe. Do not wear sandals. If you use a stepladder: Make sure that it is fully opened. Do not climb a closed stepladder. Make sure that both sides of the stepladder are locked into place. Ask someone to hold it for you, if possible. Clearly mark and make sure that you can see: Any grab bars or handrails. First and last steps. Where the edge of each step is. Use tools that help you move around (mobility aids) if they are needed. These include: Canes. Walkers. Scooters. Crutches. Turn on the lights when you go into a dark area. Replace any light bulbs as soon as they burn out. Set up your furniture so you have a clear path. Avoid moving your furniture around. If any of your floors are uneven, fix them. If there are any pets around you, be aware of where they are. Review your medicines with your doctor. Some medicines can make you feel dizzy. This can increase your chance of falling. Ask your doctor what other things that you can do to help prevent falls. This information is not intended to replace advice given to you by your health care provider. Make sure you discuss any questions you have with your health care provider. Document Released: 10/22/2008 Document Revised:  06/03/2015 Document Reviewed: 01/30/2014 Elsevier Interactive Patient Education  2017 [employer].

## 2022-07-03 ENCOUNTER — Encounter: Payer: Self-pay | Admitting: *Deleted

## 2022-08-21 ENCOUNTER — Ambulatory Visit: Payer: Medicare PPO | Admitting: Nurse Practitioner

## 2022-08-29 ENCOUNTER — Encounter: Payer: Self-pay | Admitting: Nurse Practitioner

## 2022-08-29 ENCOUNTER — Ambulatory Visit (INDEPENDENT_AMBULATORY_CARE_PROVIDER_SITE_OTHER): Payer: Medicare PPO | Admitting: Nurse Practitioner

## 2022-08-29 VITALS — BP 150/72 | HR 75 | Temp 97.9°F | Resp 20 | Ht 67.0 in | Wt 191.0 lb

## 2022-08-29 DIAGNOSIS — E785 Hyperlipidemia, unspecified: Secondary | ICD-10-CM | POA: Diagnosis not present

## 2022-08-29 DIAGNOSIS — R600 Localized edema: Secondary | ICD-10-CM | POA: Diagnosis not present

## 2022-08-29 DIAGNOSIS — F321 Major depressive disorder, single episode, moderate: Secondary | ICD-10-CM

## 2022-08-29 DIAGNOSIS — I700 Atherosclerosis of aorta: Secondary | ICD-10-CM

## 2022-08-29 DIAGNOSIS — Z7984 Long term (current) use of oral hypoglycemic drugs: Secondary | ICD-10-CM

## 2022-08-29 DIAGNOSIS — L4050 Arthropathic psoriasis, unspecified: Secondary | ICD-10-CM

## 2022-08-29 DIAGNOSIS — E1169 Type 2 diabetes mellitus with other specified complication: Secondary | ICD-10-CM

## 2022-08-29 DIAGNOSIS — E119 Type 2 diabetes mellitus without complications: Secondary | ICD-10-CM | POA: Diagnosis not present

## 2022-08-29 DIAGNOSIS — I6930 Unspecified sequelae of cerebral infarction: Secondary | ICD-10-CM | POA: Diagnosis not present

## 2022-08-29 DIAGNOSIS — I63239 Cerebral infarction due to unspecified occlusion or stenosis of unspecified carotid arteries: Secondary | ICD-10-CM

## 2022-08-29 DIAGNOSIS — D509 Iron deficiency anemia, unspecified: Secondary | ICD-10-CM

## 2022-08-29 DIAGNOSIS — N401 Enlarged prostate with lower urinary tract symptoms: Secondary | ICD-10-CM

## 2022-08-29 DIAGNOSIS — J4520 Mild intermittent asthma, uncomplicated: Secondary | ICD-10-CM

## 2022-08-29 DIAGNOSIS — I10 Essential (primary) hypertension: Secondary | ICD-10-CM | POA: Diagnosis not present

## 2022-08-29 DIAGNOSIS — K219 Gastro-esophageal reflux disease without esophagitis: Secondary | ICD-10-CM

## 2022-08-29 LAB — BAYER DCA HB A1C WAIVED: HB A1C (BAYER DCA - WAIVED): 8.5 % — ABNORMAL HIGH (ref 4.8–5.6)

## 2022-08-29 MED ORDER — OLMESARTAN MEDOXOMIL 40 MG PO TABS
40.0000 mg | ORAL_TABLET | Freq: Every day | ORAL | 1 refills | Status: DC
Start: 2022-08-29 — End: 2023-02-27

## 2022-08-29 MED ORDER — METFORMIN HCL 500 MG PO TABS: 500.0000 mg | ORAL_TABLET | Freq: Two times a day (BID) | ORAL | 3 refills | Status: DC

## 2022-08-29 MED ORDER — POLYSACCHARIDE IRON COMPLEX 150 MG PO CAPS
ORAL_CAPSULE | ORAL | 1 refills | Status: DC
Start: 2022-08-29 — End: 2023-02-27

## 2022-08-29 MED ORDER — METOPROLOL SUCCINATE ER 25 MG PO TB24
12.5000 mg | ORAL_TABLET | Freq: Every day | ORAL | 1 refills | Status: DC
Start: 2022-08-29 — End: 2023-02-27

## 2022-08-29 MED ORDER — ESCITALOPRAM OXALATE 20 MG PO TABS
20.0000 mg | ORAL_TABLET | Freq: Every day | ORAL | 1 refills | Status: DC
Start: 2022-08-29 — End: 2023-02-27

## 2022-08-29 MED ORDER — TRAMADOL HCL 50 MG PO TABS
50.0000 mg | ORAL_TABLET | Freq: Two times a day (BID) | ORAL | 2 refills | Status: DC
Start: 2022-08-29 — End: 2023-02-27

## 2022-08-29 MED ORDER — PANTOPRAZOLE SODIUM 40 MG PO TBEC
40.0000 mg | DELAYED_RELEASE_TABLET | Freq: Two times a day (BID) | ORAL | 1 refills | Status: DC
Start: 2022-08-29 — End: 2023-02-27

## 2022-08-29 MED ORDER — CLOPIDOGREL BISULFATE 75 MG PO TABS
75.0000 mg | ORAL_TABLET | Freq: Every day | ORAL | 1 refills | Status: DC
Start: 2022-08-29 — End: 2023-02-27

## 2022-08-29 MED ORDER — AMLODIPINE BESYLATE 5 MG PO TABS: 5.0000 mg | ORAL_TABLET | Freq: Every day | ORAL | 1 refills | Status: DC

## 2022-08-29 MED ORDER — FLUTICASONE FUROATE-VILANTEROL 100-25 MCG/ACT IN AEPB
1.0000 | INHALATION_SPRAY | Freq: Every day | RESPIRATORY_TRACT | 11 refills | Status: DC
Start: 2022-08-29 — End: 2022-11-23

## 2022-08-29 MED ORDER — ATORVASTATIN CALCIUM 40 MG PO TABS
40.0000 mg | ORAL_TABLET | Freq: Every day | ORAL | 1 refills | Status: DC
Start: 2022-08-29 — End: 2023-02-27

## 2022-08-29 NOTE — Progress Notes (Signed)
Subjective:    Patient ID: Rodney Hernandez, male    DOB: [birth year], 77 y.o.   MRN: [id number]   Chief Complaint: medical management of chronic issues     HPI:  Rodney Hernandez is a 77 y.o. who identifies as a male who was assigned male at birth.   Social history: Lives with: by himself Work history: retired   Water engineer in today for follow up of the following chronic medical issues:  1. Essential hypertension No c/o chest  pain sob or headache. Does not check blood pressure at home. BP Readings from Last 3 Encounters:  05/15/22 (!) 150/68  04/14/22 (!) 112/56  02/13/22 125/63     2. Aortic atherosclerosis (HCC)  3. Carotid stenosis, symptomatic, with infarction (HCC) Has not had a recent U/s that is can see on chart. But patient says he had done in October and had around 40% blockage.  4. Gastroesophageal reflux disease without esophagitis Is on protonix and is doing well.  5. Hyperlipidemia associated with type 2 diabetes mellitus (HCC) Does not watch diet and does no exercise. Lab Results  Component Value Date   CHOL 104 05/15/2022   HDL 42 05/15/2022   LDLCALC 37 05/15/2022   TRIG 148 05/15/2022   CHOLHDL 2.5 05/15/2022     6. Diabetes mellitus treated with oral medication [facility]) He doe snot check his blood sugars at home. Lab Results  Component Value Date   HGBA1C 7.2 (H) 05/15/2022     7. Benign prostatic hyperplasia with lower urinary tract symptoms, symptom details unspecified No voiding issues. Saw urologist on 06/21/22. No changes made to plan of care and he is to follow up in 6 months  8. Iron deficiency anemia, unspecified iron deficiency anemia type Says he does feel fatigued. Just does not feel like doing anything. Takes iron supplement daily. Lab Results  Component Value Date   HGB 12.4 (L) 05/15/2022     9. Depression, major, single episode, moderate (HCC) Is on lexapro daily and says he is doing OK.    08/29/2022    2:45 PM 06/26/2022   12:08  PM 05/15/2022    2:13 PM  Depression screen PHQ 2/9  Decreased Interest 0 0 1  Down, Depressed, Hopeless 0 0 0  PHQ - 2 Score 0 0 1  Altered sleeping 2 0 3  Tired, decreased energy 1 0 2  Change in appetite 1 0 2  Feeling bad or failure about yourself  0 0 0  Trouble concentrating 3 0 3  Moving slowly or fidgety/restless 0 0 0  Suicidal thoughts 0 0 0  PHQ-9 Score 7 0 11  Difficult doing work/chores Not difficult at all Not difficult at all Somewhat difficult     10. Peripheral edema Has some edema by the end of the day  11. Late effect of cerebrovascular accident (CVA) No permanent effects.   New complaints: None today  Allergies  Allergen Reactions   Nsaids Shortness Of Breath   Aspirin Other (See Comments)    Affects patient breathing   Morphine And Codeine Hives and Itching   Sulfa Antibiotics    Outpatient Encounter Medications as of 08/29/2022  Medication Sig   Accu-Chek Softclix Lancets lancets Test BS up to QID Dx E11.9   albuterol (VENTOLIN HFA) 108 (90 Base) MCG/ACT inhaler TAKE 2 PUFFS BY MOUTH EVERY 6 HOURS AS NEEDED FOR WHEEZE OR SHORTNESS OF BREATH   amLODipine (NORVASC) 5 MG tablet Take 1 tablet (5 mg total) by  mouth daily.   aspirin 81 MG chewable tablet Chew 81 mg by mouth daily.   atorvastatin (LIPITOR) 40 MG tablet Take 1 tablet (40 mg total) by mouth daily.   blood glucose meter kit and supplies KIT Dispense based on patient and insurance preference. Use up to four times daily as directed. (FOR ICD-9 250.00, 250.01).   cholecalciferol (VITAMIN D3) 25 MCG (1000 UNIT) tablet Take 1 tablet (1,000 Units total) by mouth daily.   clopidogrel (PLAVIX) 75 MG tablet Take 1 tablet (75 mg total) by mouth daily.   escitalopram (LEXAPRO) 20 MG tablet Take 1 tablet (20 mg total) by mouth daily.   fluticasone furoate-vilanterol (BREO ELLIPTA) 100-25 MCG/ACT AEPB Inhale 1 puff into the lungs daily.   glucose blood (ACCU-CHEK GUIDE) test strip Test BS up to QID Dx  E11.9   iron polysaccharides (NIFEREX) 150 MG capsule TAKE 1 CAPSULE BY MOUTH EVERY DAY   metFORMIN (GLUCOPHAGE) 500 MG tablet Take 1 tablet (500 mg total) by mouth 2 (two) times daily with a meal.   metoprolol succinate (TOPROL-XL) 25 MG 24 hr tablet Take 12.5 mg by mouth daily.   olmesartan (BENICAR) 40 MG tablet Take 1 tablet (40 mg total) by mouth daily.   pantoprazole (PROTONIX) 40 MG tablet Take 1 tablet (40 mg total) by mouth 2 (two) times daily.   sucralfate (CARAFATE) 1 g tablet TAKE ONE TABLET FOUR TIMES DAILY BEFORE MEALS AND AT BEDTIME   traMADol (ULTRAM) 50 MG tablet Take 1 tablet (50 mg total) by mouth 2 (two) times daily.   No facility-administered encounter medications on file as of 08/29/2022.    Past Surgical History:  Procedure Laterality Date   collapsed lung     HERNIA REPAIR     PROSTATE SURGERY      Family History  Problem Relation Age of Onset   Lung cancer Father    Prostate cancer Father    Stomach cancer Maternal Grandmother    Prostate cancer Paternal Uncle    Cervical cancer Daughter    Psoriasis Daughter    Arthritis Daughter        psoriatic arthritis      Controlled substance contract: n/a     Review of Systems  Constitutional:  Negative for diaphoresis.  Eyes:  Negative for pain.  Respiratory:  Negative for shortness of breath.   Cardiovascular:  Negative for chest pain, palpitations and leg swelling.  Gastrointestinal:  Negative for abdominal pain.  Endocrine: Negative for polydipsia.  Skin:  Negative for rash.  Neurological:  Negative for dizziness, weakness and headaches.  Hematological:  Does not bruise/bleed easily.  All other systems reviewed and are negative.      Objective:   Physical Exam Vitals and nursing note reviewed.  Constitutional:      Appearance: Normal appearance. He is well-developed.  HENT:     Head: Normocephalic.     Nose: Nose normal.     Mouth/Throat:     Mouth: Mucous membranes are moist.      Pharynx: Oropharynx is clear.  Eyes:     Pupils: Pupils are equal, round, and reactive to light.  Neck:     Thyroid: No thyroid mass or thyromegaly.     Vascular: No carotid bruit or JVD.     Trachea: Phonation normal.  Cardiovascular:     Rate and Rhythm: Normal rate and regular rhythm.  Pulmonary:     Effort: Pulmonary effort is normal. No respiratory distress.     Breath  sounds: Normal breath sounds.  Abdominal:     General: Bowel sounds are normal.     Palpations: Abdomen is soft.     Tenderness: There is no abdominal tenderness.  Musculoskeletal:        General: Normal range of motion.     Cervical back: Normal range of motion and neck supple.  Lymphadenopathy:     Cervical: No cervical adenopathy.  Skin:    General: Skin is warm and dry.  Neurological:     Mental Status: He is alert and oriented to person, place, and time.  Psychiatric:        Behavior: Behavior normal.        Thought Content: Thought content normal.        Judgment: Judgment normal.     BP (!) 150/72   Pulse 75   Temp 97.9 F (36.6 C) (Temporal)   Resp 20   Ht 5\' 7"  (1.702 m)   Wt 191 lb (86.6 kg)   SpO2 91%   BMI 29.91 kg/m   HGBA1c 8.5%     Assessment & Plan:   Rodney Hernandez comes in today with chief complaint of Medical Management of Chronic Issues   Diagnosis and orders addressed:  1. Essential hypertension Low sodium diet - CBC with Differential/Platelet - CMP14+EGFR - amLODipine (NORVASC) 5 MG tablet; Take 1 tablet (5 mg total) by mouth daily.  Dispense: 90 tablet; Refill: 1 - olmesartan (BENICAR) 40 MG tablet; Take 1 tablet (40 mg total) by mouth daily.  Dispense: 90 tablet; Refill: 1  2. Aortic atherosclerosis (HCC) - metoprolol succinate (TOPROL-XL) 25 MG 24 hr tablet; Take 0.5 tablets (12.5 mg total) by mouth daily.  Dispense: 90 tablet; Refill: 1  3. Carotid stenosis, symptomatic, with infarction (HCC) Year U/s  4. Gastroesophageal reflux disease without  esophagitis Avoid spicy foods Do not eat 2 hours prior to bedtime  - pantoprazole (PROTONIX) 40 MG tablet; Take 1 tablet (40 mg total) by mouth 2 (two) times daily.  Dispense: 180 tablet; Refill: 1  5. Hyperlipidemia associated with type 2 diabetes mellitus (HCC) Low fat diet - Lipid panel - atorvastatin (LIPITOR) 40 MG tablet; Take 1 tablet (40 mg total) by mouth daily.  Dispense: 90 tablet; Refill: 1   6. Diabetes mellitus treated with oral medication (HCC) Stricter carb counting - Bayer DCA Hb A1c Waived - Microalbumin / creatinine urine ratio - metFORMIN (GLUCOPHAGE) 500 MG tablet; Take 1 tablet (500 mg total) by mouth 2 (two) times daily with a meal.  Dispense: 180 tablet; Refill: 3  7. Benign prostatic hyperplasia with lower urinary tract symptoms, symptom details unspecified Report any voiding issues  8. Iron deficiency anemia, unspecified iron deficiency anemia type Labs pending - iron polysaccharides (NIFEREX) 150 MG capsule; TAKE 1 CAPSULE BY MOUTH EVERY DAY  Dispense: 90 capsule; Refill: 1  9. Depression, major, single episode, moderate (HCC) Stress management - escitalopram (LEXAPRO) 20 MG tablet; Take 1 tablet (20 mg total) by mouth daily.  Dispense: 90 tablet; Refill: 1  10. Peripheral edema Elevate legs when sitting  11. Late effect of cerebrovascular accident (CVA) - clopidogrel (PLAVIX) 75 MG tablet; Take 1 tablet (75 mg total) by mouth daily.  Dispense: 90 tablet; Refill: 1   12. Mild intermittent chronic asthma without complication - fluticasone furoate-vilanterol (BREO ELLIPTA) 100-25 MCG/ACT AEPB; Inhale 1 puff into the lungs daily.  Dispense: 1 each; Refill: 11  13. Psoriatic arthritis (HCC) - traMADol (ULTRAM) 50 MG tablet; Take 1 tablet (  50 mg total) by mouth 2 (two) times daily.  Dispense: 60 tablet; Refill: 2   Labs pending Health Maintenance reviewed Diet and exercise encouraged  Follow up plan: 3 months   Mary-Margaret Daphine Deutscher, FNP

## 2022-08-29 NOTE — Patient Instructions (Signed)
Peripheral Edema  Peripheral edema is swelling that is caused by a buildup of fluid. Peripheral edema most often affects the lower legs, ankles, and feet. It can also develop in the arms, hands, and face. The area of the body that has peripheral edema will look swollen. It may also feel heavy or warm. Your clothes may start to feel tight. Pressing on the area may make a temporary dent in your skin (pitting edema). You may not be able to move your swollen arm or leg as much as usual. There are many causes of peripheral edema. It can happen because of a complication of other conditions such as heart failure, kidney disease, or a problem with your circulation. It also can be a side effect of certain medicines or happen because of an infection. It often happens to women during pregnancy. Sometimes, the cause is not known. Follow these instructions at home: Managing pain, stiffness, and swelling  Raise (elevate) your legs while you are sitting or lying down. Move around often to prevent stiffness and to reduce swelling. Do not sit or stand for long periods of time. Do not wear tight clothing. Do not wear garters on your upper legs. Exercise your legs to get your circulation going. This helps to move the fluid back into your blood vessels, and it may help the swelling go down. Wear compression stockings as told by your health care provider. These stockings help to prevent blood clots and reduce swelling in your legs. It is important that these are the correct size. These stockings should be prescribed by your doctor to prevent possible injuries. If elastic bandages or wraps are recommended, use them as told by your health care provider. Medicines Take over-the-counter and prescription medicines only as told by your health care provider. Your health care provider may prescribe medicine to help your body get rid of excess water (diuretic). Take this medicine if you are told to take it. General  instructions Eat a low-salt (low-sodium) diet as told by your health care provider. Sometimes, eating less salt may reduce swelling. Pay attention to any changes in your symptoms. Moisturize your skin daily to help prevent skin from cracking and draining. Keep all follow-up visits. This is important. Contact a health care provider if: You have a fever. You have swelling in only one leg. You have increased swelling, redness, or pain in one or both of your legs. You have drainage or sores at the area where you have edema. Get help right away if: You have edema that starts suddenly or is getting worse, especially if you are pregnant or have a medical condition. You develop shortness of breath, especially when you are lying down. You have pain in your chest or abdomen. You feel weak. You feel like you will faint. These symptoms may be an emergency. Get help right away. Call 911. Do not wait to see if the symptoms will go away. Do not drive yourself to the hospital. Summary Peripheral edema is swelling that is caused by a buildup of fluid. Peripheral edema most often affects the lower legs, ankles, and feet. Move around often to prevent stiffness and to reduce swelling. Do not sit or stand for long periods of time. Pay attention to any changes in your symptoms. Contact a health care provider if you have edema that starts suddenly or is getting worse, especially if you are pregnant or have a medical condition. Get help right away if you develop shortness of breath, especially when lying down.   This information is not intended to replace advice given to you by your health care provider. Make sure you discuss any questions you have with your health care provider. Document Revised: 08/30/2020 Document Reviewed: 08/30/2020 Elsevier Patient Education  2024 [employer]  

## 2022-08-30 LAB — LIPID PANEL
Chol/HDL Ratio: 2.9 ratio (ref 0.0–5.0)
Cholesterol, Total: 123 mg/dL (ref 100–199)
HDL: 43 mg/dL (ref >39)
LDL Chol Calc (NIH): 50 mg/dL (ref 0–99)
Triglycerides: 181 mg/dL — ABNORMAL HIGH (ref 0–149)
VLDL Cholesterol Cal: 30 mg/dL (ref 5–40)

## 2022-08-30 LAB — CMP14+EGFR
ALT: 16 IU/L (ref 0–44)
AST: 12 IU/L (ref 0–40)
Albumin: 4.3 g/dL (ref 3.8–4.8)
Alkaline Phosphatase: 121 IU/L (ref 44–121)
BUN/Creatinine Ratio: 18 (ref 10–24)
BUN: 17 mg/dL (ref 8–27)
Bilirubin Total: 0.3 mg/dL (ref 0.0–1.2)
CO2: 28 mmol/L (ref 20–29)
Calcium: 11.5 mg/dL — ABNORMAL HIGH (ref 8.6–10.2)
Chloride: 100 mmol/L (ref 96–106)
Creatinine, Ser: 0.95 mg/dL (ref 0.76–1.27)
Globulin, Total: 2.2 g/dL (ref 1.5–4.5)
Glucose: 122 mg/dL — ABNORMAL HIGH (ref 70–99)
Potassium: 5 mmol/L (ref 3.5–5.2)
Sodium: 139 mmol/L (ref 134–144)
Total Protein: 6.5 g/dL (ref 6.0–8.5)
eGFR: 81 mL/min/{1.73_m2} (ref >59)

## 2022-08-30 LAB — CBC WITH DIFFERENTIAL/PLATELET
Basophils Absolute: 0.1 10*3/uL (ref 0.0–0.2)
Basos: 1 %
EOS (ABSOLUTE): 0.5 10*3/uL — ABNORMAL HIGH (ref 0.0–0.4)
Eos: 5 %
Hematocrit: 39.1 % (ref 37.5–51.0)
Hemoglobin: 12.8 g/dL — ABNORMAL LOW (ref 13.0–17.7)
Immature Grans (Abs): 0.1 10*3/uL (ref 0.0–0.1)
Immature Granulocytes: 1 %
Lymphocytes Absolute: 1.7 10*3/uL (ref 0.7–3.1)
Lymphs: 17 %
MCH: 27.8 pg (ref 26.6–33.0)
MCHC: 32.7 g/dL (ref 31.5–35.7)
MCV: 85 fL (ref 79–97)
Monocytes Absolute: 1.1 10*3/uL — ABNORMAL HIGH (ref 0.1–0.9)
Monocytes: 11 %
Neutrophils Absolute: 6.9 10*3/uL (ref 1.4–7.0)
Neutrophils: 65 %
Platelets: 291 10*3/uL (ref 150–450)
RBC: 4.6 x10E6/uL (ref 4.14–5.80)
RDW: 13.2 % (ref 11.6–15.4)
WBC: 10.3 10*3/uL (ref 3.4–10.8)

## 2022-08-30 LAB — HM DIABETES EYE EXAM

## 2022-09-05 DIAGNOSIS — D485 Neoplasm of uncertain behavior of skin: Secondary | ICD-10-CM | POA: Diagnosis not present

## 2022-09-05 DIAGNOSIS — Z85828 Personal history of other malignant neoplasm of skin: Secondary | ICD-10-CM | POA: Diagnosis not present

## 2022-09-05 DIAGNOSIS — L570 Actinic keratosis: Secondary | ICD-10-CM | POA: Diagnosis not present

## 2022-10-17 ENCOUNTER — Ambulatory Visit: Payer: Medicare PPO | Attending: Cardiology | Admitting: Cardiology

## 2022-10-17 ENCOUNTER — Encounter: Payer: Self-pay | Admitting: Cardiology

## 2022-10-17 VITALS — BP 138/78 | HR 55 | Ht 67.5 in | Wt 193.4 lb

## 2022-10-17 DIAGNOSIS — I10 Essential (primary) hypertension: Secondary | ICD-10-CM | POA: Diagnosis not present

## 2022-10-17 DIAGNOSIS — R0602 Shortness of breath: Secondary | ICD-10-CM | POA: Diagnosis not present

## 2022-10-17 DIAGNOSIS — E782 Mixed hyperlipidemia: Secondary | ICD-10-CM | POA: Diagnosis not present

## 2022-10-17 DIAGNOSIS — I493 Ventricular premature depolarization: Secondary | ICD-10-CM

## 2022-10-17 DIAGNOSIS — Z79899 Other long term (current) drug therapy: Secondary | ICD-10-CM

## 2022-10-17 DIAGNOSIS — J449 Chronic obstructive pulmonary disease, unspecified: Secondary | ICD-10-CM | POA: Diagnosis not present

## 2022-10-17 MED ORDER — SPIRONOLACTONE 25 MG PO TABS: 25.0000 mg | ORAL_TABLET | Freq: Every day | ORAL | 6 refills | Status: DC

## 2022-10-17 NOTE — Progress Notes (Signed)
Clinical Summary Mr. Huseby is a 77 y.o.male seen today for follow up of the following medical problems.    1. SOB -previously reported some DOE with activity, example playing golf he gets significantly SOB. Used to tolerate quite well. Exertion is also limited by chronic leg pains as well.  - denies any chest pain. No LE edema. No orthopnea. No palpitations   CAD risk factors: HTN, Hyperlipidemia, former smoker x 20. Multiple family members on father's side with heart conditions.      08/2015 echo with LVEF 55-60%, normal diastolic function, mild MR, mild RV enlargement, PASP 42   11/2018 PFTs moderate obstruction 11/2018 echo LVEF 60-65%, no WMAs, normal RV,  11/2018 nuclear stress: no ischemia  - using his breo, will takes albuterol prn. Still some SOB at times.       2. HTN - compliant with meds - low bps on hydrochlorothiazide, has been discontinued.  - started on norvasc 2 years ago, increased dose last year to 5mg  - some swelling in legs over last year, unclear if related to norvasc.  - home bp's 130s/60-70s   3. COPD -2020 PFTs moderate obstruction     4. Hyperlipidemia -08/09/20 TC 97 TG 57 HDL 43 LDL 41 - 08/2022 TC 696 TG 295 HDL 43 LDL 50     5. 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 NIHSS 7.  - CT @ OSH negative for hemorrhage. CTA head and neck brain with L MCA occlusion. - Pt candidate for IV tPA, so it was given and pt transferred to [facility] for thrombectomy. - Thrombectomy by Dr. Lanna Poche - L M2 embolus, TICI 3 flow post thrombectomy  - MRI 1/5 with scattered acute infarctions in the L cerebral hemisphere, tiny petechial hemorrhage in the L insula and putamen infarctions, mild leukoaraiosis, and tiny chronic R cerebellar infarction.  - 24hr CT post tPA (1/5) - negative -Patient currently on aspirin, plavix, lipitor  2) Carotid  Stenosis of L ICA  - 70% ICA stenosis  - s/p left CEA on 1/7 -Currently on ASA/Plavix           6.PVCs - no recent symptoms  7. LE edema - started about 1 year ago, L>R - norvasc increased to 5mg  last year, unclear if related.   Past Medical History:  Diagnosis Date   Arthritis    psoriatic arthritis and osteo arthritis   Asthma    Cancer (HCC)    Prostate   Carotid artery stenosis    a.s/p L CEA in 01/2020   Collapse of lung    Colon polyps    DOE (dyspnea on exertion) 12/02/2018   Eczema    GERD (gastroesophageal reflux disease)    Hypertension    Seasonal allergies    Stroke (HCC)    Vertigo      Allergies  Allergen Reactions   Nsaids Shortness Of Breath   Aspirin Other (See Comments)    Affects patient breathing   Morphine And Codeine Hives and Itching   Sulfa Antibiotics      Current Outpatient Medications  Medication Sig Dispense Refill   albuterol (VENTOLIN HFA) 108 (90 Base) MCG/ACT inhaler TAKE 2 PUFFS BY MOUTH EVERY 6 HOURS AS NEEDED FOR WHEEZE OR SHORTNESS OF BREATH 6.7 g 1   amLODipine (NORVASC) 5 MG tablet Take 1  tablet (5 mg total) by mouth daily. 90 tablet 1   aspirin 81 MG chewable tablet Chew 81 mg by mouth daily.     atorvastatin (LIPITOR) 40 MG tablet Take 1 tablet (40 mg total) by mouth daily. 90 tablet 1   cholecalciferol (VITAMIN D3) 25 MCG (1000 UNIT) tablet Take 1 tablet (1,000 Units total) by mouth daily. 90 tablet 3   clopidogrel (PLAVIX) 75 MG tablet Take 1 tablet (75 mg total) by mouth daily. 90 tablet 1   escitalopram (LEXAPRO) 20 MG tablet Take 1 tablet (20 mg total) by mouth daily. 90 tablet 1   fluticasone furoate-vilanterol (BREO ELLIPTA) 100-25 MCG/ACT AEPB Inhale 1 puff into the lungs daily. 1 each 11   iron polysaccharides (NIFEREX) 150 MG capsule TAKE 1 CAPSULE BY MOUTH EVERY DAY 90 capsule 1   Leuprolide Acetate, 6 Month, (LUPRON) 45 MG injection Inject 45 mg into the muscle every 6 (six) months.     metFORMIN  (GLUCOPHAGE) 500 MG tablet Take 1 tablet (500 mg total) by mouth 2 (two) times daily with a meal. 180 tablet 3   metoprolol succinate (TOPROL-XL) 25 MG 24 hr tablet Take 0.5 tablets (12.5 mg total) by mouth daily. 90 tablet 1   olmesartan (BENICAR) 40 MG tablet Take 1 tablet (40 mg total) by mouth daily. 90 tablet 1   pantoprazole (PROTONIX) 40 MG tablet Take 1 tablet (40 mg total) by mouth 2 (two) times daily. 180 tablet 1   sucralfate (CARAFATE) 1 g tablet Take 1 g by mouth daily as needed.     traMADol (ULTRAM) 50 MG tablet Take 1 tablet (50 mg total) by mouth 2 (two) times daily. 60 tablet 2   Accu-Chek Softclix Lancets lancets Test BS up to QID Dx E11.9 (Patient not taking: Reported on 10/17/2022) 100 each 12   blood glucose meter kit and supplies KIT Dispense based on patient and insurance preference. Use up to four times daily as directed. (FOR ICD-9 250.00, 250.01). (Patient not taking: Reported on 10/17/2022) 1 each 0   glucose blood (ACCU-CHEK GUIDE) test strip Test BS up to QID Dx E11.9 (Patient not taking: Reported on 10/17/2022) 100 each 12   No current facility-administered medications for this visit.     Past Surgical History:  Procedure Laterality Date   collapsed lung     HERNIA REPAIR     PROSTATE SURGERY       Allergies  Allergen Reactions   Nsaids Shortness Of Breath   Aspirin Other (See Comments)    Affects patient breathing   Morphine And Codeine Hives and Itching   Sulfa Antibiotics       Family History  Problem Relation Age of Onset   Lung cancer Father    Prostate cancer Father    Stomach cancer Maternal Grandmother    Prostate cancer Paternal Uncle    Cervical cancer Daughter    Psoriasis Daughter    Arthritis Daughter        psoriatic arthritis     Social History Mr. Buckbee reports that he quit smoking about 41 years ago. His smoking use included cigarettes. He started smoking about 63 years ago. He has a 44 pack-year smoking history. He has  never used smokeless tobacco. Mr. Munford reports no history of alcohol use.   Review of Systems CONSTITUTIONAL: No weight loss, fever, chills, weakness or fatigue.  HEENT: Eyes: No visual loss, blurred vision, double vision or yellow sclerae.No hearing loss, sneezing, congestion, runny nose or sore  throat.  SKIN: No rash or itching.  CARDIOVASCULAR: per hpi RESPIRATORY: No shortness of breath, cough or sputum.  GASTROINTESTINAL: No anorexia, nausea, vomiting or diarrhea. No abdominal pain or blood.  GENITOURINARY: No burning on urination, no polyuria NEUROLOGICAL: No headache, dizziness, syncope, paralysis, ataxia, numbness or tingling in the extremities. No change in bowel or bladder control.  MUSCULOSKELETAL: No muscle, back pain, joint pain or stiffness.  LYMPHATICS: No enlarged nodes. No history of splenectomy.  PSYCHIATRIC: No history of depression or anxiety.  ENDOCRINOLOGIC: No reports of sweating, cold or heat intolerance. No polyuria or polydipsia.  Marland Kitchen   Physical Examination Vitals:   10/17/22 1525 10/17/22 1600  BP: (!) 142/78 138/78  Pulse:    SpO2:     Filed Weights   10/17/22 1523  Weight: 193 lb 6.4 oz (87.7 kg)    Gen: resting comfortably, no acute distress HEENT: no scleral icterus, pupils equal round and reactive, no palptable cervical adenopathy,  CV: RRR, no mrg, no jvd Resp: Clear to auscultation bilaterally GI: abdomen is soft, non-tender, non-distended, normal bowel sounds, no hepatosplenomegaly MSK: extremities are warm, no edema.  Skin: warm, no rash Neuro:  no focal deficits Psych: appropriate affect   Diagnostic Studies  08/2015 echo Study Conclusions   - Left ventricle: The cavity size was normal. Wall thickness was   normal. Systolic function was normal. The estimated ejection   fraction was in the range of 55% to 60%. Wall motion was normal;   there were no regional wall motion abnormalities. Left   ventricular diastolic function  parameters were normal for the   patient&'s age. - Aortic valve: Mildly calcified annulus. Trileaflet. There was   trivial regurgitation. - Mitral valve: Calcified annulus. There was mild regurgitation. - Right ventricle: The cavity size was mildly dilated. - Right atrium: The atrium was mildly dilated. Central venous   pressure (est): 3 mm Hg. - Tricuspid valve: There was mild regurgitation. - Pulmonary arteries: PA peak pressure: 42 mm Hg (S). - Pericardium, extracardiac: There was no pericardial effusion.   Impressions:   - Normal LV wall thickness with LVEF 55-60%. Grossly normal   diastolic function. Mild MAC with mild mitral regurgitation.   Mildly sclerotic aortic annulus with trivial aortic   regurgitation. Mild RV enlargement with normal contraction. Mild   tricuspid regurgitation with elevated PASP 42 mmHg.   11/2018 PFTs Moderate obstructive disease   11/2018 echo IMPRESSIONS     1. Left ventricular ejection fraction, by visual estimation, is 60 to  65%. The left ventricle has normal function. There is no left ventricular  hypertrophy.   2. The left ventricle has no regional wall motion abnormalities.   3. Global right ventricle has normal systolic function.The right  ventricular size is normal. No increase in right ventricular wall  thickness.   4. Left atrial size was normal.   5. Right atrial size was normal.   6. The mitral valve is normal in structure. No evidence of mitral valve  regurgitation. No evidence of mitral stenosis.   7. The tricuspid valve is normal in structure. Tricuspid valve  regurgitation is not demonstrated.   8. The aortic valve is normal in structure. Aortic valve regurgitation is  not visualized. No evidence of aortic valve sclerosis or stenosis.   9. The pulmonic valve was normal in structure. Pulmonic valve  regurgitation is not visualized.  10. The inferior vena cava is normal in size with greater than 50%  respiratory variability,  suggesting right  atrial pressure of 3 mmHg.    Jan 2022  Left Ventricle  Normal left ventricular size. Wall thickness is normal. Systolic function is normal. EF: 60-65%. Wall motion is normal. Doppler parameters consistent with mild diastolic dysfunction and low to normal LA pressure.   Right Ventricle  Right ventricle is normal. Systolic function is normal.   Left Atrium  Left atrium is mildly dilated.   Right Atrium  Normal sized right atrium.   IVC/SVC  The inferior vena cava demonstrates a diameter of <=2.1 cm and collapses >50%; therefore, the right atrial pressure is estimated at 3 mmHg.   Mitral Valve  Mitral valve structure is normal. There is trace regurgitation.   Tricuspid Valve  Tricuspid valve structure is normal. There is trace regurgitation. The right ventricular systolic pressure is normal (<36 mmHg).   Aortic Valve  The aortic valve is tricuspid. The leaflets are not thickened and exhibit normal excursion. Trace regurgitation. There is no evidence of aortic valve stenosis.   Pulmonic Valve  The pulmonic valve was not well visualized. Trace regurgitation.   Ascending Aorta  The aortic root is normal in size.   Pericardium  There is no pericardial effusion.    Assessment and Plan   1. SOB/COPD -  benign extensive cardiac workup over the last few years including echo and stress test. Only abnormality was PFTs showing moderate obstruction which would appear to be the etiology of symptoms - some ongoing symptoms, refer to pulmonary   2. HTN - low bp's on hydrochlorothiazide, discontinued - unclear if increase to norvasc 5mg  last year caused LE edema, will d/c and start aldactone. If edema not improved could go back to norvasc. Check bmet 2 weeks.    3. Hyperlipidemia - LDL at goal, discussed dietary changes to lower TGs   4. History of CVA - s/p left CEA, the intended duration of his asa/plavix is per vascular   F/u 6 months  Antoine Poche,  M.D.

## 2022-10-17 NOTE — Patient Instructions (Addendum)
Medication Instructions:   Stop Amlodipine (Norvasc) Begin Spironolactone 25mg  daily  Continue all other medications.     Labwork:  BMET - order given Please do in 2 weeks Office will contact with results via phone, letter or mychart.     Testing/Procedures:  none  Follow-Up:  6 months   Any Other Special Instructions Will Be Listed Below (If Applicable).  You have been referred to:  Pulmonary   If you need a refill on your cardiac medications before your next appointment, please call your pharmacy.

## 2022-10-17 NOTE — Addendum Note (Signed)
Addended by: Lesle Chris on: 10/17/2022 04:23 PM   Modules accepted: Orders

## 2022-10-25 ENCOUNTER — Ambulatory Visit (INDEPENDENT_AMBULATORY_CARE_PROVIDER_SITE_OTHER): Payer: Medicare PPO

## 2022-10-25 DIAGNOSIS — Z23 Encounter for immunization: Secondary | ICD-10-CM

## 2022-10-31 ENCOUNTER — Other Ambulatory Visit: Payer: Medicare PPO

## 2022-10-31 DIAGNOSIS — I10 Essential (primary) hypertension: Secondary | ICD-10-CM | POA: Diagnosis not present

## 2022-10-31 DIAGNOSIS — Z79899 Other long term (current) drug therapy: Secondary | ICD-10-CM | POA: Diagnosis not present

## 2022-10-31 DIAGNOSIS — R0602 Shortness of breath: Secondary | ICD-10-CM | POA: Diagnosis not present

## 2022-11-01 LAB — BASIC METABOLIC PANEL
BUN/Creatinine Ratio: 20 (ref 10–24)
BUN: 17 mg/dL (ref 8–27)
CO2: 22 mmol/L (ref 20–29)
Calcium: 10.8 mg/dL — ABNORMAL HIGH (ref 8.6–10.2)
Chloride: 102 mmol/L (ref 96–106)
Creatinine, Ser: 0.84 mg/dL (ref 0.76–1.27)
Glucose: 150 mg/dL — ABNORMAL HIGH (ref 70–99)
Potassium: 4.5 mmol/L (ref 3.5–5.2)
Sodium: 139 mmol/L (ref 134–144)
eGFR: 89 mL/min/{1.73_m2} (ref >59)

## 2022-11-10 ENCOUNTER — Telehealth: Payer: Self-pay | Admitting: Cardiology

## 2022-11-10 NOTE — Telephone Encounter (Signed)
Pt c/o BP issue: STAT if pt c/o blurred vision, one-sided weakness or slurred speech  1. What are your last 5 BP readings?  10/17:  162/84 hr 51 10/17:  176/82 hr 51  10/17:  158/79 hr 51 10/18:  177/77  10/18:  172/77  10/19:  162/74 11/1:   178/78  11/1:   186/86   2. Are you having any other symptoms (ex. Dizziness, headache, blurred vision, passed out)? Leg swelling, "always dizzy", and she states he has the "start of a headache"   3. What is your BP issue? Pt daughter called in with pt stating pt's BP has been elevated. Please advise.

## 2022-11-13 NOTE — Telephone Encounter (Signed)
BP's too high, can he add hydralazine 25mg  bid to his current regimen and update [place] next week  Dominga Ferry MD

## 2022-11-14 MED ORDER — HYDRALAZINE HCL 25 MG PO TABS: 25.0000 mg | ORAL_TABLET | Freq: Two times a day (BID) | ORAL | 6 refills | Status: DC

## 2022-11-14 NOTE — Telephone Encounter (Signed)
Spoke with Kristi [facility]) - notified and verbalized understanding.  Will send new medication to [facility] now.

## 2022-11-22 ENCOUNTER — Telehealth: Payer: Self-pay | Admitting: Cardiology

## 2022-11-22 NOTE — Progress Notes (Unsigned)
Rodney Hernandez, male    DOB: 05/22/1945    MRN: [id number]   Brief patient profile:  5  yowm quit smoking 1983  with severe asthma as child referred to pulmonary clinic in [place]  11/23/2022 by Wyline Mood  for copd  with GOLD 2 COPD  in 11/2018     History of Present Illness  11/23/2022  Pulmonary/ 1st office eval/ Giselle Brutus / [place] Office maint on Breo 100 Chief Complaint  Patient presents with   Consult  Dyspnea:  limited  R >  L leg/ balance issues > sob  Cough: x years, choking sensation/   Worse p Breo  Sleep: bed is flat / one pillow every other night  1st thing in am phlegm white  SABA use: rarely  02: none    No obvious day to day or daytime pattern/variability or assoc  purulent sputum or mucus plugs or hemoptysis or cp or chest tightness, subjective wheeze or overt sinus or hb symptoms.    Also denies any obvious fluctuation of symptoms with weather or environmental changes or other aggravating or alleviating factors except as outlined above   No unusual exposure hx or h/o childhood pna  or knowledge of premature birth.  Current Allergies, Complete Past Medical History, Past Surgical History, Family History, and Social History were reviewed in [employer] record.  ROS  The following are not active complaints unless bolded Hoarseness, sore throat, dysphagia, dental problems, itching, sneezing,  nasal congestion or discharge of excess mucus or purulent secretions, ear ache,   fever, chills, sweats, unintended wt loss or wt gain, classically pleuritic or exertional cp,  orthopnea pnd or arm/hand swelling  or leg swelling, presyncope, palpitations, abdominal pain, anorexia, nausea, vomiting, diarrhea  or change in bowel habits or change in bladder habits, change in stools or change in urine, dysuria, hematuria,  rash, arthralgias, visual complaints, headache, numbness, weakness or ataxia or problems with walking or coordination,  change in mood or  memory.             Outpatient Medications Prior to Visit  Medication Sig Dispense Refill   Accu-Chek Softclix Lancets lancets Test BS up to QID Dx E11.9 100 each 12   albuterol (VENTOLIN HFA) 108 (90 Base) MCG/ACT inhaler TAKE 2 PUFFS BY MOUTH EVERY 6 HOURS AS NEEDED FOR WHEEZE OR SHORTNESS OF BREATH 6.7 g 1   aspirin 81 MG chewable tablet Chew 81 mg by mouth daily.     atorvastatin (LIPITOR) 40 MG tablet Take 1 tablet (40 mg total) by mouth daily. 90 tablet 1   blood glucose meter kit and supplies KIT Dispense based on patient and insurance preference. Use up to four times daily as directed. (FOR ICD-9 250.00, 250.01). 1 each 0   cholecalciferol (VITAMIN D3) 25 MCG (1000 UNIT) tablet Take 1 tablet (1,000 Units total) by mouth daily. 90 tablet 3   clopidogrel (PLAVIX) 75 MG tablet Take 1 tablet (75 mg total) by mouth daily. 90 tablet 1   escitalopram (LEXAPRO) 20 MG tablet Take 1 tablet (20 mg total) by mouth daily. 90 tablet 1   fluticasone furoate-vilanterol (BREO ELLIPTA) 100-25 MCG/ACT AEPB Inhale 1 puff into the lungs daily. 1 each 11   glucose blood (ACCU-CHEK GUIDE) test strip Test BS up to QID Dx E11.9 100 each 12   hydrALAZINE (APRESOLINE) 25 MG tablet Take 1 tablet (25 mg total) by mouth 2 (two) times daily. 60 tablet 6   iron polysaccharides (NIFEREX) 150 MG  capsule TAKE 1 CAPSULE BY MOUTH EVERY DAY 90 capsule 1   Leuprolide Acetate, 6 Month, (LUPRON) 45 MG injection Inject 45 mg into the muscle every 6 (six) months.     metFORMIN (GLUCOPHAGE) 500 MG tablet Take 1 tablet (500 mg total) by mouth 2 (two) times daily with a meal. 180 tablet 3   metoprolol succinate (TOPROL-XL) 25 MG 24 hr tablet Take 0.5 tablets (12.5 mg total) by mouth daily. 90 tablet 1   olmesartan (BENICAR) 40 MG tablet Take 1 tablet (40 mg total) by mouth daily. 90 tablet 1   pantoprazole (PROTONIX) 40 MG tablet Take 1 tablet (40 mg total) by mouth 2 (two) times daily. 180 tablet 1   spironolactone (ALDACTONE) 25 MG tablet  Take 1 tablet (25 mg total) by mouth daily. 30 tablet 6   sucralfate (CARAFATE) 1 g tablet Take 1 g by mouth daily as needed.     traMADol (ULTRAM) 50 MG tablet Take 1 tablet (50 mg total) by mouth 2 (two) times daily. 60 tablet 2   No facility-administered medications prior to visit.    Past Medical History:  Diagnosis Date   Arthritis    psoriatic arthritis and osteo arthritis   Asthma    Cancer (HCC)    Prostate   Carotid artery stenosis    a.s/p L CEA in 01/2020   Collapse of lung    Colon polyps    DOE (dyspnea on exertion) 12/02/2018   Eczema    GERD (gastroesophageal reflux disease)    Hypertension    Seasonal allergies    Stroke (HCC)    Vertigo       Objective:     BP (!) 150/63   Pulse 63   Ht 5' 7.5" (1.715 m)   Wt 193 lb 9.6 oz (87.8 kg)   SpO2 93% Comment: ra  BMI 29.87 kg/m   SpO2: 93 % (ra)  Amb wm easily confused re details of care    HEENT : Oropharynx  clear   Nasal turbinates nl / phonation is nl    NECK :  without  apparent JVD/ palpable Nodes/TM    LUNGS: no acc muscle use,  Min barrel  contour chest wall with bilateral  slightly decreased bs s audible wheeze and  without cough on insp or exp maneuvers and min  Hyperresonant  to  percussion bilaterally    CV:  RRR  no s3 or murmur or increase in P2, and no edema   ABD:  obese soft and nontender with pos end  insp Hoover's  in the supine position.  No bruits or organomegaly appreciated   MS:  Nl gait/ ext warm without deformities Or obvious joint restrictions  calf tenderness, cyanosis or clubbing     SKIN: warm and dry without lesions    NEURO:  alert, a   no motor or cerebellar deficits apparent.      CXR PA and Lateral:   11/23/2022 :    I personally reviewed images and impression is as follows:     Mild CM / no acute findings / nl to small lung vol       Assessment   COPD  GOLD 2 Quit smoking 1983 with h/o ? Severe asthma as child only rx saba  - Allergy screen 08/29/22  >  Eos 0.5   - 11/23/2022  d/c breo due to cough/ choking spells plus  - 11/23/2022  After extensive coaching inhaler device,  effectiveness =  60% with smi > try spiriva samples and max gerd rx and depomedrol 120 mg IM    When respiratory symptoms begin or become refractory well after a patient reports complete smoking cessation,  Especially when this wasn't the case while they were smoking, a red flag is raised based on the work of Dr Primitivo Gauze which states:  if you quit smoking when your best day FEV1 is still well preserved it is highly unlikely you will progress to severe disease.  That is to say, once the smoking stops,  the symptoms should not suddenly erupt or markedly worsen.  If so, the differential diagnosis should include  obesity/deconditioning,  LPR/Reflux/Aspiration syndromes,  occult CHF, or  especially side effect of medications commonly used in this population - especially acei(no record of here but had care elsewhere and really shaky on med hx)  and drypowder inhalers    Clearly not limited by doe at this point but by ortho/ neuro issues so best option with cough to point of gag/choking so rec  1) d/c BREO 2) Try  to train on smi (causes least cough of all the inhalers outside of the nebs, which clearly aren't needed here, and just use back up saba hfa for now . 3) max gerd rx  4) depomedrol 120 mg IM  in view of high Eos suggesting  component of Th-2 driven upper or lower airways inflammation (if cough responds short term only to relapse before return while will on full rx for uacs (as above), then  that would point to allergic rhinitis/ asthma or eos bronchitis as alternative dx)  5) return in 4 weeks  with all meds in hand using a trust but verify approach to confirm accurate Medication  Reconciliation The principal here is that until we are certain that the  patients are doing what we've asked, it makes no sense to ask them to do more.          Each maintenance medication  was reviewed in detail including emphasizing most importantly the difference between maintenance and prns and under what circumstances the prns are to be triggered using an action plan format where appropriate.  Total time for H and P, chart review, counseling, reviewing hfa/dpi/smi device(s) and generating customized AVS unique to this office visit / same day charting  > 60 min new pt eval           Sandrea Hughs, MD 11/23/2022

## 2022-11-22 NOTE — Telephone Encounter (Signed)
Daughter was suppose to c/b with BP Readings  11/06 163/80 60 11/07 136/75 64 11/08 201/91 59, 200/92 59 11/09 162/70 61 11/10 no reading 11/11 160/66 66 11/12 160/76 68 11/13 no reading   Also Daughter has noticed deceased swelling in legs

## 2022-11-23 ENCOUNTER — Ambulatory Visit: Payer: Medicare PPO | Admitting: Internal Medicine

## 2022-11-23 ENCOUNTER — Ambulatory Visit (HOSPITAL_COMMUNITY)
Admission: RE | Admit: 2022-11-23 | Discharge: 2022-11-23 | Disposition: A | Discharge status: Home / Self Care | Payer: Medicare PPO | Source: Ambulatory Visit | Attending: Internal Medicine | Admitting: Internal Medicine

## 2022-11-23 ENCOUNTER — Encounter: Payer: Self-pay | Admitting: Internal Medicine

## 2022-11-23 VITALS — BP 150/63 | HR 63 | Ht 67.5 in | Wt 193.6 lb

## 2022-11-23 DIAGNOSIS — R058 Other specified cough: Secondary | ICD-10-CM | POA: Diagnosis not present

## 2022-11-23 DIAGNOSIS — J449 Chronic obstructive pulmonary disease, unspecified: Secondary | ICD-10-CM | POA: Diagnosis not present

## 2022-11-23 LAB — LAB REPORT - SCANNED: Microalb Creat Ratio: 88

## 2022-11-23 MED ORDER — METHYLPREDNISOLONE ACETATE 80 MG/ML IJ SUSP
80.0000 mg | Freq: Once | INTRAMUSCULAR | Status: AC
Start: 2022-11-23 — End: 2022-11-23
  Administered 2022-11-23: 80 mg via INTRAMUSCULAR

## 2022-11-23 MED ORDER — SPIRIVA RESPIMAT 2.5 MCG/ACT IN AERS: 2.0000 | INHALATION_SPRAY | Freq: Every day | RESPIRATORY_TRACT | Status: DC

## 2022-11-23 NOTE — Assessment & Plan Note (Addendum)
Quit smoking 1983 with h/o ? Severe asthma as child only rx saba  - Allergy screen 08/29/22 >  Eos 0.5   - 11/23/2022  d/c breo due to cough/ choking spells plus  - 11/23/2022  After extensive coaching inhaler device,  effectiveness =   60% with smi > try spiriva samples and max gerd rx and depomedrol 120 mg IM    When respiratory symptoms begin or become refractory well after a patient reports complete smoking cessation,  Especially when this wasn't the case while they were smoking, a red flag is raised based on the work of Dr Primitivo Gauze which states:  if you quit smoking when your best day FEV1 is still well preserved it is highly unlikely you will progress to severe disease.  That is to say, once the smoking stops,  the symptoms should not suddenly erupt or markedly worsen.  If so, the differential diagnosis should include  obesity/deconditioning,  LPR/Reflux/Aspiration syndromes,  occult CHF, or  especially side effect of medications commonly used in this population - especially acei(no record of here but had care elsewhere and really shaky on med hx)  and drypowder inhalers    Clearly not limited by doe at this point but by ortho/ neuro issues so best option with cough to point of gag/choking so rec  1) d/c BREO 2) Try  to train on smi (causes least cough of all the inhalers outside of the nebs, which clearly aren't needed here, and just use back up saba hfa for now . 3) max gerd rx  4) depomedrol 120 mg IM  in view of high Eos suggesting  component of Th-2 driven upper or lower airways inflammation (if cough responds short term only to relapse before return while will on full rx for uacs (as above), then  that would point to allergic rhinitis/ asthma or eos bronchitis as alternative dx)  5) return in 4 weeks  with all meds in hand using a trust but verify approach to confirm accurate Medication  Reconciliation The principal here is that until we are certain that the  patients are doing what we've  asked, it makes no sense to ask them to do more.          Each maintenance medication was reviewed in detail including emphasizing most importantly the difference between maintenance and prns and under what circumstances the prns are to be triggered using an action plan format where appropriate.  Total time for H and P, chart review, counseling, reviewing hfa/dpi/smi device(s) and generating customized AVS unique to this office visit / same day charting  > 60 min new pt eval

## 2022-11-23 NOTE — Patient Instructions (Addendum)
Pantoprazole 40 mg Take 30- 60 min before your first and last meals of the day   GERD (REFLUX)  is an extremely common cause of respiratory symptoms just like yours , many times with no obvious heartburn at all.    It can be treated with medication, but also with lifestyle changes including elevation of the head of your bed (ideally with 6 -8inch blocks under the headboard of your bed),  Smoking cessation, avoidance of late meals, excessive alcohol, and avoid fatty foods, chocolate, peppermint, colas, red wine, and acidic juices such as orange juice.  NO MINT OR MENTHOL PRODUCTS SO NO COUGH DROPS  USE SUGARLESS CANDY INSTEAD (Jolley ranchers or Stover's or Life Savers) or even ice chips will also do - the key is to swallow to prevent all throat clearing. NO OIL BASED VITAMINS - use powdered substitutes.  Avoid fish oil when coughing.    Plan A = Automatic = Always=   Stiolto 1 or 2 puffs 1st thing in am  - stop if any problem passing your water          Only use your albuterol as a rescue medication to be used if you can't catch your breath by resting or doing a relaxed purse lip breathing pattern.  - The less you use it, the better it will work when you need it. - Ok to use up to 2 puffs  every 4 hours if you must but call for immediate appointment if use goes up over your usual need - Don't leave home without it !!  (think of it like the spare tire for your car)   Also  Ok to try albuterol 15 min before an activity (on alternating days)  that you know would usually make you short of breath and see if it makes any difference and if makes none then don't take albuterol after activity unless you can't catch your breath as this means it's the resting that helps, not the albuterol.   For cough /congestion  >  mucinex dm 1200 mg every 12 hours as needed   Depomedrol 120 mg IM   Please remember to go to the  x-ray department  @  [facility] for your tests - we will call you with the  results when they are available      Please schedule a follow up office visit in 4 weeks, sooner if needed  with all medications /inhalers/ solutions in hand so we can verify exactly what you are taking. This includes all medications from all doctors and over the counters

## 2022-11-24 NOTE — Telephone Encounter (Signed)
Bp still too high, please increase hydralazine to 50mg  bid. Update [place] next Friday on bp's   Dominga Ferry MD

## 2022-11-27 ENCOUNTER — Telehealth: Payer: Self-pay | Admitting: Internal Medicine

## 2022-11-27 MED ORDER — SPIRIVA RESPIMAT 2.5 MCG/ACT IN AERS: 2.0000 | INHALATION_SPRAY | Freq: Every day | RESPIRATORY_TRACT | 5 refills | Status: DC

## 2022-11-27 MED ORDER — HYDRALAZINE HCL 50 MG PO TABS: 50.0000 mg | ORAL_TABLET | Freq: Two times a day (BID) | ORAL | 2 refills | Status: DC

## 2022-11-27 NOTE — Addendum Note (Signed)
Addended by: Carmelina Paddock on: 11/27/2022 11:37 AM   Modules accepted: Orders

## 2022-11-27 NOTE — Telephone Encounter (Signed)
Rx sent to pharmacy   

## 2022-11-27 NOTE — Telephone Encounter (Signed)
Patient was seen by Dr. Sherene Sires on 11/23/22 and was given samples of Spiriva---daughter Rodney Hernandez) states this is working for him and he would like a prescription called to [facility] [phone] Mayford Knife call back is [phone]

## 2022-11-27 NOTE — Telephone Encounter (Signed)
Left message for patient to call the office

## 2022-11-27 NOTE — Telephone Encounter (Signed)
Spoke with daughter Vernona Rieger: Patient informed and verbalized understanding of plan.

## 2022-12-01 ENCOUNTER — Ambulatory Visit (INDEPENDENT_AMBULATORY_CARE_PROVIDER_SITE_OTHER): Payer: Medicare PPO | Admitting: Nurse Practitioner

## 2022-12-01 ENCOUNTER — Telehealth: Payer: Self-pay | Admitting: Cardiology

## 2022-12-01 ENCOUNTER — Encounter: Payer: Self-pay | Admitting: Nurse Practitioner

## 2022-12-01 VITALS — BP 138/72 | HR 61 | Temp 97.4°F | Ht 67.0 in | Wt 189.0 lb

## 2022-12-01 DIAGNOSIS — I10 Essential (primary) hypertension: Secondary | ICD-10-CM

## 2022-12-01 DIAGNOSIS — N401 Enlarged prostate with lower urinary tract symptoms: Secondary | ICD-10-CM | POA: Diagnosis not present

## 2022-12-01 DIAGNOSIS — Z7984 Long term (current) use of oral hypoglycemic drugs: Secondary | ICD-10-CM

## 2022-12-01 DIAGNOSIS — D509 Iron deficiency anemia, unspecified: Secondary | ICD-10-CM

## 2022-12-01 DIAGNOSIS — I6930 Unspecified sequelae of cerebral infarction: Secondary | ICD-10-CM | POA: Diagnosis not present

## 2022-12-01 DIAGNOSIS — R972 Elevated prostate specific antigen [PSA]: Secondary | ICD-10-CM | POA: Diagnosis not present

## 2022-12-01 DIAGNOSIS — I63239 Cerebral infarction due to unspecified occlusion or stenosis of unspecified carotid arteries: Secondary | ICD-10-CM | POA: Diagnosis not present

## 2022-12-01 DIAGNOSIS — J449 Chronic obstructive pulmonary disease, unspecified: Secondary | ICD-10-CM

## 2022-12-01 DIAGNOSIS — E785 Hyperlipidemia, unspecified: Secondary | ICD-10-CM | POA: Diagnosis not present

## 2022-12-01 DIAGNOSIS — E1169 Type 2 diabetes mellitus with other specified complication: Secondary | ICD-10-CM | POA: Diagnosis not present

## 2022-12-01 DIAGNOSIS — E119 Type 2 diabetes mellitus without complications: Secondary | ICD-10-CM | POA: Diagnosis not present

## 2022-12-01 DIAGNOSIS — F321 Major depressive disorder, single episode, moderate: Secondary | ICD-10-CM | POA: Diagnosis not present

## 2022-12-01 DIAGNOSIS — R600 Localized edema: Secondary | ICD-10-CM

## 2022-12-01 LAB — BAYER DCA HB A1C WAIVED: HB A1C (BAYER DCA - WAIVED): 8 % — ABNORMAL HIGH (ref 4.8–5.6)

## 2022-12-01 MED ORDER — METFORMIN HCL 1000 MG PO TABS: 1000.0000 mg | ORAL_TABLET | Freq: Two times a day (BID) | ORAL | 3 refills | Status: DC

## 2022-12-01 NOTE — Telephone Encounter (Signed)
Pt c/o BP issue: STAT if pt c/o blurred vision, one-sided weakness or slurred speech  1. What are your last 5 BP readings?   11/18: 126/65 58 1:00p            162/75 54 3:45p  11/19: 152/66 55 1:50p (started new med dose)  11/20: 158/72 57 3:00p  11/21: 141/65 57 2:15p  11/22: 145/65 59   2. Are you having any other symptoms (ex. Dizziness, headache, blurred vision, passed out)?  No   3. What is your BP issue?   Daughter is calling to report BP readings.

## 2022-12-01 NOTE — Patient Instructions (Signed)

## 2022-12-01 NOTE — Telephone Encounter (Signed)
Forward to Dr Wyline Mood

## 2022-12-01 NOTE — Progress Notes (Signed)
That'  Subjective:    Patient ID: Rodney Hernandez, male    DOB: 1945-04-16, 77 y.o.   MRN: [id number]  Chief Complaint: medical management of chronic issues     HPI:  Rodney Hernandez is a 77 y.o. who identifies as a male who was assigned male at birth.   Social history: Lives with: by hisself- his daughters check on him daily Work history: retired   Water engineer in today for follow up of the following chronic medical issues:  1. Essential hypertension No c/o chest pain, sob or headache. Does check blood pressure at home. Blood pressure has been running around 150's systolic. BP Readings from Last 3 Encounters:  11/23/22 (!) 150/63  10/17/22 138/78  08/29/22 (!) 150/72     2. Carotid stenosis, symptomatic, with infarction [facility]) Patient states that had doppler done in oct 2023 and was only 40% occluded. He denies any dizziness  3. COPD  GOLD 2 Patient saw DR. Wert 11/23/22. They stopped his BREO and started him on spirivia. That has really helped with his cough.  4. Diabetes mellitus treated with oral medication (HCC) Fasting blood sugars are running around 140-170. He does not check it every day.Patient did not want to change or add meds at last visit, wanted to try diet. Lab Results  Component Value Date   HGBA1C 8.5 (H) 08/29/2022     5. Hyperlipidemia associated with type 2 diabetes mellitus (HCC) Does not watch diet very closely and is mot as active as he use to be. Lab Results  Component Value Date   CHOL 123 08/29/2022   HDL 43 08/29/2022   LDLCALC 50 08/29/2022   TRIG 181 (H) 08/29/2022   CHOLHDL 2.9 08/29/2022     6. Iron deficiency anemia, unspecified iron deficiency anemia type No c/o fatigue Lab Results  Component Value Date   HGB 12.8 (L) 08/29/2022     7. Late effect of cerebrovascular accident (CVA) No permanent effects  8. Depression, major, single episode, moderate (HCC) Is on lexapro and is doing well    12/01/2022    2:10 PM 08/29/2022    2:45 PM  06/26/2022   12:08 PM  Depression screen PHQ 2/9  Decreased Interest 1 0 0  Down, Depressed, Hopeless 1 0 0  PHQ - 2 Score 2 0 0  Altered sleeping 3 2 0  Tired, decreased energy 3 1 0  Change in appetite 2 1 0  Feeling bad or failure about yourself  1 0 0  Trouble concentrating 2 3 0  Moving slowly or fidgety/restless 0 0 0  Suicidal thoughts 0 0 0  PHQ-9 Score 13 7 0  Difficult doing work/chores Somewhat difficult Not difficult at all Not difficult at all     9. Peripheral edema Has occasional swelling of lower ext. Has been beter  10. Elevated prostate specific antigen (PSA) 11. Benign prostatic hyperplasia with lower urinary tract symptoms, symptom details unspecified No recent voiding issues Lab Results  Component Value Date   PSA1 0.3 05/15/2022   PSA1 0.8 11/11/2021   PSA1 2.2 05/27/2021       New complaints: None today  Allergies  Allergen Reactions   Nsaids Shortness Of Breath   Aspirin Other (See Comments)    Affects patient breathing   Morphine And Codeine Hives and Itching   Sulfa Antibiotics    Outpatient Encounter Medications as of 12/01/2022  Medication Sig   Accu-Chek Softclix Lancets lancets Test BS up to QID Dx E11.9  albuterol (VENTOLIN HFA) 108 (90 Base) MCG/ACT inhaler TAKE 2 PUFFS BY MOUTH EVERY 6 HOURS AS NEEDED FOR WHEEZE OR SHORTNESS OF BREATH   aspirin 81 MG chewable tablet Chew 81 mg by mouth daily.   atorvastatin (LIPITOR) 40 MG tablet Take 1 tablet (40 mg total) by mouth daily.   blood glucose meter kit and supplies KIT Dispense based on patient and insurance preference. Use up to four times daily as directed. (FOR ICD-9 250.00, 250.01).   cholecalciferol (VITAMIN D3) 25 MCG (1000 UNIT) tablet Take 1 tablet (1,000 Units total) by mouth daily.   clopidogrel (PLAVIX) 75 MG tablet Take 1 tablet (75 mg total) by mouth daily.   escitalopram (LEXAPRO) 20 MG tablet Take 1 tablet (20 mg total) by mouth daily.   glucose blood (ACCU-CHEK GUIDE)  test strip Test BS up to QID Dx E11.9   hydrALAZINE (APRESOLINE) 50 MG tablet Take 1 tablet (50 mg total) by mouth 2 (two) times daily.   iron polysaccharides (NIFEREX) 150 MG capsule TAKE 1 CAPSULE BY MOUTH EVERY DAY   Leuprolide Acetate, 6 Month, (LUPRON) 45 MG injection Inject 45 mg into the muscle every 6 (six) months.   metFORMIN (GLUCOPHAGE) 500 MG tablet Take 1 tablet (500 mg total) by mouth 2 (two) times daily with a meal.   metoprolol succinate (TOPROL-XL) 25 MG 24 hr tablet Take 0.5 tablets (12.5 mg total) by mouth daily.   olmesartan (BENICAR) 40 MG tablet Take 1 tablet (40 mg total) by mouth daily.   pantoprazole (PROTONIX) 40 MG tablet Take 1 tablet (40 mg total) by mouth 2 (two) times daily.   spironolactone (ALDACTONE) 25 MG tablet Take 1 tablet (25 mg total) by mouth daily.   sucralfate (CARAFATE) 1 g tablet Take 1 g by mouth daily as needed.   Tiotropium Bromide Monohydrate (SPIRIVA RESPIMAT) 2.5 MCG/ACT AERS Inhale 2 puffs into the lungs daily.   traMADol (ULTRAM) 50 MG tablet Take 1 tablet (50 mg total) by mouth 2 (two) times daily.   No facility-administered encounter medications on file as of 12/01/2022.    Past Surgical History:  Procedure Laterality Date   collapsed lung     HERNIA REPAIR     PROSTATE SURGERY      Family History  Problem Relation Age of Onset   Lung cancer Father    Prostate cancer Father    Stomach cancer Maternal Grandmother    Prostate cancer Paternal Uncle    Cervical cancer Daughter    Psoriasis Daughter    Arthritis Daughter        psoriatic arthritis      Controlled substance contract: n/a     Review of Systems  Constitutional:  Negative for diaphoresis.  Eyes:  Negative for pain.  Respiratory:  Negative for shortness of breath.   Cardiovascular:  Negative for chest pain, palpitations and leg swelling.  Gastrointestinal:  Negative for abdominal pain.  Endocrine: Negative for polydipsia.  Skin:  Negative for rash.   Neurological:  Negative for dizziness, weakness and headaches.  Hematological:  Does not bruise/bleed easily.  All other systems reviewed and are negative.      Objective:   Physical Exam Vitals and nursing note reviewed.  Constitutional:      Appearance: Normal appearance. He is well-developed.  HENT:     Head: Normocephalic.     Nose: Nose normal.     Mouth/Throat:     Mouth: Mucous membranes are moist.     Pharynx: Oropharynx is  clear.  Eyes:     Pupils: Pupils are equal, round, and reactive to light.  Neck:     Thyroid: No thyroid mass or thyromegaly.     Vascular: No carotid bruit or JVD.     Trachea: Phonation normal.  Cardiovascular:     Rate and Rhythm: Normal rate and regular rhythm.  Pulmonary:     Effort: Pulmonary effort is normal. No respiratory distress.     Breath sounds: Normal breath sounds.  Abdominal:     General: Bowel sounds are normal.     Palpations: Abdomen is soft.     Tenderness: There is no abdominal tenderness.  Musculoskeletal:        General: Normal range of motion.     Cervical back: Normal range of motion and neck supple.  Lymphadenopathy:     Cervical: No cervical adenopathy.  Skin:    General: Skin is warm and dry.  Neurological:     Mental Status: He is alert and oriented to person, place, and time.  Psychiatric:        Behavior: Behavior normal.        Thought Content: Thought content normal.        Judgment: Judgment normal.     BP 138/72 (Cuff Size: Normal)   Pulse 61   Temp (!) 97.4 F (36.3 C) (Temporal)   Ht 5\' 7"  (1.702 m)   Wt 189 lb (85.7 kg)   SpO2 95%   BMI 29.60 kg/m    Hgbac1 8.0%     Assessment & Plan:  Rodney Hernandez comes in today with chief complaint of Medical Management of Chronic Issues   Diagnosis and orders addressed:  1. Essential hypertension Keep diary of blood pressure at home Dash diet - CBC with Differential/Platelet - CMP14+EGFR  2. Carotid stenosis, symptomatic, with infarction  [facility]) Cardiology will recheck at next visit  3. COPD  GOLD 2 Keep follow up with pulmonology  4. Diabetes mellitus treated with oral medication (HCC) Increased metformin to 1000mg  bid Watch carbs in diet - Bayer DCA Hb A1c Waived - Microalbumin / creatinine urine ratio - Bayer DCA Hb A1c Waived - metFORMIN (GLUCOPHAGE) 1000 MG tablet; Take 1 tablet (1,000 mg total) by mouth 2 (two) times daily with a meal.  Dispense: 180 tablet; Refill: 3  5. Hyperlipidemia associated with type 2 diabetes mellitus (HCC) Low fat diet - Lipid panel  6. Iron deficiency anemia, unspecified iron deficiency anemia type Labs pending  7. Late effect of cerebrovascular accident (CVA)  8. Depression, major, single episode, moderate (HCC) Stress management  9. Peripheral edema Elevate legs when develop edema  10. Elevated prostate specific antigen (PSA)  11. Benign prostatic hyperplasia with lower urinary tract symptoms, symptom details unspecified Labs pending   Labs pending Health Maintenance reviewed Diet and exercise encouraged  Follow up plan: 3 months   Mary-Margaret Daphine Deutscher, FNP

## 2022-12-02 LAB — CMP14+EGFR
ALT: 16 [IU]/L (ref 0–44)
AST: 15 [IU]/L (ref 0–40)
Albumin: 4.4 g/dL (ref 3.8–4.8)
Alkaline Phosphatase: 153 [IU]/L — ABNORMAL HIGH (ref 44–121)
BUN/Creatinine Ratio: 18 (ref 10–24)
BUN: 19 mg/dL (ref 8–27)
Bilirubin Total: 0.2 mg/dL (ref 0.0–1.2)
CO2: 25 mmol/L (ref 20–29)
Calcium: 11.4 mg/dL — ABNORMAL HIGH (ref 8.6–10.2)
Chloride: 98 mmol/L (ref 96–106)
Creatinine, Ser: 1.07 mg/dL (ref 0.76–1.27)
Globulin, Total: 2.2 g/dL (ref 1.5–4.5)
Glucose: 173 mg/dL — ABNORMAL HIGH (ref 70–99)
Potassium: 4.8 mmol/L (ref 3.5–5.2)
Sodium: 138 mmol/L (ref 134–144)
Total Protein: 6.6 g/dL (ref 6.0–8.5)
eGFR: 71 mL/min/{1.73_m2}

## 2022-12-02 LAB — CBC WITH DIFFERENTIAL/PLATELET
Basophils Absolute: 0.1 10*3/uL (ref 0.0–0.2)
Basos: 1 %
EOS (ABSOLUTE): 0.3 10*3/uL (ref 0.0–0.4)
Eos: 2 %
Hematocrit: 43 % (ref 37.5–51.0)
Hemoglobin: 13.6 g/dL (ref 13.0–17.7)
Immature Grans (Abs): 0.1 10*3/uL (ref 0.0–0.1)
Immature Granulocytes: 1 %
Lymphocytes Absolute: 2 10*3/uL (ref 0.7–3.1)
Lymphs: 16 %
MCH: 27.4 pg (ref 26.6–33.0)
MCHC: 31.6 g/dL (ref 31.5–35.7)
MCV: 87 fL (ref 79–97)
Monocytes Absolute: 1.2 10*3/uL — ABNORMAL HIGH (ref 0.1–0.9)
Monocytes: 10 %
Neutrophils Absolute: 8.6 10*3/uL — ABNORMAL HIGH (ref 1.4–7.0)
Neutrophils: 70 %
Platelets: 347 10*3/uL (ref 150–450)
RBC: 4.96 x10E6/uL (ref 4.14–5.80)
RDW: 12.8 % (ref 11.6–15.4)
WBC: 12.3 10*3/uL — ABNORMAL HIGH (ref 3.4–10.8)

## 2022-12-02 LAB — LIPID PANEL
Chol/HDL Ratio: 2.7 ratio (ref 0.0–5.0)
Cholesterol, Total: 114 mg/dL (ref 100–199)
HDL: 42 mg/dL (ref >39)
LDL Chol Calc (NIH): 45 mg/dL (ref 0–99)
Triglycerides: 162 mg/dL — ABNORMAL HIGH (ref 0–149)
VLDL Cholesterol Cal: 27 mg/dL (ref 5–40)

## 2022-12-03 LAB — MICROALBUMIN / CREATININE URINE RATIO
Creatinine, Urine: 59 mg/dL
Microalb/Creat Ratio: 37 mg/g{creat} — ABNORMAL HIGH (ref 0–29)
Microalbumin, Urine: 22 ug/mL

## 2022-12-06 MED ORDER — HYDRALAZINE HCL 50 MG PO TABS: 75.0000 mg | ORAL_TABLET | Freq: Two times a day (BID) | ORAL | 3 refills | Status: DC

## 2022-12-06 NOTE — Telephone Encounter (Signed)
BP's remain too high, clarify taking hydralazine 50mg  bid and if so increase to 75mg  bid  Dominga Ferry MD

## 2022-12-06 NOTE — Telephone Encounter (Signed)
Spoke to patients daughter who verified pt was taking Hydralazine 50 mg BID. Daughter will increase medication to 75 mg BID. Daughter had no further questions or concerns at this time.

## 2022-12-06 NOTE — Telephone Encounter (Signed)
Left a message for daughter to call office back

## 2022-12-20 NOTE — Progress Notes (Unsigned)
Rodney Hernandez, male    DOB: 17-Jun-1945    MRN: [id number]   Brief patient profile:  38  yowm quit smoking 1983  with severe asthma as child referred to pulmonary clinic in [place]  11/23/2022 by Dr Wyline Mood  for copd  with dx of  GOLD 2 COPD  in 11/2018      History of Present Illness  11/23/2022  Pulmonary/ 1st office eval/ Traylon Schimming / [place] [facility] maint on Breo 100 Chief Complaint  Patient presents with   Consult  Dyspnea:  limited  R >  L leg/ balance issues > sob  Cough: x years, choking sensation/   Worse p Breo  Sleep: bed is flat / one pillow every other night  1st thing in am phlegm white  SABA use: rarely  02: none  Rec Pantoprazole 40 mg Take 30- 60 min before your first and last meals of the day  GERD diet reviewed, bed blocks rec  Plan A = Automatic = Always=   Stiolto 1 or 2 puffs 1st thing in am  - stop if any problem passing your water  Only use your albuterol as a rescue medication  Also  Ok to try albuterol 15 min before an activity (on alternating days)  that you know would usually make you short of breath For cough /congestion  >  mucinex dm 1200 mg every 12 hours as needed  Depomedrol 120 mg IM  Please remember to go to the  x-ray department  : cxr ok  Please schedule a follow up office visit in 4 weeks, sooner if needed  with all medications /inhalers/ solutions in hand       12/21/2022  f/u ov/[place] [facility]/Emie Sommerfeld re: GOLD 2 copd  maint on spiriva did not bring meds  Chief Complaint  Patient presents with   COPD   Cough   Shortness of Breath  Dyspnea:  limited by balance  Cough: worse p meals / typically 30 min p and hurts in both sides of chest p severe coughing fits  Sleeping: bed is still flat/   one pillow still coughing  SABA use: none  02: none      No obvious day to day or daytime variability or assoc excess/ purulent sputum or mucus plugs or hemoptysis or cp or chest tightness, subjective wheeze or overt sinus or hb symptoms.    Also  denies any obvious fluctuation of symptoms with weather or environmental changes or other aggravating or alleviating factors except as outlined above   No unusual exposure hx or h/o childhood pna knowledge of premature birth.  Current Allergies, Complete Past Medical History, Past Surgical History, Family History, and Social History were reviewed in [employer] record.  ROS  The following are not active complaints unless bolded Hoarseness, sore throat, dysphagia, dental problems, itching, sneezing,  nasal congestion or discharge of excess mucus or purulent secretions, ear ache,   fever, chills, sweats, unintended wt loss or wt gain, classically pleuritic or exertional cp,  orthopnea pnd or arm/hand swelling  or leg swelling, presyncope, palpitations, abdominal pain, anorexia, nausea, vomiting, diarrhea  or change in bowel habits or change in bladder habits, change in stools or change in urine, dysuria, hematuria,  rash, arthralgias, visual complaints, headache, numbness, weakness or ataxia or problems with walking or coordination,  change in mood or  memory.        Current Meds  Medication Sig   Accu-Chek Softclix Lancets lancets Test BS up to  QID Dx E11.9   albuterol (VENTOLIN HFA) 108 (90 Base) MCG/ACT inhaler TAKE 2 PUFFS BY MOUTH EVERY 6 HOURS AS NEEDED FOR WHEEZE OR SHORTNESS OF BREATH   aspirin 81 MG chewable tablet Chew 81 mg by mouth daily.   atorvastatin (LIPITOR) 40 MG tablet Take 1 tablet (40 mg total) by mouth daily.   blood glucose meter kit and supplies KIT Dispense based on patient and insurance preference. Use up to four times daily as directed. (FOR ICD-9 250.00, 250.01).   cholecalciferol (VITAMIN D3) 25 MCG (1000 UNIT) tablet Take 1 tablet (1,000 Units total) by mouth daily.   clopidogrel (PLAVIX) 75 MG tablet Take 1 tablet (75 mg total) by mouth daily.   escitalopram (LEXAPRO) 20 MG tablet Take 1 tablet (20 mg total) by mouth daily.   glucose blood  (ACCU-CHEK GUIDE) test strip Test BS up to QID Dx E11.9   hydrALAZINE (APRESOLINE) 50 MG tablet Take 1.5 tablets (75 mg total) by mouth 2 (two) times daily.   iron polysaccharides (NIFEREX) 150 MG capsule TAKE 1 CAPSULE BY MOUTH EVERY DAY   Leuprolide Acetate, 6 Month, (LUPRON) 45 MG injection Inject 45 mg into the muscle every 6 (six) months.   metFORMIN (GLUCOPHAGE) 1000 MG tablet Take 1 tablet (1,000 mg total) by mouth 2 (two) times daily with a meal.   metoprolol succinate (TOPROL-XL) 25 MG 24 hr tablet Take 0.5 tablets (12.5 mg total) by mouth daily.   olmesartan (BENICAR) 40 MG tablet Take 1 tablet (40 mg total) by mouth daily.   pantoprazole (PROTONIX) 40 MG tablet Take 1 tablet (40 mg total) by mouth 2 (two) times daily.   spironolactone (ALDACTONE) 25 MG tablet Take 1 tablet (25 mg total) by mouth daily.   sucralfate (CARAFATE) 1 g tablet Take 1 g by mouth daily as needed.   Tiotropium Bromide Monohydrate (SPIRIVA RESPIMAT) 2.5 MCG/ACT AERS Inhale 2 puffs into the lungs daily.   traMADol (ULTRAM) 50 MG tablet Take 1 tablet (50 mg total) by mouth 2 (two) times daily.   [DISCONTINUED] BREO ELLIPTA 100-25 MCG/ACT AEPB Inhale 1 puff into the lungs daily.            Past Medical History:  Diagnosis Date   Arthritis    psoriatic arthritis and osteo arthritis   Asthma    Cancer (HCC)    Prostate   Carotid artery stenosis    a.s/p L CEA in 01/2020   Collapse of lung    Colon polyps    DOE (dyspnea on exertion) 12/02/2018   Eczema    GERD (gastroesophageal reflux disease)    Hypertension    Seasonal allergies    Stroke [facility])    Vertigo       Objective:    Wt Readings from Last 3 Encounters:  12/21/22 191 lb (86.6 kg)  12/01/22 189 lb (85.7 kg)  11/23/22 193 lb 9.6 oz (87.8 kg)      Vital signs reviewed  12/21/2022  - Note at rest 02 sats  93% on RA   General appearance:    pleasant wm, very slow ambulation/ dry cough      HEENT : Oropharynx  clear   Nasal  turbinates nl    NECK :  without  apparent JVD/ palpable Nodes/TM    LUNGS: no acc muscle use,  Min barrel  contour chest wall with bilateral  slightly decreased bs s audible wheeze and  without cough on insp or exp maneuvers and min  Hyperresonant  to  percussion bilaterally    CV:  RRR  no s3 or murmur or increase in P2, and no edema   ABD:  quite obese soft and nontender    MS:   ext warm without deformities Or obvious joint restrictions  calf tenderness, cyanosis or clubbing     SKIN: warm and dry without lesions    NEURO:  alert, approp, nl sensorium with  no motor or cerebellar deficits apparent.              Assessment

## 2022-12-21 ENCOUNTER — Ambulatory Visit: Payer: Medicare PPO | Admitting: Internal Medicine

## 2022-12-21 ENCOUNTER — Encounter: Payer: Self-pay | Admitting: Internal Medicine

## 2022-12-21 VITALS — BP 134/62 | HR 74 | Ht 67.0 in | Wt 191.0 lb

## 2022-12-21 DIAGNOSIS — J449 Chronic obstructive pulmonary disease, unspecified: Secondary | ICD-10-CM | POA: Diagnosis not present

## 2022-12-21 NOTE — Assessment & Plan Note (Addendum)
Quit smoking 1983 with h/o ? Severe asthma as child only rx saba  - Allergy screen 08/29/22 >  Eos 0.5   - 11/23/2022  d/c breo due to cough/ choking spells   - 11/23/2022  After extensive coaching inhaler device,  effectiveness =   60% with smi > try spiriva samples and max gerd rx and depomedrol 120 mg IM > minimally better 12/21/2022 but did not try mucinex dm  >>> 12/21/2022 added flutter valve/ max mucinex dm/ changed to stiolto on trial basis one q am x one week then 2 each am  Symptoms are  disproportionate to objective findings and not clear to what extent this is actually a pulmonary  problem but pt does appear to have difficult to sort out respiratory symptoms of unknown origin for which  DDX  = almost all start with A and  include Adherence, Ace Inhibitors, Acid Reflux, Active Sinus Disease, Alpha 1 Antitripsin deficiency, Anxiety masquerading as Airways dz,  ABPA,  Allergy(esp in young), Aspiration (esp in elderly), Adverse effects of meds,  Active smoking or Vaping, A bunch of PE's/clot burden (a few small clots can't cause this syndrome unless there is already severe underlying pulm or vascular dz with poor reserve),  Anemia or thyroid disorder, plus two Bs  = Bronchiectasis and Beta blocker use..and one C= CHF    Adherence is always the initial "prime suspect" and is a multilayered concern that requires a "trust but verify" approach in every patient - starting with knowing how to use medications, especially inhalers, correctly, keeping up with refills and understanding the fundamental difference between maintenance and prns vs those medications only taken for a very short course and then stopped and not refilled.  - 12/21/2022  After extensive coaching inhaler device,  effectiveness =    75%(delayed inpiration post trigger) - try again to return  with all meds in hand using a trust but verify approach to confirm accurate Medication  Reconciliation The principal here is that until we are  certain that the  patients are doing what we've asked, it makes no sense to ask them to do more.   ? Acid (or non-acid) GERD > always difficult to exclude as up to 75% of pts in some series report no assoc GI/ Heartburn symptoms> rec max (24h)  acid suppression and diet restrictions/ reviewed and instructions given in writing >>> f/u gi planned, bed blocks strongly encouraged plus wt loss   ? Aspiration > GI f/u pending           Each maintenance medication was reviewed in detail including emphasizing most importantly the difference between maintenance and prns and under what circumstances the prns are to be triggered using an action plan format where appropriate.  Total time for H and P, chart review, counseling, reviewing smi/ flutter  device(s) and generating customized AVS unique to this office visit / same day charting  > 40 min for multiple  refractory respiratory  symptoms of uncertain etiology

## 2022-12-21 NOTE — Patient Instructions (Signed)
Start with stiolto one pfff a day for a week the 2 puffs a day and if not better go with spiriva   For cough/ congestion >  mucinex dm  up to maximum of  1200 mg every 12 hours and use the flutter valve as much as you can    Please schedule a follow up visit in 6 months but call sooner if needed - bring medications

## 2022-12-25 ENCOUNTER — Telehealth: Payer: Self-pay | Admitting: Family Medicine

## 2022-12-25 ENCOUNTER — Telehealth: Payer: Self-pay | Admitting: Nurse Practitioner

## 2022-12-25 NOTE — Telephone Encounter (Signed)
Copied from CRM [id number]. Topic: General - Other >> Dec 25, 2022  3:51 PM Larwance Sachs wrote: Reason for CRM: Jomarie Longs called in regarding a fax sent to Urology office, states he needs clarification regarding what is going on with patient and fax was a little confusing   Left call back number as [phone] and voicemails are okay

## 2022-12-25 NOTE — Telephone Encounter (Signed)
Spoke with patient's daughter and advised that lab work that was completed on 12/01/22 has been faxed to urologists office.  She is asking if you will order a PSA level and let patient come in sometime this week to have that drawn and forward that result to the urology office so that they will this number when he has his appointment.  Please advise.

## 2022-12-25 NOTE — Telephone Encounter (Signed)
Copied from CRM [id number]. Topic: Clinical - Lab/Test Results >> Dec 25, 2022  1:41 PM Prudencio Pair wrote: Reason for CRM: Patient's daughter, Silva Bandy, is needing lab results sent to [facility] for her dad. She states he has an appt for this coming Wednesday and would like for them to be sent ASAP. Please give patient's daughter a call back at [phone].

## 2022-12-26 ENCOUNTER — Other Ambulatory Visit: Payer: Self-pay

## 2022-12-26 DIAGNOSIS — R972 Elevated prostate specific antigen [PSA]: Secondary | ICD-10-CM

## 2022-12-26 NOTE — Telephone Encounter (Signed)
Patient had PSA done in May 2024 and insurance will only pay for it 1x a year

## 2022-12-26 NOTE — Telephone Encounter (Signed)
Spoke with daughter. Future order placed for PSA and daughter notified they can stop by the office and have it drawn

## 2022-12-27 ENCOUNTER — Other Ambulatory Visit: Payer: Medicare PPO

## 2022-12-27 DIAGNOSIS — R972 Elevated prostate specific antigen [PSA]: Secondary | ICD-10-CM

## 2022-12-28 DIAGNOSIS — J449 Chronic obstructive pulmonary disease, unspecified: Secondary | ICD-10-CM | POA: Diagnosis not present

## 2022-12-28 LAB — PSA, TOTAL AND FREE
PSA, Free Pct: <20 %
PSA, Free: <0.02 ng/mL
Prostate Specific Ag, Serum: 0.1 ng/mL (ref 0.0–4.0)

## 2023-01-11 DIAGNOSIS — N400 Enlarged prostate without lower urinary tract symptoms: Secondary | ICD-10-CM | POA: Diagnosis not present

## 2023-01-11 DIAGNOSIS — C61 Malignant neoplasm of prostate: Secondary | ICD-10-CM | POA: Diagnosis not present

## 2023-01-12 ENCOUNTER — Telehealth: Payer: Self-pay | Admitting: Cardiology

## 2023-01-12 NOTE — Telephone Encounter (Signed)
 Pt c/o BP issue: STAT if pt c/o blurred vision, one-sided weakness or slurred speech  1. What are your last 5 BP readings? 181/80; 59;   2. Are you having any other symptoms (ex. Dizziness, headache, blurred vision, passed out)? Swelling in ankles, especially left ankle  3. What is your BP issue? BP has been slowly creeping up since Christmas

## 2023-01-12 NOTE — Telephone Encounter (Signed)
 Monday-180/74 HR 62 Tuesday-173/77 HR 63 Wednesday-170/80  HR 59  Thursday-175/82  HR 57 Today-177/80 HR 59 Medications? All medications are up to date metformin  was most recently increased  Symptoms? Swelling in feet (no weight gain)  No syncope or SOB or CP patient has recently seen the pulmonologist and is doing well. Overall patient is well his BP has just been elevated.  Patient electric BP machine is 80 years old I advised patient it may be a good idea to update monitor and keep monitoring BP

## 2023-01-15 ENCOUNTER — Ambulatory Visit: Payer: Self-pay | Admitting: Nurse Practitioner

## 2023-01-15 DIAGNOSIS — E119 Type 2 diabetes mellitus without complications: Secondary | ICD-10-CM

## 2023-01-15 MED ORDER — METFORMIN HCL 1000 MG PO TABS: 500.0000 mg | ORAL_TABLET | Freq: Two times a day (BID) | ORAL | Status: DC

## 2023-01-15 MED ORDER — SITAGLIPTIN PHOSPHATE 50 MG PO TABS: 50.0000 mg | ORAL_TABLET | Freq: Every day | ORAL | 0 refills | Status: DC

## 2023-01-15 NOTE — Telephone Encounter (Signed)
 Chief Complaint: Diarrhea Symptoms: diarrhea since increase of Metformin  in November Frequency: Since November 2024 Pertinent Negatives: Patient denies fever Disposition: [] ED /[] Urgent Care (no appt availability in office) / [] Appointment(In office/virtual)/ []  [facility]/ [] Home Care/ [x] Refused Recommended Disposition /[] [facility]/ []  Follow-up with PCP Additional Notes: patient's daughter calling in with report of patient have several bouts of diarrhea after his evening dose of Metformin . Daughter states that her father is having accidents at night. Patient is eating and drinking but daughter states food intake has decreased. Per protocol, the recommendation is for an office visit. Daughter is refusing office visit at this time and would like a phone call from PCP staff. Daughter is questioning about Metformin  dose and if his stool softener needs to be discontinued. Daughter states that she will bring her father in if need be but would like to speak with PCP first. Care Advise given to daughter. Daughter verbalizes understanding and will await a call from staff.    Reason for Disposition  [1] SEVERE diarrhea (e.g., 7 or more times / day more than normal) AND [2] age > 60 years  Answer Assessment - Initial Assessment Questions 1. DIARRHEA SEVERITY: How bad is the diarrhea? How many more stools have you had in the past 24 hours than normal?    - NO DIARRHEA (SCALE 0)   - MILD (SCALE 1-3): Few loose or mushy BMs; increase of 1-3 stools over normal daily number of stools; mild increase in ostomy output.   -  MODERATE (SCALE 4-7): Increase of 4-6 stools daily over normal; moderate increase in ostomy output.   -  SEVERE (SCALE 8-10; OR WORST POSSIBLE): Increase of 7 or more stools daily over normal; moderate increase in ostomy output; incontinence.     Moderate-after second dose of Metformin  2. ONSET: When did the diarrhea begin?      Metformin  was increased  12/01/2022 and two days later started having diarrhea 3. BM CONSISTENCY: How loose or watery is the diarrhea?      Watery diarrhea 4. VOMITING: Are you also vomiting? If Yes, ask: How many times in the past 24 hours?      No 5. ABDOMEN PAIN: Are you having any abdomen pain? If Yes, ask: What does it feel like? (e.g., crampy, dull, intermittent, constant)      Crampy pain 6. ABDOMEN PAIN SEVERITY: If present, ask: How bad is the pain?  (e.g., Scale 1-10; mild, moderate, or severe)   - MILD (1-3): doesn't interfere with normal activities, abdomen soft and not tender to touch    - MODERATE (4-7): interferes with normal activities or awakens from sleep, abdomen tender to touch    - SEVERE (8-10): excruciating pain, doubled over, unable to do any normal activities       Mild 7. ORAL INTAKE: If vomiting, Have you been able to drink liquids? How much liquids have you had in the past 24 hours?     Drinks water throughout the day and 2 cups of coffee in the morning 8. HYDRATION: Any signs of dehydration? (e.g., dry mouth [not just dry lips], too weak to stand, dizziness, new weight loss) When did you last urinate?     No 9. EXPOSURE: Have you traveled to a foreign country recently? Have you been exposed to anyone with diarrhea? Could you have eaten any food that was spoiled?     NO 10. ANTIBIOTIC USE: Are you taking antibiotics now or have you taken antibiotics in the  past 2 months?       No 11. OTHER SYMPTOMS: Do you have any other symptoms? (e.g., fever, blood in stool)       No  Protocols used: Diarrhea-A-AH

## 2023-01-15 NOTE — Telephone Encounter (Signed)
 Patients daughter aware

## 2023-01-15 NOTE — Telephone Encounter (Signed)
 Change metformin  to 1/2 tablet 2x a day. Added januvia  50mg  1 po daily- prescription sent to pharmacy Strict carb counting Keep follow up appointment  Meds ordered this encounter  Medications   metFORMIN  (GLUCOPHAGE ) 1000 MG tablet    Sig: Take 0.5 tablets (500 mg total) by mouth 2 (two) times daily with a meal.    Supervising Provider:   MARYANNE CHEW A [[id number]]   sitaGLIPtin  (JANUVIA ) 50 MG tablet    Sig: Take 1 tablet (50 mg total) by mouth daily.    Dispense:  90 tablet    Refill:  0    Supervising Provider:   MARYANNE CHEW A [[id number]]   Mary-Margaret Gladis, FNP

## 2023-01-15 NOTE — Addendum Note (Signed)
 Addended by: Bennie Pierini on: 01/15/2023 02:47 PM   Modules accepted: Orders

## 2023-01-16 ENCOUNTER — Telehealth: Payer: Self-pay | Admitting: Internal Medicine

## 2023-01-16 NOTE — Telephone Encounter (Signed)
 BP remaining too high, please increase hydralazine to 100mg  bid. Nursing visit for bp check and bring home cuff 1 week  Dominga Ferry MD

## 2023-01-16 NOTE — Telephone Encounter (Signed)
 Patient is almost out of his Spiriva Respimat--the pharmacy needs an updated prescription for the" new one" (per daughter--Laura [facility] D[facility])  [facility] --[phone] --Laura's  call back --[phone]

## 2023-01-17 ENCOUNTER — Other Ambulatory Visit: Payer: Self-pay | Admitting: Nurse Practitioner

## 2023-01-17 ENCOUNTER — Telehealth: Payer: Self-pay | Admitting: Internal Medicine

## 2023-01-17 NOTE — Telephone Encounter (Signed)
 Pt needs a script sent in for [employer] not Spiriva  Pharmacy: [facility] [place], [place] - [address]

## 2023-01-17 NOTE — Telephone Encounter (Signed)
 Copied from CRM [id number]. Topic: Clinical - Medication Refill >> Jan 17, 2023 10:58 AM Chase BROCKS wrote: Most Recent Primary Care Visit:  Provider: [facility]  Department: [facility]  Visit Type: LAB VISIT  Date: 12/27/2022  Medication: sitaGLIPtin  (JANUVIA ) 50 MG tablet  Has the patient contacted their pharmacy? Yes (Agent: If no, request that the patient contact the pharmacy for the refill. If patient does not wish to contact the pharmacy document the reason why and proceed with request.) (Agent: If yes, when and what did the pharmacy advise?)  Is this the correct pharmacy for this prescription? Yes If no, delete pharmacy and type the correct one.  This is the patient's preferred pharmacy:  [facility] [place], [place] - [address] [place] [address] Phone: [phone] Fax: [phone]   Has the prescription been filled recently? Yes  Is the patient out of the medication? Yes  Has the patient been seen for an appointment in the last year OR does the patient have an upcoming appointment? Yes  Can we respond through MyChart? Yes  Agent: Please be advised that Rx refills may take up to 3 business days. We ask that you follow-up with your pharmacy.

## 2023-01-17 NOTE — Telephone Encounter (Signed)
 Per pharmacy patient has 5 refills available. They will get one ready for patient.    LVM to advise Vernona Rieger to contact pharmacy.

## 2023-01-18 ENCOUNTER — Ambulatory Visit: Payer: Self-pay | Admitting: Family Medicine

## 2023-01-18 MED ORDER — SITAGLIPTIN PHOSPHATE 50 MG PO TABS: 50.0000 mg | ORAL_TABLET | Freq: Every day | ORAL | 1 refills | Status: DC

## 2023-01-18 NOTE — Telephone Encounter (Signed)
 Closing encounter, phoned in on 01/15/23

## 2023-01-18 NOTE — Telephone Encounter (Signed)
 Rx resent to [facility]. Patients daughter notified and verbalized understanding

## 2023-01-18 NOTE — Telephone Encounter (Signed)
 Message from [place] C sent at 01/17/2023 11:02 AM EST  Patient daughter Tully called in to have the update of medication sitaGLIPtin  (JANUVIA ) 50 MG tablet called in to the [facility]. Sent over the prescription to be approved. Can you please give her a call at [phone]. Thank you.

## 2023-01-18 NOTE — Telephone Encounter (Signed)
 Patient's daughter is returning CMA's call.   Please advise.

## 2023-01-18 NOTE — Telephone Encounter (Signed)
 Pt needs a script sent in for Stiolto not Spiriva

## 2023-01-18 NOTE — Telephone Encounter (Signed)
 Left message to call back

## 2023-01-18 NOTE — Telephone Encounter (Signed)
 Pharmacy calling for a Stiolto not Spiriva prescription

## 2023-01-19 MED ORDER — HYDRALAZINE HCL 100 MG PO TABS: 100.0000 mg | ORAL_TABLET | Freq: Two times a day (BID) | ORAL | 1 refills | Status: DC

## 2023-01-19 NOTE — Telephone Encounter (Signed)
 Patient informed and verbalized understanding of plan. Please schedule 1 week nurse visit for BP check per JB

## 2023-01-19 NOTE — Telephone Encounter (Signed)
 Pt daughter calling in again for Stiolto not Spiriva prescription. Pt no longer has any more medication

## 2023-01-19 NOTE — Telephone Encounter (Signed)
Follow Up:     Daughter is returning a call from yesterday.

## 2023-01-22 MED ORDER — TIOTROPIUM BROMIDE-OLODATEROL 2.5-2.5 MCG/ACT IN AERS: 2.0000 | INHALATION_SPRAY | Freq: Every day | RESPIRATORY_TRACT | 5 refills | Status: DC

## 2023-01-22 NOTE — Telephone Encounter (Signed)
 Per LOV 12/21/22: Start with stiolto one pfff a day for a week the 2 puffs a day and if not better go with spiriva   Patient requesting to continue Stiolto.  Rx sent to pharmacy.

## 2023-01-25 ENCOUNTER — Ambulatory Visit: Payer: Medicare PPO | Attending: Internal Medicine | Admitting: *Deleted

## 2023-01-26 MED ORDER — HYDRALAZINE HCL 100 MG PO TABS: 100.0000 mg | ORAL_TABLET | Freq: Three times a day (TID) | ORAL | 1 refills | Status: DC

## 2023-01-26 NOTE — Progress Notes (Signed)
BP still to high, could he increase the hydralazine to 100mg  tid. I know not easy to take a medication 3 times daily but all things considered would be the easiest change as opposed to adding another new medication at this time  Dominga Ferry MD

## 2023-01-26 NOTE — Progress Notes (Signed)
Daughter Corliss Parish) notified & verbalized understanding.  New prescription sent to [facility] now.   She will update [place] on BP readings in about 7-10 days.

## 2023-01-26 NOTE — Progress Notes (Signed)
Patient and daughter Rodney Hernandez) in office yesterday morning for nurse BP check.   Right - 158/86  61  96% Left - 158/80    His monitor - 173/78  62   States his BP reading at home prior to nurse visit was 145/61.    Will need refill on Hydralazine if continue the same dose.    Please call Vernona Rieger with instructions:  [phone].  She states she will be ordering the [employer] BP monitor from [facility] since there is such a large difference between the 2 readings.    Reviewed proper technique on taking BP readings.  She verbalized understanding.

## 2023-02-26 ENCOUNTER — Other Ambulatory Visit: Payer: Self-pay | Admitting: Nurse Practitioner

## 2023-02-26 DIAGNOSIS — L4050 Arthropathic psoriasis, unspecified: Secondary | ICD-10-CM

## 2023-02-27 ENCOUNTER — Ambulatory Visit: Payer: Medicare PPO | Admitting: Nurse Practitioner

## 2023-02-27 ENCOUNTER — Other Ambulatory Visit: Payer: Medicare PPO

## 2023-02-27 ENCOUNTER — Encounter: Payer: Self-pay | Admitting: Nurse Practitioner

## 2023-02-27 VITALS — BP 149/60 | HR 78 | Temp 97.6°F | Wt 188.0 lb

## 2023-02-27 DIAGNOSIS — E119 Type 2 diabetes mellitus without complications: Secondary | ICD-10-CM

## 2023-02-27 DIAGNOSIS — Z8673 Personal history of transient ischemic attack (TIA), and cerebral infarction without residual deficits: Secondary | ICD-10-CM | POA: Diagnosis not present

## 2023-02-27 DIAGNOSIS — L4050 Arthropathic psoriasis, unspecified: Secondary | ICD-10-CM

## 2023-02-27 DIAGNOSIS — Z7984 Long term (current) use of oral hypoglycemic drugs: Secondary | ICD-10-CM

## 2023-02-27 DIAGNOSIS — E1169 Type 2 diabetes mellitus with other specified complication: Secondary | ICD-10-CM | POA: Diagnosis not present

## 2023-02-27 DIAGNOSIS — I10 Essential (primary) hypertension: Secondary | ICD-10-CM

## 2023-02-27 DIAGNOSIS — I63239 Cerebral infarction due to unspecified occlusion or stenosis of unspecified carotid arteries: Secondary | ICD-10-CM | POA: Diagnosis not present

## 2023-02-27 DIAGNOSIS — R600 Localized edema: Secondary | ICD-10-CM

## 2023-02-27 DIAGNOSIS — J449 Chronic obstructive pulmonary disease, unspecified: Secondary | ICD-10-CM

## 2023-02-27 DIAGNOSIS — I700 Atherosclerosis of aorta: Secondary | ICD-10-CM | POA: Diagnosis not present

## 2023-02-27 DIAGNOSIS — C61 Malignant neoplasm of prostate: Secondary | ICD-10-CM | POA: Diagnosis not present

## 2023-02-27 DIAGNOSIS — M7661 Achilles tendinitis, right leg: Secondary | ICD-10-CM

## 2023-02-27 DIAGNOSIS — N401 Enlarged prostate with lower urinary tract symptoms: Secondary | ICD-10-CM

## 2023-02-27 DIAGNOSIS — D509 Iron deficiency anemia, unspecified: Secondary | ICD-10-CM | POA: Diagnosis not present

## 2023-02-27 DIAGNOSIS — E785 Hyperlipidemia, unspecified: Secondary | ICD-10-CM

## 2023-02-27 DIAGNOSIS — K219 Gastro-esophageal reflux disease without esophagitis: Secondary | ICD-10-CM

## 2023-02-27 DIAGNOSIS — F321 Major depressive disorder, single episode, moderate: Secondary | ICD-10-CM

## 2023-02-27 LAB — LIPID PANEL

## 2023-02-27 LAB — BAYER DCA HB A1C WAIVED: HB A1C (BAYER DCA - WAIVED): 6.6 % — ABNORMAL HIGH (ref 4.8–5.6)

## 2023-02-27 MED ORDER — POLYSACCHARIDE IRON COMPLEX 150 MG PO CAPS: ORAL_CAPSULE | ORAL | 1 refills | Status: DC

## 2023-02-27 MED ORDER — ESCITALOPRAM OXALATE 20 MG PO TABS: 20.0000 mg | ORAL_TABLET | Freq: Every day | ORAL | 1 refills | Status: DC

## 2023-02-27 MED ORDER — METOPROLOL SUCCINATE ER 25 MG PO TB24: 12.5000 mg | ORAL_TABLET | Freq: Every day | ORAL | 1 refills | Status: DC

## 2023-02-27 MED ORDER — SITAGLIPTIN PHOSPHATE 50 MG PO TABS: 50.0000 mg | ORAL_TABLET | Freq: Every day | ORAL | 1 refills | Status: DC

## 2023-02-27 MED ORDER — PANTOPRAZOLE SODIUM 40 MG PO TBEC: 40.0000 mg | DELAYED_RELEASE_TABLET | Freq: Two times a day (BID) | ORAL | 1 refills | Status: DC

## 2023-02-27 MED ORDER — ATORVASTATIN CALCIUM 40 MG PO TABS: 40.0000 mg | ORAL_TABLET | Freq: Every day | ORAL | 1 refills | Status: DC

## 2023-02-27 MED ORDER — TRAMADOL HCL 50 MG PO TABS
50.0000 mg | ORAL_TABLET | Freq: Two times a day (BID) | ORAL | 2 refills | Status: DC
Start: 2023-02-27 — End: 2023-06-18

## 2023-02-27 MED ORDER — CLOPIDOGREL BISULFATE 75 MG PO TABS: 75.0000 mg | ORAL_TABLET | Freq: Every day | ORAL | 1 refills | Status: DC

## 2023-02-27 MED ORDER — OLMESARTAN MEDOXOMIL 40 MG PO TABS: 40.0000 mg | ORAL_TABLET | Freq: Every day | ORAL | 1 refills | Status: DC

## 2023-02-27 MED ORDER — QUETIAPINE FUMARATE 25 MG PO TABS: 25.0000 mg | ORAL_TABLET | Freq: Every day | ORAL | 2 refills | Status: DC

## 2023-02-27 MED ORDER — METFORMIN HCL 1000 MG PO TABS: 1000.0000 mg | ORAL_TABLET | Freq: Two times a day (BID) | ORAL | 1 refills | Status: DC

## 2023-02-27 NOTE — Progress Notes (Signed)
 That'  Subjective:    Patient ID: Rodney Hernandez, male    DOB: 28-Aug-1945, 78 y.o.   MRN: [id number]  Chief Complaint: medical management of chronic issues     HPI:  Rodney Hernandez is a 78 y.o. who identifies as a male who was assigned male at birth.   Social history: Lives with: by hisself- his daughters check on him daily Work history: retired   Water engineer in today for follow up of the following chronic medical issues:  1. Essential hypertension No c/o chest pain, sob or headache. Does check blood pressure at home. Blood pressure has been running around 150's systolic. BP Readings from Last 3 Encounters:  01/26/23 (!) 158/80  12/21/22 134/62  12/01/22 138/72     2. Carotid stenosis, symptomatic, with infarction [facility]) Patient states that had doppler done in oct 2023 and was only 40% occluded. He denies any dizziness  3. COPD  GOLD 2 Patient saw DR. Wert 11/23/22. They stopped his BREO and started him on spirivia. That has really helped with his cough.  4. Diabetes mellitus treated with oral medication (HCC) Fasting blood sugars are running around 140-170. He does not check it every day. Increased metformin to 1000mg  bid at last visit. Started having diarrhea on higher dose so we changed him to metformin 500mg  and [place] 100mg  daily. Family said that they never got [place]. Lab Results  Component Value Date   HGBA1C 8.0 (H) 12/01/2022     5. Hyperlipidemia associated with type 2 diabetes mellitus (HCC) Does not watch diet very closely and is mot as active as he use to be. Lab Results  Component Value Date   CHOL 114 12/01/2022   HDL 42 12/01/2022   LDLCALC 45 12/01/2022   TRIG 162 (H) 12/01/2022   CHOLHDL 2.7 12/01/2022     6. Iron deficiency anemia, unspecified iron deficiency anemia type No c/o fatigue Lab Results  Component Value Date   HGB 13.6 12/01/2022     7. History of  cerebrovascular accident (CVA) No permanent effects  8. Depression, major, single  episode, moderate (HCC) Is on lexapro and is doing well    12/01/2022    2:10 PM 08/29/2022    2:45 PM 06/26/2022   12:08 PM  Depression screen PHQ 2/9  Decreased Interest 1 0 0  Down, Depressed, Hopeless 1 0 0  PHQ - 2 Score 2 0 0  Altered sleeping 3 2 0  Tired, decreased energy 3 1 0  Change in appetite 2 1 0  Feeling bad or failure about yourself  1 0 0  Trouble concentrating 2 3 0  Moving slowly or fidgety/restless 0 0 0  Suicidal thoughts 0 0 0  PHQ-9 Score 13 7 0  Difficult doing work/chores Somewhat difficult Not difficult at all Not difficult at all     9. Peripheral edema Has occasional swelling of lower ext. Has been beter  10. Benign prostatic hyperplasia with lower urinary tract symptoms, symptom details unspecified No recent voiding issues Lab Results  Component Value Date   PSA1 0.1 12/27/2022   PSA1 0.3 05/15/2022   PSA1 0.8 11/11/2021       New complaints: Left achilles tendon is swelling and making him of balance  Allergies  Allergen Reactions   Nsaids Shortness Of Breath   Aspirin Other (See Comments)    Affects patient breathing   Morphine And Codeine Hives and Itching   Sulfa Antibiotics    Outpatient Encounter Medications as of 02/27/2023  Medication Sig   Accu-Chek Softclix Lancets lancets Test BS up to QID Dx E11.9   albuterol (VENTOLIN HFA) 108 (90 Base) MCG/ACT inhaler TAKE 2 PUFFS BY MOUTH EVERY 6 HOURS AS NEEDED FOR WHEEZE OR SHORTNESS OF BREATH   aspirin 81 MG chewable tablet Chew 81 mg by mouth daily.   atorvastatin (LIPITOR) 40 MG tablet Take 1 tablet (40 mg total) by mouth daily.   blood glucose meter kit and supplies KIT Dispense based on patient and insurance preference. Use up to four times daily as directed. (FOR ICD-9 250.00, 250.01).   cholecalciferol (VITAMIN D3) 25 MCG (1000 UNIT) tablet Take 1 tablet (1,000 Units total) by mouth daily.   clopidogrel (PLAVIX) 75 MG tablet Take 1 tablet (75 mg total) by mouth daily.    escitalopram (LEXAPRO) 20 MG tablet Take 1 tablet (20 mg total) by mouth daily.   glucose blood (ACCU-CHEK GUIDE) test strip Test BS up to QID Dx E11.9   hydrALAZINE (APRESOLINE) 100 MG tablet Take 1 tablet (100 mg total) by mouth 3 (three) times daily.   iron polysaccharides (NIFEREX) 150 MG capsule TAKE 1 CAPSULE BY MOUTH EVERY DAY   Leuprolide Acetate, 6 Month, (LUPRON) 45 MG injection Inject 45 mg into the muscle every 6 (six) months.   metFORMIN (GLUCOPHAGE) 1000 MG tablet Take 0.5 tablets (500 mg total) by mouth 2 (two) times daily with a meal.   metoprolol succinate (TOPROL-XL) 25 MG 24 hr tablet Take 0.5 tablets (12.5 mg total) by mouth daily.   olmesartan (BENICAR) 40 MG tablet Take 1 tablet (40 mg total) by mouth daily.   pantoprazole (PROTONIX) 40 MG tablet Take 1 tablet (40 mg total) by mouth 2 (two) times daily.   sitaGLIPtin (JANUVIA) 50 MG tablet Take 1 tablet (50 mg total) by mouth daily.   spironolactone (ALDACTONE) 25 MG tablet Take 1 tablet (25 mg total) by mouth daily.   sucralfate (CARAFATE) 1 g tablet Take 1 g by mouth daily as needed.   Tiotropium Bromide-Olodaterol 2.5-2.5 MCG/ACT AERS Inhale 2 puffs into the lungs daily.   traMADol (ULTRAM) 50 MG tablet Take 1 tablet (50 mg total) by mouth 2 (two) times daily.   No facility-administered encounter medications on file as of 02/27/2023.    Past Surgical History:  Procedure Laterality Date   collapsed lung     HERNIA REPAIR     PROSTATE SURGERY      Family History  Problem Relation Age of Onset   Lung cancer Father    Prostate cancer Father    Stomach cancer Maternal Grandmother    Prostate cancer Paternal Uncle    Cervical cancer Daughter    Psoriasis Daughter    Arthritis Daughter        psoriatic arthritis      Controlled substance contract: n/a     Review of Systems  Constitutional:  Negative for diaphoresis.  Eyes:  Negative for pain.  Respiratory:  Negative for shortness of breath.    Cardiovascular:  Negative for chest pain, palpitations and leg swelling.  Gastrointestinal:  Negative for abdominal pain.  Endocrine: Negative for polydipsia.  Skin:  Negative for rash.  Neurological:  Negative for dizziness, weakness and headaches.  Hematological:  Does not bruise/bleed easily.  All other systems reviewed and are negative.      Objective:   Physical Exam Vitals and nursing note reviewed.  Constitutional:      Appearance: Normal appearance. He is well-developed.  HENT:  Head: Normocephalic.     Nose: Nose normal.     Mouth/Throat:     Mouth: Mucous membranes are moist.     Pharynx: Oropharynx is clear.  Eyes:     Pupils: Pupils are equal, round, and reactive to light.  Neck:     Thyroid: No thyroid mass or thyromegaly.     Vascular: No carotid bruit or JVD.     Trachea: Phonation normal.  Cardiovascular:     Rate and Rhythm: Normal rate and regular rhythm.  Pulmonary:     Effort: Pulmonary effort is normal. No respiratory distress.     Breath sounds: Normal breath sounds.  Abdominal:     General: Bowel sounds are normal.     Palpations: Abdomen is soft.     Tenderness: There is no abdominal tenderness.  Musculoskeletal:        General: Normal range of motion.     Cervical back: Normal range of motion and neck supple.     Right lower leg: Edema (1+) present.     Left lower leg: Edema (1+) present.     Comments: Pain on palpation right achilles tendon Pain on flexion and extension of right foot  Lymphadenopathy:     Cervical: No cervical adenopathy.  Skin:    General: Skin is warm and dry.  Neurological:     Mental Status: He is alert and oriented to person, place, and time.  Psychiatric:        Behavior: Behavior normal.        Thought Content: Thought content normal.        Judgment: Judgment normal.     BP (!) 149/60   Pulse 78   Temp 97.6 F (36.4 C)   Wt 188 lb (85.3 kg)   SpO2 93%   BMI 29.44 kg/m     Hgbac1 6.6%      Assessment & Plan:  Rodney Hernandez comes in today with chief complaint of Medical Management of Chronic Issues (Pts  tendon on right foot has been hurting/swelling for 3 months, tired ice/heat and nothing helps. /Pt would like to talk about making changes to his depression medication./Pt has started mixing up his medication, pt took 2 metforman at one time and it made him very sick./Pt's JANUVIA was never sent in to the pharmacy.)   Diagnosis and orders addressed:  1. Essential hypertension (Primary) Low sodium diet - CMP14+EGFR - olmesartan (BENICAR) 40 MG tablet; Take 1 tablet (40 mg total) by mouth daily.  Dispense: 90 tablet; Refill: 1  2. Carotid stenosis, symptomatic, with infarction [facility]) Will repeat scan next year  3. COPD  GOLD 2 Keep follow up with pulmonology  4. Diabetes mellitus treated with oral medication (HCC) Hold januvia for now Continue to watch blood sugars - Bayer DCA Hb A1c Waived - metFORMIN (GLUCOPHAGE) 1000 MG tablet; Take 1 tablet (1,000 mg total) by mouth 2 (two) times daily with a meal.  Dispense: 180 tablet; Refill: 1  5. Hyperlipidemia associated with type 2 diabetes mellitus (HCC) Low fta diet - Lipid panel - atorvastatin (LIPITOR) 40 MG tablet; Take 1 tablet (40 mg total) by mouth daily.  Dispense: 90 tablet; Refill: 1  6. Iron deficiency anemia, unspecified iron deficiency anemia type Continue iron supplements - CBC with Differential/Platelet - iron polysaccharides (NIFEREX) 150 MG capsule; TAKE 1 CAPSULE BY MOUTH EVERY DAY  Dispense: 90 capsule; Refill: 1  7. History of CVA (cerebrovascular accident) - clopidogrel (PLAVIX) 75 MG tablet; Take 1  tablet (75 mg total) by mouth daily.  Dispense: 90 tablet; Refill: 1  8. Depression, major, single episode, moderate (HCC) Stress management Added seroquel 25mg  qhs - escitalopram (LEXAPRO) 20 MG tablet; Take 1 tablet (20 mg total) by mouth daily.  Dispense: 90 tablet; Refill: 1  9. Peripheral  edema Elevate legs when sitting  10. Benign prostatic hyperplasia with lower urinary tract symptoms, symptom details unspecified Report any voiding issues  11. Aortic atherosclerosis (HCC) - metoprolol succinate (TOPROL-XL) 25 MG 24 hr tablet; Take 0.5 tablets (12.5 mg total) by mouth daily.  Dispense: 90 tablet; Refill: 1  12. Gastroesophageal reflux disease without esophagitis Avoid spicy foods Do not eat 2 hours prior to bedtime  - pantoprazole (PROTONIX) 40 MG tablet; Take 1 tablet (40 mg total) by mouth 2 (two) times daily.  Dispense: 180 tablet; Refill: 1  13. Achilles tendinitis of right lower extremity Wear achilles tendon brace Voltaren gel as needed.   Labs pending Health Maintenance reviewed Diet and exercise encouraged  Follow up plan: 3 months   Mary-Margaret Daphine Deutscher, FNP

## 2023-02-27 NOTE — Addendum Note (Signed)
 Addended by: Bennie Pierini on: 02/27/2023 02:02 PM   Modules accepted: Orders

## 2023-02-27 NOTE — Patient Instructions (Signed)
 Fall Prevention in the Home, Adult Falls can cause injuries and can happen to people of all ages. There are many things you can do to make your home safer and to help prevent falls. What actions can I take to prevent falls? General information Use good lighting in all rooms. Make sure to: Replace any light bulbs that burn out. Turn on the lights in dark areas and use night-lights. Keep items that you use often in easy-to-reach places. Lower the shelves around your home if needed. Move furniture so that there are clear paths around it. Do not use throw rugs or other things on the floor that can make you trip. If any of your floors are uneven, fix them. Add color or contrast paint or tape to clearly mark and help you see: Grab bars or handrails. First and last steps of staircases. Where the edge of each step is. If you use a ladder or stepladder: Make sure that it is fully opened. Do not climb a closed ladder. Make sure the sides of the ladder are locked in place. Have someone hold the ladder while you use it. Know where your pets are as you move through your home. What can I do in the bathroom?     Keep the floor dry. Clean up any water on the floor right away. Remove soap buildup in the bathtub or shower. Buildup makes bathtubs and showers slippery. Use non-skid mats or decals on the floor of the bathtub or shower. Attach bath mats securely with double-sided, non-slip rug tape. If you need to sit down in the shower, use a non-slip stool. Install grab bars by the toilet and in the bathtub and shower. Do not use towel bars as grab bars. What can I do in the bedroom? Make sure that you have a light by your bed that is easy to reach. Do not use any sheets or blankets on your bed that hang to the floor. Have a firm chair or bench with side arms that you can use for support when you get dressed. What can I do in the kitchen? Clean up any spills right away. If you need to reach something  above you, use a step stool with a grab bar. Keep electrical cords out of the way. Do not use floor polish or wax that makes floors slippery. What can I do with my stairs? Do not leave anything on the stairs. Make sure that you have a light switch at the top and the bottom of the stairs. Make sure that there are handrails on both sides of the stairs. Fix handrails that are broken or loose. Install non-slip stair treads on all your stairs if they do not have carpet. Avoid having throw rugs at the top or bottom of the stairs. Choose a carpet that does not hide the edge of the steps on the stairs. Make sure that the carpet is firmly attached to the stairs. Fix carpet that is loose or worn. What can I do on the outside of my home? Use bright outdoor lighting. Fix the edges of walkways and driveways and fix any cracks. Clear paths of anything that can make you trip, such as tools or rocks. Add color or contrast paint or tape to clearly mark and help you see anything that might make you trip as you walk through a door, such as a raised step or threshold. Trim any bushes or trees on paths to your home. Check to see if handrails are loose  or broken and that both sides of all steps have handrails. Install guardrails along the edges of any raised decks and porches. Have leaves, snow, or ice cleared regularly. Use sand, salt, or ice melter on paths if you live where there is ice and snow during the winter. Clean up any spills in your garage right away. This includes grease or oil spills. What other actions can I take? Review your medicines with your doctor. Some medicines can cause dizziness or changes in blood pressure, which increase your risk of falling. Wear shoes that: Have a low heel. Do not wear high heels. Have rubber bottoms and are closed at the toe. Feel good on your feet and fit well. Use tools that help you move around if needed. These include: Canes. Walkers. Scooters. Crutches. Ask  your doctor what else you can do to help prevent falls. This may include seeing a physical therapist to learn to do exercises to move better and get stronger. Where to find more information Centers for Disease Control and Prevention, STEAD[employer]: TonerPromos.no [employer] on Aging: BaseRingTones.pl National Institute on Aging: BaseRingTones.pl Contact a doctor if: You are afraid of falling at home. You feel weak, drowsy, or dizzy at home. You fall at home. Get help right away if you: Lose consciousness or have trouble moving after a fall. Have a fall that causes a head injury. These symptoms may be an emergency. Get help right away. Call 911. Do not wait to see if the symptoms will go away. Do not drive yourself to the hospital. This information is not intended to replace advice given to you by your health care provider. Make sure you discuss any questions you have with your health care provider. Document Revised: 08/29/2021 Document Reviewed: 08/29/2021 Elsevier Patient Education  2024 [employer].

## 2023-02-28 LAB — CBC WITH DIFFERENTIAL/PLATELET
Basophils Absolute: 0.1 10*3/uL (ref 0.0–0.2)
Basos: 1 %
EOS (ABSOLUTE): 0.3 10*3/uL (ref 0.0–0.4)
Eos: 2 %
Hematocrit: 38.9 % (ref 37.5–51.0)
Hemoglobin: 12.2 g/dL — ABNORMAL LOW (ref 13.0–17.7)
Immature Grans (Abs): 0.1 10*3/uL (ref 0.0–0.1)
Immature Granulocytes: 1 %
Lymphocytes Absolute: 1.7 10*3/uL (ref 0.7–3.1)
Lymphs: 15 %
MCH: 26.6 pg (ref 26.6–33.0)
MCHC: 31.4 g/dL — ABNORMAL LOW (ref 31.5–35.7)
MCV: 85 fL (ref 79–97)
Monocytes Absolute: 1.2 10*3/uL — ABNORMAL HIGH (ref 0.1–0.9)
Monocytes: 10 %
Neutrophils Absolute: 8.1 10*3/uL — ABNORMAL HIGH (ref 1.4–7.0)
Neutrophils: 71 %
Platelets: 360 10*3/uL (ref 150–450)
RBC: 4.59 x10E6/uL (ref 4.14–5.80)
RDW: 14 % (ref 11.6–15.4)
WBC: 11.4 10*3/uL — ABNORMAL HIGH (ref 3.4–10.8)

## 2023-02-28 LAB — CMP14+EGFR
ALT: 10 IU/L (ref 0–44)
AST: 13 IU/L (ref 0–40)
Albumin: 4.4 g/dL (ref 3.8–4.8)
Alkaline Phosphatase: 92 IU/L (ref 44–121)
BUN/Creatinine Ratio: 15 (ref 10–24)
BUN: 15 mg/dL (ref 8–27)
Bilirubin Total: 0.3 mg/dL (ref 0.0–1.2)
CO2: 24 mmol/L (ref 20–29)
Calcium: 11 mg/dL — ABNORMAL HIGH (ref 8.6–10.2)
Chloride: 102 mmol/L (ref 96–106)
Creatinine, Ser: 0.99 mg/dL (ref 0.76–1.27)
Globulin, Total: 2 g/dL (ref 1.5–4.5)
Glucose: 84 mg/dL (ref 70–99)
Potassium: 4.6 mmol/L (ref 3.5–5.2)
Sodium: 142 mmol/L (ref 134–144)
Total Protein: 6.4 g/dL (ref 6.0–8.5)
eGFR: 77 mL/min/{1.73_m2} (ref >59)

## 2023-02-28 LAB — LIPID PANEL
Cholesterol, Total: 95 mg/dL — ABNORMAL LOW (ref 100–199)
HDL: 37 mg/dL — ABNORMAL LOW (ref >39)
LDL CALC COMMENT:: 2.6 ratio (ref 0.0–5.0)
LDL Chol Calc (NIH): 37 mg/dL (ref 0–99)
Triglycerides: 114 mg/dL (ref 0–149)
VLDL Cholesterol Cal: 21 mg/dL (ref 5–40)

## 2023-03-07 DIAGNOSIS — L570 Actinic keratosis: Secondary | ICD-10-CM | POA: Diagnosis not present

## 2023-03-07 DIAGNOSIS — D485 Neoplasm of uncertain behavior of skin: Secondary | ICD-10-CM | POA: Diagnosis not present

## 2023-03-07 DIAGNOSIS — L98499 Non-pressure chronic ulcer of skin of other sites with unspecified severity: Secondary | ICD-10-CM | POA: Diagnosis not present

## 2023-03-12 DIAGNOSIS — N400 Enlarged prostate without lower urinary tract symptoms: Secondary | ICD-10-CM | POA: Diagnosis not present

## 2023-03-12 DIAGNOSIS — Z1330 Encounter for screening examination for mental health and behavioral disorders, unspecified: Secondary | ICD-10-CM | POA: Diagnosis not present

## 2023-03-12 DIAGNOSIS — C61 Malignant neoplasm of prostate: Secondary | ICD-10-CM | POA: Diagnosis not present

## 2023-04-23 ENCOUNTER — Encounter: Payer: Self-pay | Admitting: Cardiology

## 2023-04-23 ENCOUNTER — Ambulatory Visit: Payer: Medicare PPO | Attending: Cardiology | Admitting: Cardiology

## 2023-04-23 VITALS — BP 158/70 | HR 72 | Ht 67.5 in | Wt 182.2 lb

## 2023-04-23 DIAGNOSIS — E782 Mixed hyperlipidemia: Secondary | ICD-10-CM

## 2023-04-23 DIAGNOSIS — Z79899 Other long term (current) drug therapy: Secondary | ICD-10-CM

## 2023-04-23 DIAGNOSIS — I10 Essential (primary) hypertension: Secondary | ICD-10-CM

## 2023-04-23 DIAGNOSIS — I493 Ventricular premature depolarization: Secondary | ICD-10-CM

## 2023-04-23 MED ORDER — CHLORTHALIDONE 25 MG PO TABS: 12.5000 mg | ORAL_TABLET | ORAL | 1 refills | Status: DC

## 2023-04-23 NOTE — Patient Instructions (Signed)
 Medication Instructions:   Begin Chlorthalidone 12.5mg  every other day  Continue all other medications.     Labwork:  BMET, Mg - orders given today Please do in 2 weeks Office will contact with results via phone, letter or mychart.     Testing/Procedures:  none  Follow-Up:  6 months   Any Other Special Instructions Will Be Listed Below (If Applicable).  Please call the office with update on BP readings in 2 weeks.    If you need a refill on your cardiac medications before your next appointment, please call your pharmacy.

## 2023-04-23 NOTE — Progress Notes (Signed)
 Clinical Summary Rodney Hernandez is a 78 y.o.male seen today for follow up of the following medical problems.    1. SOB -previously reported some DOE with activity, example playing golf he gets significantly SOB. Used to tolerate quite well. Exertion is also limited by chronic leg pains as well.  - denies any chest pain. No LE edema. No orthopnea. No palpitations   CAD risk factors: HTN, Hyperlipidemia, former smoker x 20. Multiple family members on father's side with heart conditions.      08/2015 echo with LVEF 55-60%, normal diastolic function, mild MR, mild RV enlargement, PASP 42    11/2018 PFTs moderate obstruction 11/2018 echo LVEF 60-65%, no WMAs, normal RV,  11/2018 nuclear stress: no ischemia   - breathing has improved, compliant with inhalers       2. HTN - compliant with meds - low bps on hydrochlorothiazide, has been discontinued.  - started on norvasc 2 years ago, increased dose last year to 5mg  - some swelling in legs over last year, unclear if related to norvasc.  - home bp's 130s/60-70s   - compliant with meds - home bp 160s/60s-70s    3. COPD -2020 PFTs moderate obstruction     4. Hyperlipidemia -08/09/20 TC 97 TG 57 HDL 43 LDL 41 - 08/2022 TC 161 TG 096 HDL 43 LDL 50 - 02/2023 TC 95 TG 114 HDL 37 LDL 37     5. 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 NIHSS 7.  - CT @ OSH negative for hemorrhage. CTA head and neck brain with L MCA occlusion. - Pt candidate for IV tPA, so it was given and pt transferred to [facility] for thrombectomy. - Thrombectomy by Dr. Lanna Poche - L M2 embolus, TICI 3 flow post thrombectomy  - MRI 1/5 with scattered acute infarctions in the L cerebral hemisphere, tiny petechial hemorrhage in the L insula and putamen infarctions, mild leukoaraiosis, and tiny chronic R cerebellar infarction.  - 24hr CT post tPA (1/5) -  negative -Patient currently on aspirin, plavix, lipitor  2) Carotid Stenosis of L ICA  - 70% ICA stenosis  - s/p left CEA on 1/7 -Currently on ASA/Plavix           6.PVCs - no recent palpitations.    7. LE edema - resolved off norvasc   Past Medical History:  Diagnosis Date   Arthritis    psoriatic arthritis and osteo arthritis   Asthma    Cancer (HCC)    Prostate   Carotid artery stenosis    a.s/p L CEA in 01/2020   Collapse of lung    Colon polyps    DOE (dyspnea on exertion) 12/02/2018   Eczema    GERD (gastroesophageal reflux disease)    Hypertension    Seasonal allergies    Stroke (HCC)    Vertigo      Allergies  Allergen Reactions   Nsaids Shortness Of Breath   Aspirin Other (See Comments)    Affects patient breathing   Morphine And Codeine Hives and Itching   Norvasc [Amlodipine Besylate] Swelling    Leg swelling   Sulfa Antibiotics      Current Outpatient Medications  Medication Sig Dispense Refill   Accu-Chek Softclix Lancets lancets Test BS up to QID Dx E11.9 100 each 12   albuterol (VENTOLIN HFA)  108 (90 Base) MCG/ACT inhaler TAKE 2 PUFFS BY MOUTH EVERY 6 HOURS AS NEEDED FOR WHEEZE OR SHORTNESS OF BREATH 6.7 g 1   aspirin 81 MG chewable tablet Chew 81 mg by mouth daily.     atorvastatin (LIPITOR) 40 MG tablet Take 1 tablet (40 mg total) by mouth daily. 90 tablet 1   blood glucose meter kit and supplies KIT Dispense based on patient and insurance preference. Use up to four times daily as directed. (FOR ICD-9 250.00, 250.01). 1 each 0   chlorthalidone (HYGROTON) 25 MG tablet Take 0.5 tablets (12.5 mg total) by mouth every other day. 45 tablet 1   cholecalciferol (VITAMIN D3) 25 MCG (1000 UNIT) tablet Take 1 tablet (1,000 Units total) by mouth daily. 90 tablet 3   clopidogrel (PLAVIX) 75 MG tablet Take 1 tablet (75 mg total) by mouth daily. 90 tablet 1   escitalopram (LEXAPRO) 20 MG tablet Take 1 tablet (20 mg total) by mouth daily. 90 tablet 1    glucose blood (ACCU-CHEK GUIDE) test strip Test BS up to QID Dx E11.9 100 each 12   hydrALAZINE (APRESOLINE) 100 MG tablet Take 1 tablet (100 mg total) by mouth 3 (three) times daily. 270 tablet 1   iron polysaccharides (NIFEREX) 150 MG capsule TAKE 1 CAPSULE BY MOUTH EVERY DAY 90 capsule 1   Leuprolide Acetate, 6 Month, (LUPRON) 45 MG injection Inject 45 mg into the muscle every 6 (six) months.     metFORMIN (GLUCOPHAGE) 1000 MG tablet Take 1 tablet (1,000 mg total) by mouth 2 (two) times daily with a meal. 180 tablet 1   metoprolol succinate (TOPROL-XL) 25 MG 24 hr tablet Take 0.5 tablets (12.5 mg total) by mouth daily. 90 tablet 1   olmesartan (BENICAR) 40 MG tablet Take 1 tablet (40 mg total) by mouth daily. 90 tablet 1   pantoprazole (PROTONIX) 40 MG tablet Take 1 tablet (40 mg total) by mouth 2 (two) times daily. 180 tablet 1   QUEtiapine (SEROQUEL) 25 MG tablet Take 1 tablet (25 mg total) by mouth at bedtime. 30 tablet 2   spironolactone (ALDACTONE) 25 MG tablet Take 1 tablet (25 mg total) by mouth daily. 30 tablet 6   traMADol (ULTRAM) 50 MG tablet Take 1 tablet (50 mg total) by mouth 2 (two) times daily. 60 tablet 2   sitaGLIPtin (JANUVIA) 50 MG tablet Take 1 tablet (50 mg total) by mouth daily. (Patient not taking: Reported on 04/23/2023) 90 tablet 1   sucralfate (CARAFATE) 1 g tablet Take 1 g by mouth daily as needed. (Patient not taking: Reported on 04/23/2023)     Tiotropium Bromide-Olodaterol 2.5-2.5 MCG/ACT AERS Inhale 2 puffs into the lungs daily. (Patient not taking: Reported on 04/23/2023) 4 g 5   No current facility-administered medications for this visit.     Past Surgical History:  Procedure Laterality Date   collapsed lung     HERNIA REPAIR     PROSTATE SURGERY       Allergies  Allergen Reactions   Nsaids Shortness Of Breath   Aspirin Other (See Comments)    Affects patient breathing   Morphine And Codeine Hives and Itching   Norvasc [Amlodipine Besylate] Swelling     Leg swelling   Sulfa Antibiotics       Family History  Problem Relation Age of Onset   Lung cancer Father    Prostate cancer Father    Stomach cancer Maternal Grandmother    Prostate cancer Paternal Uncle  Cervical cancer Daughter    Psoriasis Daughter    Arthritis Daughter        psoriatic arthritis     Social History Rodney Hernandez reports that he quit smoking about 42 years ago. His smoking use included cigarettes. He started smoking about 64 years ago. He has a 44 pack-year smoking history. He has never used smokeless tobacco. Rodney Hernandez reports no history of alcohol use.   Physical Examination Today's Vitals   04/23/23 1504 04/23/23 1645  BP: (!) 160/70 (!) 158/70  Pulse: 72   SpO2: 93%   Weight: 182 lb 3.2 oz (82.6 kg)   Height: 5' 7.5" (1.715 m)    Body mass index is 28.12 kg/m.  Gen: resting comfortably, no acute distress HEENT: no scleral icterus, pupils equal round and reactive, no palptable cervical adenopathy,  CV: RRR, no mrg, no jvd Resp: Clear to auscultation bilaterally GI: abdomen is soft, non-tender, non-distended, normal bowel sounds, no hepatosplenomegaly MSK: extremities are warm, no edema.  Skin: warm, no rash Neuro:  no focal deficits Psych: appropriate affect   Diagnostic Studies   08/2015 echo Study Conclusions   - Left ventricle: The cavity size was normal. Wall thickness was   normal. Systolic function was normal. The estimated ejection   fraction was in the range of 55% to 60%. Wall motion was normal;   there were no regional wall motion abnormalities. Left   ventricular diastolic function parameters were normal for the   patient&'s age. - Aortic valve: Mildly calcified annulus. Trileaflet. There was   trivial regurgitation. - Mitral valve: Calcified annulus. There was mild regurgitation. - Right ventricle: The cavity size was mildly dilated. - Right atrium: The atrium was mildly dilated. Central venous   pressure (est): 3  mm Hg. - Tricuspid valve: There was mild regurgitation. - Pulmonary arteries: PA peak pressure: 42 mm Hg (S). - Pericardium, extracardiac: There was no pericardial effusion.   Impressions:   - Normal LV wall thickness with LVEF 55-60%. Grossly normal   diastolic function. Mild MAC with mild mitral regurgitation.   Mildly sclerotic aortic annulus with trivial aortic   regurgitation. Mild RV enlargement with normal contraction. Mild   tricuspid regurgitation with elevated PASP 42 mmHg.   11/2018 PFTs Moderate obstructive disease   11/2018 echo IMPRESSIONS     1. Left ventricular ejection fraction, by visual estimation, is 60 to  65%. The left ventricle has normal function. There is no left ventricular  hypertrophy.   2. The left ventricle has no regional wall motion abnormalities.   3. Global right ventricle has normal systolic function.The right  ventricular size is normal. No increase in right ventricular wall  thickness.   4. Left atrial size was normal.   5. Right atrial size was normal.   6. The mitral valve is normal in structure. No evidence of mitral valve  regurgitation. No evidence of mitral stenosis.   7. The tricuspid valve is normal in structure. Tricuspid valve  regurgitation is not demonstrated.   8. The aortic valve is normal in structure. Aortic valve regurgitation is  not visualized. No evidence of aortic valve sclerosis or stenosis.   9. The pulmonic valve was normal in structure. Pulmonic valve  regurgitation is not visualized.  10. The inferior vena cava is normal in size with greater than 50%  respiratory variability, suggesting right atrial pressure of 3 mmHg.    Jan 2022  Left Ventricle  Normal left ventricular size. Wall thickness is normal. Systolic  function is normal. EF: 60-65%. Wall motion is normal. Doppler parameters consistent with mild diastolic dysfunction and low to normal LA pressure.   Right Ventricle  Right ventricle is normal. Systolic  function is normal.   Left Atrium  Left atrium is mildly dilated.   Right Atrium  Normal sized right atrium.   IVC/SVC  The inferior vena cava demonstrates a diameter of <=2.1 cm and collapses >50%; therefore, the right atrial pressure is estimated at 3 mmHg.   Mitral Valve  Mitral valve structure is normal. There is trace regurgitation.   Tricuspid Valve  Tricuspid valve structure is normal. There is trace regurgitation. The right ventricular systolic pressure is normal (<36 mmHg).   Aortic Valve  The aortic valve is tricuspid. The leaflets are not thickened and exhibit normal excursion. Trace regurgitation. There is no evidence of aortic valve stenosis.   Pulmonic Valve  The pulmonic valve was not well visualized. Trace regurgitation.   Ascending Aorta  The aortic root is normal in size.   Pericardium  There is no pericardial effusion.   Assessment and Plan   1. HTN - above goal - start chlorthalidone 12.5mg  every other day to start. Prior issues with low bp's on hydrochlorothiazide years ago, try low dose conservative thiazide to begin with. Check bmet 2 weeks - leg edema on norvasc, would not restart. Low normal heart rates,, would not titrate toprol which is more for his PVCs/palpitations.  - he will call and update us  on bp's in 2 weeks. Also update if any new orthostatic symptoms   2. Hyperlipidemia - at goal, continue current meds   4. History of CVA - s/p left CEA, the intended duration of his asa/plavix is per vascular - asked him to check with his [facility] vascular team about intended duration of DAPT.   5. PVCs - no recent symptoms, continue to monitor.    Laurann Pollock, M.D.

## 2023-05-10 ENCOUNTER — Other Ambulatory Visit: Payer: Self-pay | Admitting: Nurse Practitioner

## 2023-05-10 ENCOUNTER — Other Ambulatory Visit

## 2023-05-10 ENCOUNTER — Other Ambulatory Visit: Payer: Self-pay | Admitting: Cardiology

## 2023-05-10 DIAGNOSIS — I10 Essential (primary) hypertension: Secondary | ICD-10-CM | POA: Diagnosis not present

## 2023-05-10 DIAGNOSIS — I493 Ventricular premature depolarization: Secondary | ICD-10-CM | POA: Diagnosis not present

## 2023-05-10 DIAGNOSIS — F321 Major depressive disorder, single episode, moderate: Secondary | ICD-10-CM

## 2023-05-10 DIAGNOSIS — Z79899 Other long term (current) drug therapy: Secondary | ICD-10-CM | POA: Diagnosis not present

## 2023-05-11 LAB — BASIC METABOLIC PANEL WITH GFR
BUN/Creatinine Ratio: 16 (ref 10–24)
BUN: 23 mg/dL (ref 8–27)
CO2: 22 mmol/L (ref 20–29)
Calcium: 11.7 mg/dL — ABNORMAL HIGH (ref 8.6–10.2)
Chloride: 94 mmol/L — ABNORMAL LOW (ref 96–106)
Creatinine, Ser: 1.47 mg/dL — ABNORMAL HIGH (ref 0.76–1.27)
Glucose: 93 mg/dL (ref 70–99)
Potassium: 4.9 mmol/L (ref 3.5–5.2)
Sodium: 133 mmol/L — ABNORMAL LOW (ref 134–144)
eGFR: 48 mL/min/{1.73_m2} — ABNORMAL LOW (ref >59)

## 2023-05-11 LAB — MAGNESIUM: Magnesium: 1.1 mg/dL — ABNORMAL LOW (ref 1.6–2.3)

## 2023-05-18 ENCOUNTER — Ambulatory Visit: Payer: Medicare PPO | Admitting: Nurse Practitioner

## 2023-05-18 DIAGNOSIS — I6523 Occlusion and stenosis of bilateral carotid arteries: Secondary | ICD-10-CM | POA: Diagnosis not present

## 2023-05-22 ENCOUNTER — Telehealth: Payer: Self-pay

## 2023-05-22 ENCOUNTER — Ambulatory Visit: Payer: Self-pay

## 2023-05-22 NOTE — Telephone Encounter (Signed)
 Copied from CRM [id number]. Topic: Clinical - Request for Lab/Test Order >> May 22, 2023  2:47 PM Stanly Early wrote: Reason for CRM: TSA lab order and would like blood work done at appointment on may 16th

## 2023-05-22 NOTE — Telephone Encounter (Signed)
 Fyi pt would like labs at apt this week

## 2023-05-25 ENCOUNTER — Encounter: Payer: Self-pay | Admitting: Nurse Practitioner

## 2023-05-25 ENCOUNTER — Ambulatory Visit: Payer: Medicare PPO | Admitting: Nurse Practitioner

## 2023-05-25 VITALS — BP 101/49 | HR 63 | Temp 97.4°F | Ht 67.0 in | Wt 176.0 lb

## 2023-05-25 DIAGNOSIS — Z8546 Personal history of malignant neoplasm of prostate: Secondary | ICD-10-CM

## 2023-05-25 DIAGNOSIS — D509 Iron deficiency anemia, unspecified: Secondary | ICD-10-CM

## 2023-05-25 DIAGNOSIS — L4050 Arthropathic psoriasis, unspecified: Secondary | ICD-10-CM

## 2023-05-25 DIAGNOSIS — E1169 Type 2 diabetes mellitus with other specified complication: Secondary | ICD-10-CM | POA: Diagnosis not present

## 2023-05-25 DIAGNOSIS — R600 Localized edema: Secondary | ICD-10-CM | POA: Diagnosis not present

## 2023-05-25 DIAGNOSIS — J449 Chronic obstructive pulmonary disease, unspecified: Secondary | ICD-10-CM | POA: Diagnosis not present

## 2023-05-25 DIAGNOSIS — F321 Major depressive disorder, single episode, moderate: Secondary | ICD-10-CM

## 2023-05-25 DIAGNOSIS — E119 Type 2 diabetes mellitus without complications: Secondary | ICD-10-CM

## 2023-05-25 DIAGNOSIS — F5101 Primary insomnia: Secondary | ICD-10-CM

## 2023-05-25 DIAGNOSIS — Z8673 Personal history of transient ischemic attack (TIA), and cerebral infarction without residual deficits: Secondary | ICD-10-CM

## 2023-05-25 DIAGNOSIS — E538 Deficiency of other specified B group vitamins: Secondary | ICD-10-CM

## 2023-05-25 DIAGNOSIS — I63239 Cerebral infarction due to unspecified occlusion or stenosis of unspecified carotid arteries: Secondary | ICD-10-CM | POA: Diagnosis not present

## 2023-05-25 DIAGNOSIS — E785 Hyperlipidemia, unspecified: Secondary | ICD-10-CM

## 2023-05-25 DIAGNOSIS — I10 Essential (primary) hypertension: Secondary | ICD-10-CM

## 2023-05-25 DIAGNOSIS — Z7984 Long term (current) use of oral hypoglycemic drugs: Secondary | ICD-10-CM | POA: Diagnosis not present

## 2023-05-25 DIAGNOSIS — R2681 Unsteadiness on feet: Secondary | ICD-10-CM

## 2023-05-25 LAB — BAYER DCA HB A1C WAIVED: HB A1C (BAYER DCA - WAIVED): 5.8 % — ABNORMAL HIGH (ref 4.8–5.6)

## 2023-05-25 MED ORDER — QUETIAPINE FUMARATE 50 MG PO TABS: 50.0000 mg | ORAL_TABLET | Freq: Every day | ORAL | 2 refills | Status: DC

## 2023-05-25 MED ORDER — SITAGLIPTIN PHOSPHATE 50 MG PO TABS
50.0000 mg | ORAL_TABLET | Freq: Every day | ORAL | 1 refills | Status: DC
Start: 2023-05-25 — End: 2023-08-28

## 2023-05-25 MED ORDER — ONDANSETRON HCL 4 MG PO TABS: 4.0000 mg | ORAL_TABLET | Freq: Three times a day (TID) | ORAL | 0 refills | Status: AC | PRN

## 2023-05-25 NOTE — Patient Instructions (Signed)
 Fall Prevention in the Home, Adult Falls can cause injuries and can happen to people of all ages. There are many things you can do to make your home safer and to help prevent falls. What actions can I take to prevent falls? General information Use good lighting in all rooms. Make sure to: Replace any light bulbs that burn out. Turn on the lights in dark areas and use night-lights. Keep items that you use often in easy-to-reach places. Lower the shelves around your home if needed. Move furniture so that there are clear paths around it. Do not use throw rugs or other things on the floor that can make you trip. If any of your floors are uneven, fix them. Add color or contrast paint or tape to clearly mark and help you see: Grab bars or handrails. First and last steps of staircases. Where the edge of each step is. If you use a ladder or stepladder: Make sure that it is fully opened. Do not climb a closed ladder. Make sure the sides of the ladder are locked in place. Have someone hold the ladder while you use it. Know where your pets are as you move through your home. What can I do in the bathroom?     Keep the floor dry. Clean up any water on the floor right away. Remove soap buildup in the bathtub or shower. Buildup makes bathtubs and showers slippery. Use non-skid mats or decals on the floor of the bathtub or shower. Attach bath mats securely with double-sided, non-slip rug tape. If you need to sit down in the shower, use a non-slip stool. Install grab bars by the toilet and in the bathtub and shower. Do not use towel bars as grab bars. What can I do in the bedroom? Make sure that you have a light by your bed that is easy to reach. Do not use any sheets or blankets on your bed that hang to the floor. Have a firm chair or bench with side arms that you can use for support when you get dressed. What can I do in the kitchen? Clean up any spills right away. If you need to reach something  above you, use a step stool with a grab bar. Keep electrical cords out of the way. Do not use floor polish or wax that makes floors slippery. What can I do with my stairs? Do not leave anything on the stairs. Make sure that you have a light switch at the top and the bottom of the stairs. Make sure that there are handrails on both sides of the stairs. Fix handrails that are broken or loose. Install non-slip stair treads on all your stairs if they do not have carpet. Avoid having throw rugs at the top or bottom of the stairs. Choose a carpet that does not hide the edge of the steps on the stairs. Make sure that the carpet is firmly attached to the stairs. Fix carpet that is loose or worn. What can I do on the outside of my home? Use bright outdoor lighting. Fix the edges of walkways and driveways and fix any cracks. Clear paths of anything that can make you trip, such as tools or rocks. Add color or contrast paint or tape to clearly mark and help you see anything that might make you trip as you walk through a door, such as a raised step or threshold. Trim any bushes or trees on paths to your home. Check to see if handrails are loose  or broken and that both sides of all steps have handrails. Install guardrails along the edges of any raised decks and porches. Have leaves, snow, or ice cleared regularly. Use sand, salt, or ice melter on paths if you live where there is ice and snow during the winter. Clean up any spills in your garage right away. This includes grease or oil spills. What other actions can I take? Review your medicines with your doctor. Some medicines can cause dizziness or changes in blood pressure, which increase your risk of falling. Wear shoes that: Have a low heel. Do not wear high heels. Have rubber bottoms and are closed at the toe. Feel good on your feet and fit well. Use tools that help you move around if needed. These include: Canes. Walkers. Scooters. Crutches. Ask  your doctor what else you can do to help prevent falls. This may include seeing a physical therapist to learn to do exercises to move better and get stronger. Where to find more information Centers for Disease Control and Prevention, STEAD[employer]: TonerPromos.no [employer] on Aging: BaseRingTones.pl National Institute on Aging: BaseRingTones.pl Contact a doctor if: You are afraid of falling at home. You feel weak, drowsy, or dizzy at home. You fall at home. Get help right away if you: Lose consciousness or have trouble moving after a fall. Have a fall that causes a head injury. These symptoms may be an emergency. Get help right away. Call 911. Do not wait to see if the symptoms will go away. Do not drive yourself to the hospital. This information is not intended to replace advice given to you by your health care provider. Make sure you discuss any questions you have with your health care provider. Document Revised: 08/29/2021 Document Reviewed: 08/29/2021 Elsevier Patient Education  2024 [employer].

## 2023-05-25 NOTE — Progress Notes (Signed)
 That'  Subjective:    Patient ID: Rodney Hernandez, male    DOB: Dec 22, 1945, 78 y.o.   MRN: [id number]  Chief Complaint: medical management of chronic issues     HPI:  Rodney Hernandez is a 78 y.o. who identifies as a male who was assigned male at birth.   Social history: Lives with: by hisself- his daughters check on him daily Work history: retired   Water engineer in today for follow up of the following chronic medical issues:  1. Essential hypertension No c/o chest pain, sob or headache. Does check blood pressure at home. Blood pressure has been running around 150's systolic. BP Readings from Last 3 Encounters:  05/25/23 (!) 101/49  04/23/23 (!) 158/70  02/27/23 (!) 149/60      2. Carotid stenosis, symptomatic, with infarction [facility]) Patient states that had doppler done in oct 2023 and was only 40% occluded. He denies any dizziness  3. COPD  GOLD 2 Patient saw DR. Wert 11/23/22. They stopped his BREO and started him on spirivia. That has really helped with his cough.  4. Diabetes mellitus treated with oral medication (HCC) Fasting blood sugars are running around 140-170. He does not check it every day. Increased metformin  to 1000mg  bid at last visit. Started having diarrhea on higher dose so we changed him to metformin  500mg  and januvia  100mg  daily. Family said that they never got januvia  and he still has not been taking and he did not decrease metformin  to 500mg  bid. Aaron Aas He is having lots of nausea and diarrhea. Lab Results  Component Value Date   HGBA1C 6.6 (H) 02/27/2023     5. Hyperlipidemia associated with type 2 diabetes mellitus (HCC) Does not watch diet very closely and is mot as active as he use to be. Lab Results  Component Value Date   CHOL 95 (L) 02/27/2023   HDL 37 (L) 02/27/2023   LDLCALC 37 02/27/2023   TRIG 114 02/27/2023   CHOLHDL 2.6 02/27/2023     6. Iron  deficiency anemia, unspecified iron  deficiency anemia type No c/o fatigue Lab Results  Component Value  Date   HGB 12.2 (L) 02/27/2023     7. History of  cerebrovascular accident (CVA) No permanent effects  8. Depression, major, single episode, moderate (HCC) Is on lexapro  and is doing well    02/27/2023    1:14 PM 12/01/2022    2:10 PM 08/29/2022    2:45 PM  Depression screen PHQ 2/9  Decreased Interest 1 1 0  Down, Depressed, Hopeless 0 1 0  PHQ - 2 Score 1 2 0  Altered sleeping 3 3 2   Tired, decreased energy 3 3 1   Change in appetite 1 2 1   Feeling bad or failure about yourself  1 1 0  Trouble concentrating 2 2 3   Moving slowly or fidgety/restless 1 0 0  Suicidal thoughts 0 0 0  PHQ-9 Score 12 13 7   Difficult doing work/chores Somewhat difficult Somewhat difficult Not difficult at all     9. Peripheral edema Has occasional swelling of lower ext. Has been beter  10. Hx of prostate cancer No recent voiding issues Lab Results  Component Value Date   PSA1 0.1 12/27/2022   PSA1 0.3 05/15/2022   PSA1 0.8 11/11/2021    11. Psoriatic arthritis Sees rheumatology every 6months  New complaints: -Still not sleeping well. We started him on seroquel  and that only helped 1 night. - has been getting weaker. Has trouble walking- feels unsteady- he sits around  a lot doing nothing. Allergies  Allergen Reactions   Nsaids Shortness Of Breath   Aspirin Other (See Comments)    Affects patient breathing   Morphine And Codeine Hives and Itching   Norvasc  [Amlodipine  Besylate] Swelling    Leg swelling   Sulfa Antibiotics    Outpatient Encounter Medications as of 05/25/2023  Medication Sig   Accu-Chek Softclix Lancets lancets Test BS up to QID Dx E11.9   albuterol  (VENTOLIN  HFA) 108 (90 Base) MCG/ACT inhaler TAKE 2 PUFFS BY MOUTH EVERY 6 HOURS AS NEEDED FOR WHEEZE OR SHORTNESS OF BREATH   aspirin 81 MG chewable tablet Chew 81 mg by mouth daily.   atorvastatin  (LIPITOR) 40 MG tablet Take 1 tablet (40 mg total) by mouth daily.   blood glucose meter kit and supplies KIT Dispense  based on patient and insurance preference. Use up to four times daily as directed. (FOR ICD-9 250.00, 250.01).   chlorthalidone  (HYGROTON ) 25 MG tablet Take 0.5 tablets (12.5 mg total) by mouth every other day.   cholecalciferol (VITAMIN D3) 25 MCG (1000 UNIT) tablet Take 1 tablet (1,000 Units total) by mouth daily.   clopidogrel  (PLAVIX ) 75 MG tablet Take 1 tablet (75 mg total) by mouth daily.   escitalopram  (LEXAPRO ) 20 MG tablet Take 1 tablet (20 mg total) by mouth daily.   glucose blood (ACCU-CHEK GUIDE) test strip Test BS up to QID Dx E11.9   hydrALAZINE  (APRESOLINE ) 100 MG tablet Take 1 tablet (100 mg total) by mouth 3 (three) times daily.   iron  polysaccharides (NIFEREX) 150 MG capsule TAKE 1 CAPSULE BY MOUTH EVERY DAY   Leuprolide  Acetate, 6 Month, (LUPRON ) 45 MG injection Inject 45 mg into the muscle every 6 (six) months.   metFORMIN  (GLUCOPHAGE ) 1000 MG tablet Take 1 tablet (1,000 mg total) by mouth 2 (two) times daily with a meal.   metoprolol  succinate (TOPROL -XL) 25 MG 24 hr tablet Take 0.5 tablets (12.5 mg total) by mouth daily.   olmesartan  (BENICAR ) 40 MG tablet Take 1 tablet (40 mg total) by mouth daily.   pantoprazole  (PROTONIX ) 40 MG tablet Take 1 tablet (40 mg total) by mouth 2 (two) times daily.   QUEtiapine  (SEROQUEL ) 25 MG tablet TAKE ONE TABLET AT BEDTIME   sitaGLIPtin  (JANUVIA ) 50 MG tablet Take 1 tablet (50 mg total) by mouth daily. (Patient not taking: Reported on 04/23/2023)   spironolactone  (ALDACTONE ) 25 MG tablet TAKE ONE TABLET BY MOUTH DAILY. STOP amlodipine    sucralfate  (CARAFATE ) 1 g tablet Take 1 g by mouth daily as needed. (Patient not taking: Reported on 04/23/2023)   Tiotropium Bromide -Olodaterol 2.5-2.5 MCG/ACT AERS Inhale 2 puffs into the lungs daily. (Patient not taking: Reported on 04/23/2023)   traMADol  (ULTRAM ) 50 MG tablet Take 1 tablet (50 mg total) by mouth 2 (two) times daily.   No facility-administered encounter medications on file as of 05/25/2023.     Past Surgical History:  Procedure Laterality Date   collapsed lung     HERNIA REPAIR     PROSTATE SURGERY      Family History  Problem Relation Age of Onset   Lung cancer Father    Prostate cancer Father    Stomach cancer Maternal Grandmother    Prostate cancer Paternal Uncle    Cervical cancer Daughter    Psoriasis Daughter    Arthritis Daughter        psoriatic arthritis      Controlled substance contract: n/a     Review of Systems  Constitutional:  Negative for diaphoresis.  Eyes:  Negative for pain.  Respiratory:  Negative for shortness of breath.   Cardiovascular:  Negative for chest pain, palpitations and leg swelling.  Gastrointestinal:  Negative for abdominal pain.  Endocrine: Negative for polydipsia.  Skin:  Negative for rash.  Neurological:  Negative for dizziness, weakness and headaches.  Hematological:  Does not bruise/bleed easily.  All other systems reviewed and are negative.      Objective:   Physical Exam Vitals and nursing note reviewed.  Constitutional:      Appearance: Normal appearance. He is well-developed.  HENT:     Head: Normocephalic.     Nose: Nose normal.     Mouth/Throat:     Mouth: Mucous membranes are moist.     Pharynx: Oropharynx is clear.  Eyes:     Pupils: Pupils are equal, round, and reactive to light.  Neck:     Thyroid : No thyroid  mass or thyromegaly.     Vascular: No carotid bruit or JVD.     Trachea: Phonation normal.  Cardiovascular:     Rate and Rhythm: Normal rate and regular rhythm.  Pulmonary:     Effort: Pulmonary effort is normal. No respiratory distress.     Breath sounds: Normal breath sounds.  Abdominal:     General: Bowel sounds are normal.     Palpations: Abdomen is soft.     Tenderness: There is no abdominal tenderness.  Musculoskeletal:        General: Normal range of motion.     Cervical back: Normal range of motion and neck supple.     Right lower leg: Edema (1+) present.     Left lower  leg: Edema (1+) present.     Comments: Pain on palpation right achilles tendon Pain on flexion and extension of right foot  Lymphadenopathy:     Cervical: No cervical adenopathy.  Skin:    General: Skin is warm and dry.  Neurological:     Mental Status: He is alert and oriented to person, place, and time.  Psychiatric:        Behavior: Behavior normal.        Thought Content: Thought content normal.        Judgment: Judgment normal.     BP (!) 101/49   Pulse 63   Temp (!) 97.4 F (36.3 C) (Temporal)   Ht 5\' 7"  (1.702 m)   Wt 176 lb (79.8 kg)   SpO2 95%   BMI 27.57 kg/m      Hgbac1 5.8%     Assessment & Plan:  Rodney Hernandez comes in today with chief complaint of medical management of chronic issues    Diagnosis and orders addressed:  1. Essential hypertension (Primary) Low sodium diet\decrease benicar  to 1/2 tablet daily. - CMP14+EGFR - olmesartan  (BENICAR ) 40 MG tablet; Take 1 tablet (40 mg total) by mouth daily.  Dispense: 90 tablet; Refill: 1  2. Carotid stenosis, symptomatic, with infarction [facility]) Will repeat scan next year  3. COPD  GOLD 2 Keep follow up with pulmonology  4. Diabetes mellitus treated with oral medication (HCC) Take januvia  Stop metformin  due to side effects Continue to watch blood sugars - Bayer DCA Hb A1c Waived - metFORMIN  (GLUCOPHAGE ) 1000 MG tablet; Take 1 tablet (1,000 mg total) by mouth 2 (two) times daily with a meal.  Dispense: 180 tablet; Refill: 1  5. Hyperlipidemia associated with type 2 diabetes mellitus (HCC) Low fta diet - Lipid panel - atorvastatin  (LIPITOR) 40  MG tablet; Take 1 tablet (40 mg total) by mouth daily.  Dispense: 90 tablet; Refill: 1  6. Iron  deficiency anemia, unspecified iron  deficiency anemia type Continue iron  supplements - CBC with Differential/Platelet - iron  polysaccharides (NIFEREX) 150 MG capsule; TAKE 1 CAPSULE BY MOUTH EVERY DAY  Dispense: 90 capsule; Refill: 1  7. History of CVA  (cerebrovascular accident) - clopidogrel  (PLAVIX ) 75 MG tablet; Take 1 tablet (75 mg total) by mouth daily.  Dispense: 90 tablet; Refill: 1  8. Depression, major, single episode, moderate (HCC) Stress management Added seroquel  25mg  qhs - escitalopram  (LEXAPRO ) 20 MG tablet; Take 1 tablet (20 mg total) by mouth daily.  Dispense: 90 tablet; Refill: 1  9. Peripheral edema Elevate legs when sitting  10. History prostate cancer Report any voiding issues  11. Aortic atherosclerosis (HCC) - metoprolol  succinate (TOPROL -XL) 25 MG 24 hr tablet; Take 0.5 tablets (12.5 mg total) by mouth daily.  Dispense: 90 tablet; Refill: 1  12. Gastroesophageal reflux disease without esophagitis Avoid spicy foods Do not eat 2 hours prior to bedtime  - pantoprazole  (PROTONIX ) 40 MG tablet; Take 1 tablet (40 mg total) by mouth 2 (two) times daily.  Dispense: 180 tablet; Refill: 1  13. Psoriatric arthritis Keep follow up with rheumatology  14. insomnia Bedtime routine Increase seroquel  to 50mg  nightly  15. Unsteady gait Ordered in house PT   Meds ordered this encounter  Medications   sitaGLIPtin  (JANUVIA ) 50 MG tablet    Sig: Take 1 tablet (50 mg total) by mouth daily.    Dispense:  90 tablet    Refill:  1    Supervising Provider:   DETTINGER, JOSHUA A [[id number]]   QUEtiapine  (SEROQUEL ) 50 MG tablet    Sig: Take 1 tablet (50 mg total) by mouth at bedtime.    Dispense:  30 tablet    Refill:  2    Supervising Provider:   Jolyne Needs A [id number]      Labs pending Health Maintenance reviewed Diet and exercise encouraged  Follow up plan: 3 months   Mary-Margaret Gaylyn Keas, FNP

## 2023-05-26 LAB — LIPID PANEL
Chol/HDL Ratio: 2.4 ratio (ref 0.0–5.0)
Cholesterol, Total: 83 mg/dL — ABNORMAL LOW (ref 100–199)
HDL: 34 mg/dL — ABNORMAL LOW (ref >39)
LDL Chol Calc (NIH): 30 mg/dL (ref 0–99)
Triglycerides: 99 mg/dL (ref 0–149)
VLDL Cholesterol Cal: 19 mg/dL (ref 5–40)

## 2023-05-26 LAB — CBC WITH DIFFERENTIAL/PLATELET
Basophils Absolute: 0.1 10*3/uL (ref 0.0–0.2)
Basos: 1 %
EOS (ABSOLUTE): 0.3 10*3/uL (ref 0.0–0.4)
Eos: 4 %
Hematocrit: 35.7 % — ABNORMAL LOW (ref 37.5–51.0)
Hemoglobin: 11.7 g/dL — ABNORMAL LOW (ref 13.0–17.7)
Immature Grans (Abs): 0 10*3/uL (ref 0.0–0.1)
Immature Granulocytes: 0 %
Lymphocytes Absolute: 1.5 10*3/uL (ref 0.7–3.1)
Lymphs: 16 %
MCH: 29 pg (ref 26.6–33.0)
MCHC: 32.8 g/dL (ref 31.5–35.7)
MCV: 89 fL (ref 79–97)
Monocytes Absolute: 0.9 10*3/uL (ref 0.1–0.9)
Monocytes: 10 %
Neutrophils Absolute: 6.5 10*3/uL (ref 1.4–7.0)
Neutrophils: 69 %
Platelets: 333 10*3/uL (ref 150–450)
RBC: 4.03 x10E6/uL — ABNORMAL LOW (ref 4.14–5.80)
RDW: 13.6 % (ref 11.6–15.4)
WBC: 9.4 10*3/uL (ref 3.4–10.8)

## 2023-05-26 LAB — CMP14+EGFR
ALT: 14 IU/L (ref 0–44)
AST: 19 IU/L (ref 0–40)
Albumin: 4.7 g/dL (ref 3.8–4.8)
Alkaline Phosphatase: 70 IU/L (ref 44–121)
BUN/Creatinine Ratio: 17 (ref 10–24)
BUN: 24 mg/dL (ref 8–27)
Bilirubin Total: 0.4 mg/dL (ref 0.0–1.2)
CO2: 24 mmol/L (ref 20–29)
Calcium: 12.3 mg/dL — ABNORMAL HIGH (ref 8.6–10.2)
Chloride: 95 mmol/L — ABNORMAL LOW (ref 96–106)
Creatinine, Ser: 1.4 mg/dL — ABNORMAL HIGH (ref 0.76–1.27)
Globulin, Total: 2 g/dL (ref 1.5–4.5)
Glucose: 96 mg/dL (ref 70–99)
Potassium: 5.3 mmol/L — ABNORMAL HIGH (ref 3.5–5.2)
Sodium: 135 mmol/L (ref 134–144)
Total Protein: 6.7 g/dL (ref 6.0–8.5)
eGFR: 51 mL/min/{1.73_m2} — ABNORMAL LOW (ref >59)

## 2023-05-26 LAB — PSA, TOTAL AND FREE
PSA, Free Pct: 20.7 %
PSA, Free: 0.31 ng/mL
Prostate Specific Ag, Serum: 1.5 ng/mL (ref 0.0–4.0)

## 2023-05-26 LAB — VITAMIN B12: Vitamin B-12: 231 pg/mL — ABNORMAL LOW (ref 232–1245)

## 2023-05-28 ENCOUNTER — Ambulatory Visit: Payer: Self-pay | Admitting: Nurse Practitioner

## 2023-05-29 ENCOUNTER — Ambulatory Visit (INDEPENDENT_AMBULATORY_CARE_PROVIDER_SITE_OTHER)

## 2023-05-29 ENCOUNTER — Other Ambulatory Visit: Payer: Self-pay

## 2023-05-29 DIAGNOSIS — E538 Deficiency of other specified B group vitamins: Secondary | ICD-10-CM | POA: Diagnosis not present

## 2023-05-29 MED ORDER — CYANOCOBALAMIN 1000 MCG/ML IJ SOLN: INTRAMUSCULAR | 0 refills | Status: DC

## 2023-05-29 MED ORDER — CYANOCOBALAMIN 1000 MCG/ML IJ SOLN: INTRAMUSCULAR | 10 refills | Status: DC

## 2023-05-29 MED ORDER — CYANOCOBALAMIN 1000 MCG/ML IJ SOLN
1000.0000 ug | Freq: Once | INTRAMUSCULAR | Status: AC
  Administered 2023-05-29: 1000 ug via INTRAMUSCULAR

## 2023-05-29 NOTE — Addendum Note (Signed)
 Addended by: Quinterius Gaida, MARY-MARGARET on: 05/29/2023 01:06 PM   Modules accepted: Orders

## 2023-05-29 NOTE — Progress Notes (Signed)
 Patient is in office today for a nurse visit for B12 Injection. Patient Injection was given in the  Right deltoid. Patient tolerated injection well.

## 2023-06-02 DIAGNOSIS — K219 Gastro-esophageal reflux disease without esophagitis: Secondary | ICD-10-CM | POA: Diagnosis not present

## 2023-06-02 DIAGNOSIS — I700 Atherosclerosis of aorta: Secondary | ICD-10-CM | POA: Diagnosis not present

## 2023-06-02 DIAGNOSIS — D509 Iron deficiency anemia, unspecified: Secondary | ICD-10-CM | POA: Diagnosis not present

## 2023-06-02 DIAGNOSIS — L4050 Arthropathic psoriasis, unspecified: Secondary | ICD-10-CM | POA: Diagnosis not present

## 2023-06-02 DIAGNOSIS — E785 Hyperlipidemia, unspecified: Secondary | ICD-10-CM | POA: Diagnosis not present

## 2023-06-02 DIAGNOSIS — J4489 Other specified chronic obstructive pulmonary disease: Secondary | ICD-10-CM | POA: Diagnosis not present

## 2023-06-02 DIAGNOSIS — I69351 Hemiplegia and hemiparesis following cerebral infarction affecting right dominant side: Secondary | ICD-10-CM | POA: Diagnosis not present

## 2023-06-02 DIAGNOSIS — E1169 Type 2 diabetes mellitus with other specified complication: Secondary | ICD-10-CM | POA: Diagnosis not present

## 2023-06-02 DIAGNOSIS — F321 Major depressive disorder, single episode, moderate: Secondary | ICD-10-CM | POA: Diagnosis not present

## 2023-06-05 ENCOUNTER — Telehealth: Payer: Self-pay

## 2023-06-05 NOTE — Telephone Encounter (Signed)
 Copied from CRM [id number]. Topic: Clinical - Home Health Verbal Orders >> Jun 05, 2023  9:08 AM Eleanore Grey wrote: Caller/Agency: Moira Andrews, with [facility]  Callback Number: [phone] Service Requested: Physical Therapy Frequency: once a week for 2 weeks, then twice a week for two weeks, then once a week for five weeks Any new concerns about the patient? No

## 2023-06-05 NOTE — Telephone Encounter (Signed)
 Left detailed message to give verbal okay

## 2023-06-12 DIAGNOSIS — F321 Major depressive disorder, single episode, moderate: Secondary | ICD-10-CM | POA: Diagnosis not present

## 2023-06-12 DIAGNOSIS — E785 Hyperlipidemia, unspecified: Secondary | ICD-10-CM | POA: Diagnosis not present

## 2023-06-12 DIAGNOSIS — E1169 Type 2 diabetes mellitus with other specified complication: Secondary | ICD-10-CM | POA: Diagnosis not present

## 2023-06-12 DIAGNOSIS — L4050 Arthropathic psoriasis, unspecified: Secondary | ICD-10-CM | POA: Diagnosis not present

## 2023-06-12 DIAGNOSIS — I700 Atherosclerosis of aorta: Secondary | ICD-10-CM | POA: Diagnosis not present

## 2023-06-12 DIAGNOSIS — K219 Gastro-esophageal reflux disease without esophagitis: Secondary | ICD-10-CM | POA: Diagnosis not present

## 2023-06-12 DIAGNOSIS — J4489 Other specified chronic obstructive pulmonary disease: Secondary | ICD-10-CM | POA: Diagnosis not present

## 2023-06-12 DIAGNOSIS — I69351 Hemiplegia and hemiparesis following cerebral infarction affecting right dominant side: Secondary | ICD-10-CM | POA: Diagnosis not present

## 2023-06-12 DIAGNOSIS — D509 Iron deficiency anemia, unspecified: Secondary | ICD-10-CM | POA: Diagnosis not present

## 2023-06-14 DIAGNOSIS — N400 Enlarged prostate without lower urinary tract symptoms: Secondary | ICD-10-CM | POA: Diagnosis not present

## 2023-06-14 DIAGNOSIS — C61 Malignant neoplasm of prostate: Secondary | ICD-10-CM | POA: Diagnosis not present

## 2023-06-15 ENCOUNTER — Other Ambulatory Visit: Payer: Self-pay | Admitting: Nurse Practitioner

## 2023-06-15 DIAGNOSIS — D509 Iron deficiency anemia, unspecified: Secondary | ICD-10-CM | POA: Diagnosis not present

## 2023-06-15 DIAGNOSIS — L4050 Arthropathic psoriasis, unspecified: Secondary | ICD-10-CM

## 2023-06-15 DIAGNOSIS — I69351 Hemiplegia and hemiparesis following cerebral infarction affecting right dominant side: Secondary | ICD-10-CM | POA: Diagnosis not present

## 2023-06-15 DIAGNOSIS — E1169 Type 2 diabetes mellitus with other specified complication: Secondary | ICD-10-CM | POA: Diagnosis not present

## 2023-06-15 DIAGNOSIS — I700 Atherosclerosis of aorta: Secondary | ICD-10-CM | POA: Diagnosis not present

## 2023-06-15 DIAGNOSIS — E785 Hyperlipidemia, unspecified: Secondary | ICD-10-CM | POA: Diagnosis not present

## 2023-06-15 DIAGNOSIS — K219 Gastro-esophageal reflux disease without esophagitis: Secondary | ICD-10-CM | POA: Diagnosis not present

## 2023-06-15 DIAGNOSIS — F321 Major depressive disorder, single episode, moderate: Secondary | ICD-10-CM | POA: Diagnosis not present

## 2023-06-15 DIAGNOSIS — J4489 Other specified chronic obstructive pulmonary disease: Secondary | ICD-10-CM | POA: Diagnosis not present

## 2023-06-21 DIAGNOSIS — I69351 Hemiplegia and hemiparesis following cerebral infarction affecting right dominant side: Secondary | ICD-10-CM | POA: Diagnosis not present

## 2023-06-21 DIAGNOSIS — I700 Atherosclerosis of aorta: Secondary | ICD-10-CM | POA: Diagnosis not present

## 2023-06-21 DIAGNOSIS — D509 Iron deficiency anemia, unspecified: Secondary | ICD-10-CM | POA: Diagnosis not present

## 2023-06-21 DIAGNOSIS — E785 Hyperlipidemia, unspecified: Secondary | ICD-10-CM | POA: Diagnosis not present

## 2023-06-21 DIAGNOSIS — J4489 Other specified chronic obstructive pulmonary disease: Secondary | ICD-10-CM | POA: Diagnosis not present

## 2023-06-21 DIAGNOSIS — L4050 Arthropathic psoriasis, unspecified: Secondary | ICD-10-CM | POA: Diagnosis not present

## 2023-06-21 DIAGNOSIS — F321 Major depressive disorder, single episode, moderate: Secondary | ICD-10-CM | POA: Diagnosis not present

## 2023-06-21 DIAGNOSIS — E1169 Type 2 diabetes mellitus with other specified complication: Secondary | ICD-10-CM | POA: Diagnosis not present

## 2023-06-21 DIAGNOSIS — K219 Gastro-esophageal reflux disease without esophagitis: Secondary | ICD-10-CM | POA: Diagnosis not present

## 2023-06-25 DIAGNOSIS — E1169 Type 2 diabetes mellitus with other specified complication: Secondary | ICD-10-CM | POA: Diagnosis not present

## 2023-06-25 DIAGNOSIS — F321 Major depressive disorder, single episode, moderate: Secondary | ICD-10-CM | POA: Diagnosis not present

## 2023-06-25 DIAGNOSIS — D509 Iron deficiency anemia, unspecified: Secondary | ICD-10-CM | POA: Diagnosis not present

## 2023-06-25 DIAGNOSIS — L4050 Arthropathic psoriasis, unspecified: Secondary | ICD-10-CM | POA: Diagnosis not present

## 2023-06-25 DIAGNOSIS — K219 Gastro-esophageal reflux disease without esophagitis: Secondary | ICD-10-CM | POA: Diagnosis not present

## 2023-06-25 DIAGNOSIS — I700 Atherosclerosis of aorta: Secondary | ICD-10-CM | POA: Diagnosis not present

## 2023-06-25 DIAGNOSIS — J4489 Other specified chronic obstructive pulmonary disease: Secondary | ICD-10-CM | POA: Diagnosis not present

## 2023-06-25 DIAGNOSIS — I69351 Hemiplegia and hemiparesis following cerebral infarction affecting right dominant side: Secondary | ICD-10-CM | POA: Diagnosis not present

## 2023-06-25 DIAGNOSIS — E785 Hyperlipidemia, unspecified: Secondary | ICD-10-CM | POA: Diagnosis not present

## 2023-06-27 ENCOUNTER — Ambulatory Visit: Payer: Medicare PPO

## 2023-06-27 VITALS — BP 101/49 | HR 63 | Ht 67.0 in | Wt 176.0 lb

## 2023-06-27 DIAGNOSIS — Z0000 Encounter for general adult medical examination without abnormal findings: Secondary | ICD-10-CM

## 2023-06-27 NOTE — Patient Instructions (Signed)
 Rodney Hernandez , Thank you for taking time out of your busy schedule to complete your Annual Wellness Visit with me. I enjoyed our conversation and look forward to speaking with you again next year. I, as well as your care team,  appreciate your ongoing commitment to your health goals. Please review the following plan we discussed and let me know if I can assist you in the future. Your Game plan/ To Do List    Follow up Visits: Next Medicare AWV with our clinical staff: 06/27/24 at 11:20a.m   Have you seen your provider in the last 6 months (3 months if uncontrolled diabetes)? Yes Next Office Visit with your provider: 08/28/23 at 2:30p.m.  Clinician Recommendations:  Aim for 30 minutes of exercise or brisk walking, 6-8 glasses of water, and 5 servings of fruits and vegetables each day.       This is a list of the screening recommended for you and due dates:  Health Maintenance  Topic Date Due   Colon Cancer Screening  05/24/2024*   COVID-19 Vaccine (1) 07/12/2024*   Flu Shot  08/10/2023   Eye exam for diabetics  08/30/2023   Hemoglobin A1C  11/25/2023   Yearly kidney health urinalysis for diabetes  12/01/2023   Complete foot exam   12/01/2023   DTaP/Tdap/Td vaccine (2 - Tdap) 03/02/2024   Yearly kidney function blood test for diabetes  05/24/2024   Medicare Annual Wellness Visit  06/26/2024   Pneumococcal Vaccine for age over 37  Completed   Zoster (Shingles) Vaccine  Completed   HPV Vaccine  Aged Out   Meningitis B Vaccine  Aged Out   Hepatitis C Screening  Discontinued  *Topic was postponed. The date shown is not the original due date.    Advanced directives: (Copy Requested) Please bring a copy of your health care power of attorney and living will to the office to be added to your chart at your convenience. You can mail to [facility] [address] [place], [place] [address] or email to [email or web address] Advance Care Planning is important because it:  [x]   Makes sure you receive the medical care that is consistent with your values, goals, and preferences  [x]  It provides guidance to your family and loved ones and reduces their decisional burden about whether or not they are making the right decisions based on your wishes.  Follow the link provided in your after visit summary or read over the paperwork we have mailed to you to help you started getting your Advance Directives in place. If you need assistance in completing these, please reach out to us  so that we can help you!  See attachments for Preventive Care and Fall Prevention Tips.

## 2023-06-27 NOTE — Progress Notes (Signed)
 Subjective:   Rodney Hernandez is a 78 y.o. who presents for a Medicare Wellness preventive visit.  As a reminder, Annual Wellness Visits don't include a physical exam, and some assessments may be limited, especially if this visit is performed virtually. We may recommend an in-person follow-up visit with your provider if needed.  Visit Complete: Virtual I connected with  Rodney Hernandez on 06/27/23 by a audio enabled telemedicine application and verified that I am speaking with the correct person using two identifiers.  Patient Location: Home  Provider Location: Home Office  I discussed the limitations of evaluation and management by telemedicine. The patient expressed understanding and agreed to proceed.  Vital Signs: Because this visit was a virtual/telehealth visit, some criteria may be missing or patient reported. Any vitals not documented were not able to be obtained and vitals that have been documented are patient reported.  VideoDeclined- This patient declined Librarian, academic. Therefore the visit was completed with audio only.  Persons Participating in Visit: Patient.  AWV Questionnaire: No: Patient Medicare AWV questionnaire was not completed prior to this visit.  Cardiac Risk Factors include: advanced age (>52men, >64 women);diabetes mellitus;dyslipidemia;hypertension;male gender;obesity (BMI >30kg/m2)     Objective:    Today's Vitals   06/27/23 1022 06/27/23 1026  BP: (!) 101/49   Pulse: 63   Weight: 176 lb (79.8 kg)   Height: 5' 7 (1.702 m)   PainSc:  7    Body mass index is 27.57 kg/m.     06/27/2023   10:38 AM 06/26/2022   12:10 PM 06/22/2021   12:03 PM 06/08/2020    3:42 PM 05/08/2019    1:54 PM 12/03/2018    4:00 AM 05/07/2018   11:55 AM  Advanced Directives  Does Patient Have a Medical Advance Directive? Yes No Yes Yes Yes Yes   Type of Forensic scientist of [place];Living will  Healthcare Power of [place];Living will Living will;Healthcare Power of [employer] Power of Attorney   Does patient want to make changes to medical advance directive?     No - Patient declined No - Patient declined   Copy of Healthcare Power of Attorney in Chart? No - copy requested  No - copy requested No - copy requested No - copy requested No - copy requested   Would patient like information on creating a medical advance directive?  No - Patient declined     Yes (MAU/Ambulatory/Procedural Areas - Information given)      Data saved with a previous flowsheet row definition    Current Medications (verified) Outpatient Encounter Medications as of 06/27/2023  Medication Sig   Accu-Chek Softclix Lancets lancets Test BS up to QID Dx E11.9   albuterol  (VENTOLIN  HFA) 108 (90 Base) MCG/ACT inhaler TAKE 2 PUFFS BY MOUTH EVERY 6 HOURS AS NEEDED FOR WHEEZE OR SHORTNESS OF BREATH   aspirin 81 MG chewable tablet Chew 81 mg by mouth daily.   atorvastatin  (LIPITOR) 40 MG tablet Take 1 tablet (40 mg total) by mouth daily.   blood glucose meter kit and supplies KIT Dispense based on patient and insurance preference. Use up to four times daily as directed. (FOR ICD-9 250.00, 250.01).   chlorthalidone  (HYGROTON ) 25 MG tablet Take 0.5 tablets (12.5 mg total) by mouth every other day.   cholecalciferol (VITAMIN D3) 25 MCG (1000 UNIT) tablet Take 1 tablet (1,000 Units total) by mouth daily.   clopidogrel  (PLAVIX ) 75 MG tablet Take 1 tablet (75  mg total) by mouth daily.   cyanocobalamin  (VITAMIN B12) 1000 MCG/ML injection 1 ml weekly for 4 weeks, then every other week for 4 weeks then monthly   escitalopram  (LEXAPRO ) 20 MG tablet Take 1 tablet (20 mg total) by mouth daily.   glucose blood (ACCU-CHEK GUIDE) test strip Test BS up to QID Dx E11.9   hydrALAZINE  (APRESOLINE ) 100 MG tablet Take 1 tablet (100 mg total) by mouth 3 (three) times daily.   iron  polysaccharides (NIFEREX) 150 MG capsule TAKE 1 CAPSULE  BY MOUTH EVERY DAY   Leuprolide  Acetate, 6 Month, (LUPRON ) 45 MG injection Inject 45 mg into the muscle every 6 (six) months.   metoprolol  succinate (TOPROL -XL) 25 MG 24 hr tablet Take 0.5 tablets (12.5 mg total) by mouth daily.   olmesartan  (BENICAR ) 40 MG tablet Take 1 tablet (40 mg total) by mouth daily.   ondansetron  (ZOFRAN ) 4 MG tablet Take 1 tablet (4 mg total) by mouth every 8 (eight) hours as needed for nausea or vomiting.   pantoprazole  (PROTONIX ) 40 MG tablet Take 1 tablet (40 mg total) by mouth 2 (two) times daily.   QUEtiapine  (SEROQUEL ) 50 MG tablet Take 1 tablet (50 mg total) by mouth at bedtime.   sitaGLIPtin  (JANUVIA ) 50 MG tablet Take 1 tablet (50 mg total) by mouth daily.   spironolactone  (ALDACTONE ) 25 MG tablet TAKE ONE TABLET BY MOUTH DAILY. STOP amlodipine    sucralfate  (CARAFATE ) 1 g tablet Take 1 g by mouth daily as needed.   Tiotropium Bromide -Olodaterol 2.5-2.5 MCG/ACT AERS Inhale 2 puffs into the lungs daily.   traMADol  (ULTRAM ) 50 MG tablet TAKE ONE TABLET TWICE DAILY   No facility-administered encounter medications on file as of 06/27/2023.    Allergies (verified) Nsaids, Aspirin, Morphine and codeine, Norvasc  [amlodipine  besylate], and Sulfa antibiotics   History: Past Medical History:  Diagnosis Date   Arthritis    psoriatic arthritis and osteo arthritis   Asthma    Cancer (HCC)    Prostate   Carotid artery stenosis    a.s/p L CEA in 01/2020   Collapse of lung    Colon polyps    DOE (dyspnea on exertion) 12/02/2018   Eczema    GERD (gastroesophageal reflux disease)    Hypertension    Seasonal allergies    Stroke (HCC)    Vertigo    Past Surgical History:  Procedure Laterality Date   collapsed lung     HERNIA REPAIR     PROSTATE SURGERY     Family History  Problem Relation Age of Onset   Lung cancer Father    Prostate cancer Father    Stomach cancer Maternal Grandmother    Prostate cancer Paternal Uncle    Cervical cancer Daughter     Psoriasis Daughter    Arthritis Daughter        psoriatic arthritis   Social History   Socioeconomic History   Marital status: Widowed    Spouse name: Not on file   Number of children: 2   Years of education: 12   Highest education level: High school graduate  Occupational History   Occupation: Retired    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   Right handed   12th grade   3 cups daily   His 2 daughters live within half a mile from him - both help him a lot   Social Drivers of Health   Financial Resource Strain: Low Risk  (06/27/2023)   Overall Financial Resource Strain (CARDIA)    Difficulty of Paying Living Expenses: Not hard at all  Food Insecurity: No Food Insecurity (06/27/2023)   Hunger Vital Sign    Worried About Running Out of Food in the Last Year: Never true    Ran Out of Food in the Last Year: Never true  Transportation Needs: No Transportation Needs (06/27/2023)   PRAPARE - Administrator, Civil Service (Medical): No    Lack of Transportation (Non-Medical): No  Physical Activity: Insufficiently Active (06/27/2023)   Exercise Vital Sign    Days of Exercise per Week: 7 days    Minutes of Exercise per Session: 20 min  Stress: No Stress Concern Present (06/27/2023)   [employer] of Occupational Health - Occupational Stress Questionnaire    Feeling of Stress: Only a little  Social Connections: Moderately Isolated (06/27/2023)   Social Connection and Isolation  Panel    Frequency of Communication with Friends and Family: More than three times a week    Frequency of Social Gatherings with Friends and Family: More than three times a week    Attends Religious Services: More than 4 times per year    Active Member of [employer] or Organizations: No    Attends Banker Meetings: Never    Marital Status: Widowed    Tobacco Counseling Counseling given: Yes    Clinical Intake:  Pre-visit preparation completed: Yes  Pain : 0-10 (neck/back-lower) Pain Score: 7  Pain Type: Chronic pain Pain Location: Back (neck/back is worst) Pain Orientation: Lower Pain Descriptors / Indicators: Aching, Constant Pain Onset: Other (comment) Pain Frequency: Constant Pain Relieving Factors: tramadol   Pain Relieving Factors: tramadol   BMI - recorded: 27.57 Nutritional Risks: None Diabetes: Yes CBG done?: No  Lab Results  Component Value Date   HGBA1C 5.8 (H) 05/25/2023   HGBA1C 6.6 (H) 02/27/2023   HGBA1C 8.0 (H) 12/01/2022     How often do you need to have someone help you when you read instructions, pamphlets, or other written materials from your doctor or pharmacy?: 1 - Never  Interpreter Needed?: No  Information entered by :: Alia t/cma   Activities of Daily Living     06/27/2023   10:34 AM  In your present state of health, do you have any difficulty performing the following activities:  Hearing? 1  Vision? 1  Difficulty concentrating or making decisions? 1  Walking or climbing stairs? 1  Dressing or bathing? 0  Doing errands, shopping? 1  Comment pt's daughter Insurance claims handler and eating ? N  Using the Toilet? N  In the past six months, have you accidently leaked urine? N  Do you have problems with loss of bowel control? Y  Managing your Medications? Y  Comment pt's daughter help manage his med  Managing your Finances? Y  Comment pt's daughter help  Housekeeping or managing your Housekeeping? Y  Comment  pt's daughter  help    Patient Care Team: Delfina Feller, FNP as PCP - General (Family Medicine) Amanda Jungling, Joyceann No, MD as PCP - Cardiology (Cardiology) Eulalia Heys, Deanna M, PA-C as Physician Assistant (Emergency Medicine) Rivka Chesterfield Jyl Or, MD as Referring Physician (Urology) Katheryne Pane Starla Easterly, MD as Referring Physician (Gastroenterology) Diamond Formica, MD as Consulting Physician (Pulmonary Disease)  I have updated your Care Teams any recent Medical Services you may have received from other providers in the past year.     Assessment:   This is a routine wellness examination for Rodney Hernandez.  Hearing/Vision screen Hearing Screening - Comments:: Pt have hearing problems/last hearing test done a couple/suggest getting ears flush/hearing test done agree per pt Vision Screening - Comments:: Pt wears glasses denies vision dif/pt goes [facility] In [place], [place]/last apt 2025   Goals Addressed             This Visit's Progress    Exercise 3x per week (30 min per time)   On track    Increase walking to 30-45 minutes at least 3 days per week.   06/22/2021 AWV Goal: Exercise for General Health  Patient will verbalize understanding of the benefits of increased physical activity: Exercising regularly is important. It will improve your overall fitness, flexibility, and endurance. Regular exercise also will improve your overall health. It can help you control your weight, reduce stress, and improve your bone density. Over the next year, patient will increase physical activity as tolerated with a goal of at least 150 minutes of moderate physical activity per week.  You can tell that you are exercising at a moderate intensity if your heart starts beating faster and you start breathing faster but can still hold a conversation. Moderate-intensity exercise ideas include: Walking 1 mile (1.6 km) in about 15 minutes Biking Hiking Golfing Dancing Water aerobics Patient will verbalize understanding  of everyday activities that increase physical activity by providing examples like the following: Yard work, such as: Insurance underwriter Gardening Washing windows or floors Patient will be able to explain general safety guidelines for exercising:  Before you start a new exercise program, talk with your health care provider. Do not exercise so much that you hurt yourself, feel dizzy, or get very short of breath. Wear comfortable clothes and wear shoes with good support. Drink plenty of water while you exercise to prevent dehydration or heat stroke. Work out until your breathing and your heartbeat get faster.        Depression Screen     06/27/2023   10:41 AM 05/25/2023    1:47 PM 02/27/2023    1:14 PM 12/01/2022    2:10 PM 08/29/2022    2:45 PM 06/26/2022   12:08 PM 05/15/2022    2:13 PM  PHQ 2/9 Scores  PHQ - 2 Score 1 2 1 2  0 0 1  PHQ- 9 Score 8 13 12 13 7  0 11    Fall Risk     06/27/2023   10:25 AM 05/25/2023    1:47 PM 02/27/2023    1:13 PM 12/01/2022    2:10 PM 08/29/2022    2:44 PM  Fall Risk   Falls in the past year? 0 1 1 1 1   Number falls in past yr: 0 1 1 1  0  Injury with Fall? 0 0 0 1 0  Risk for fall due to : No Fall Risks History  of fall(s) History of fall(s) History of fall(s) History of fall(s)  Follow up Falls evaluation completed Education provided Falls prevention discussed Education provided Education provided    MEDICARE RISK AT HOME:  Medicare Risk at Home Any stairs in or around the home?: Yes If so, are there any without handrails?: Yes Home free of loose throw rugs in walkways, pet beds, electrical cords, etc?: Yes Adequate lighting in your home to reduce risk of falls?: Yes Life alert?: No Use of a cane, walker or w/c?: Yes (use a cane/walker) Grab bars in the bathroom?: No Shower chair or bench in shower?: No Elevated toilet seat or a handicapped toilet?: No  TIMED  UP AND GO:  Was the test performed?  no  Cognitive Function: 6CIT completed    04/10/2017    2:58 PM  MMSE - Mini Mental State Exam  Orientation to time 5  Orientation to Place 5  Registration 3  Attention/ Calculation 5  Recall 1  Language- name 2 objects 2  Language- repeat 1  Language- follow 3 step command 3  Language- read & follow direction 1  Write a sentence 1  Copy design 1  Total score 28        06/27/2023   10:46 AM 06/26/2022   12:11 PM 06/08/2020    3:42 PM 05/08/2019    2:00 PM 05/07/2018   11:55 AM  6CIT Screen  What Year? 0 points 0 points 0 points 0 points 0 points  What month? 0 points 0 points 0 points 0 points 0 points  What time? 0 points 0 points 0 points 0 points 0 points  Count back from 20 0 points 0 points 0 points 0 points 0 points  Months in reverse 0 points 0 points 0 points 0 points 0 points  Repeat phrase 0 points 0 points 0 points 0 points 0 points  Total Score 0 points 0 points 0 points 0 points 0 points    Immunizations Immunization History  Administered Date(s) Administered   Fluad Quad(high Dose 65+) 10/11/2018, 11/06/2019, 10/21/2020, 11/02/2021   Fluad Trivalent(High Dose 65+) 10/25/2022   Influenza, High Dose Seasonal PF 10/20/2013, 10/11/2015, 10/11/2016, 11/09/2017   Influenza,inj,Quad PF,6+ Mos 10/14/2014   Pneumococcal Conjugate-13 03/02/2014   Pneumococcal Polysaccharide-23 11/10/2011   Td 03/02/2014   Zoster Recombinant(Shingrix ) 05/10/2021, 08/11/2021    Screening Tests Health Maintenance  Topic Date Due   Colonoscopy  05/24/2024 (Originally 09/05/2020)   COVID-19 Vaccine (1) 07/12/2024 (Originally 03/31/1950)   INFLUENZA VACCINE  08/10/2023   OPHTHALMOLOGY EXAM  08/30/2023   HEMOGLOBIN A1C  11/25/2023   Diabetic kidney evaluation - Urine ACR  12/01/2023   FOOT EXAM  12/01/2023   DTaP/Tdap/Td (2 - Tdap) 03/02/2024   Diabetic kidney evaluation - eGFR measurement  05/24/2024   Medicare Annual Wellness (AWV)   06/26/2024   Pneumococcal Vaccine: 50+ Years  Completed   Zoster Vaccines- Shingrix   Completed   HPV VACCINES  Aged Out   Meningococcal B Vaccine  Aged Out   Hepatitis C Screening  Discontinued    Health Maintenance  There are no preventive care reminders to display for this patient.  Health Maintenance Items Addressed: See Nurse Notes at the end of this note  Additional Screening:  Vision Screening: Recommended annual ophthalmology exams for early detection of glaucoma and other disorders of the eye. Would you like a referral to an eye doctor? No    Dental Screening: Recommended annual dental exams for proper oral hygiene  Community  Resource Referral / Chronic Care Management: CRR required this visit?  No   CCM required this visit?  No   Plan:    I have personally reviewed and noted the following in the patient's chart:   Medical and social history Use of alcohol, tobacco or illicit drugs  Current medications and supplements including opioid prescriptions. Patient is not currently taking opioid prescriptions. Functional ability and status Nutritional status Physical activity Advanced directives List of other physicians Hospitalizations, surgeries, and ER visits in previous 12 months Vitals Screenings to include cognitive, depression, and falls Referrals and appointments  In addition, I have reviewed and discussed with patient certain preventive protocols, quality metrics, and best practice recommendations. A written personalized care plan for preventive services as well as general preventive health recommendations were provided to patient.   Michaelle Adolphus, CMA   06/27/2023   After Visit Summary: (Declined) Due to this being a telephonic visit, with patients personalized plan was offered to patient but patient Declined AVS at this time   Notes: Nothing significant to report at this time.

## 2023-07-02 DIAGNOSIS — L4050 Arthropathic psoriasis, unspecified: Secondary | ICD-10-CM | POA: Diagnosis not present

## 2023-07-02 DIAGNOSIS — F321 Major depressive disorder, single episode, moderate: Secondary | ICD-10-CM | POA: Diagnosis not present

## 2023-07-02 DIAGNOSIS — K219 Gastro-esophageal reflux disease without esophagitis: Secondary | ICD-10-CM | POA: Diagnosis not present

## 2023-07-02 DIAGNOSIS — E785 Hyperlipidemia, unspecified: Secondary | ICD-10-CM | POA: Diagnosis not present

## 2023-07-02 DIAGNOSIS — I700 Atherosclerosis of aorta: Secondary | ICD-10-CM | POA: Diagnosis not present

## 2023-07-02 DIAGNOSIS — E1169 Type 2 diabetes mellitus with other specified complication: Secondary | ICD-10-CM | POA: Diagnosis not present

## 2023-07-02 DIAGNOSIS — J4489 Other specified chronic obstructive pulmonary disease: Secondary | ICD-10-CM | POA: Diagnosis not present

## 2023-07-02 DIAGNOSIS — D509 Iron deficiency anemia, unspecified: Secondary | ICD-10-CM | POA: Diagnosis not present

## 2023-07-02 DIAGNOSIS — I69351 Hemiplegia and hemiparesis following cerebral infarction affecting right dominant side: Secondary | ICD-10-CM | POA: Diagnosis not present

## 2023-07-03 ENCOUNTER — Ambulatory Visit (INDEPENDENT_AMBULATORY_CARE_PROVIDER_SITE_OTHER)

## 2023-07-03 DIAGNOSIS — I69351 Hemiplegia and hemiparesis following cerebral infarction affecting right dominant side: Secondary | ICD-10-CM

## 2023-07-03 DIAGNOSIS — L4050 Arthropathic psoriasis, unspecified: Secondary | ICD-10-CM | POA: Diagnosis not present

## 2023-07-03 DIAGNOSIS — E785 Hyperlipidemia, unspecified: Secondary | ICD-10-CM | POA: Diagnosis not present

## 2023-07-03 DIAGNOSIS — E1169 Type 2 diabetes mellitus with other specified complication: Secondary | ICD-10-CM | POA: Diagnosis not present

## 2023-07-03 DIAGNOSIS — I700 Atherosclerosis of aorta: Secondary | ICD-10-CM

## 2023-07-03 DIAGNOSIS — J4489 Other specified chronic obstructive pulmonary disease: Secondary | ICD-10-CM | POA: Diagnosis not present

## 2023-07-03 DIAGNOSIS — G4700 Insomnia, unspecified: Secondary | ICD-10-CM

## 2023-07-03 DIAGNOSIS — D509 Iron deficiency anemia, unspecified: Secondary | ICD-10-CM

## 2023-07-03 DIAGNOSIS — K219 Gastro-esophageal reflux disease without esophagitis: Secondary | ICD-10-CM | POA: Diagnosis not present

## 2023-07-03 DIAGNOSIS — F321 Major depressive disorder, single episode, moderate: Secondary | ICD-10-CM

## 2023-07-03 DIAGNOSIS — I10 Essential (primary) hypertension: Secondary | ICD-10-CM

## 2023-07-03 DIAGNOSIS — N281 Cyst of kidney, acquired: Secondary | ICD-10-CM

## 2023-07-06 ENCOUNTER — Telehealth: Payer: Self-pay | Admitting: Cardiology

## 2023-07-06 MED ORDER — HYDRALAZINE HCL 100 MG PO TABS
50.0000 mg | ORAL_TABLET | Freq: Three times a day (TID) | ORAL | 1 refills | Status: AC
Start: 2023-07-06 — End: ?

## 2023-07-06 NOTE — Telephone Encounter (Signed)
 Pt c/o BP issue: STAT if pt c/o blurred vision, one-sided weakness or slurred speech.  STAT if BP is GREATER than 180/120 TODAY.   1. What is your BP concern?  BP is low. Diastolic is remaining below 50.  2. Have you taken any BP medication today? Yes, hydralazine  100 MG 3x daily  3. What are your last 5 BP readings? 6/23: 126/56 53  6/26: 85/46 50 6/27: 75/44 52  4. Are you having any other symptoms (ex. Dizziness, headache, blurred vision, passed out)?  Off-balance/dizziness, leg numbness

## 2023-07-06 NOTE — Telephone Encounter (Signed)
 I would try lowering the hydralazine  to 50mg  tid as opposed to 100mg  tid. Monitor bp's and HRs over the weekend and update us .   JINNY Ross MD

## 2023-07-06 NOTE — Telephone Encounter (Signed)
 Pt'd daughter states that she concerned that pt is getting dehydrated from the diuretics. She reports that he only voided twice on yesterday and is constipated. Please advise.

## 2023-07-06 NOTE — Telephone Encounter (Signed)
 Pt's daughter states that pt is pale, weak and dizzy. The daughter have increasing the amount of fluids the patient intakes daily. Daughter reports that the patient only voided twice on yesterday. On 07/03/23 pt's BP was noted to be 122/62 manually. On today BP was rechecked while on the phone with [facility] and noted to be 121/58 Hr 48. All medication was given at 0830. Daughter reports that they brought a new [employer] bp machine. Daughter is wondering if blood pressure medications need to be stopped. Please advise.

## 2023-07-10 DIAGNOSIS — E785 Hyperlipidemia, unspecified: Secondary | ICD-10-CM | POA: Diagnosis not present

## 2023-07-10 DIAGNOSIS — L4050 Arthropathic psoriasis, unspecified: Secondary | ICD-10-CM | POA: Diagnosis not present

## 2023-07-10 DIAGNOSIS — K219 Gastro-esophageal reflux disease without esophagitis: Secondary | ICD-10-CM | POA: Diagnosis not present

## 2023-07-10 DIAGNOSIS — J4489 Other specified chronic obstructive pulmonary disease: Secondary | ICD-10-CM | POA: Diagnosis not present

## 2023-07-10 DIAGNOSIS — I700 Atherosclerosis of aorta: Secondary | ICD-10-CM | POA: Diagnosis not present

## 2023-07-10 DIAGNOSIS — F321 Major depressive disorder, single episode, moderate: Secondary | ICD-10-CM | POA: Diagnosis not present

## 2023-07-10 DIAGNOSIS — E1169 Type 2 diabetes mellitus with other specified complication: Secondary | ICD-10-CM | POA: Diagnosis not present

## 2023-07-10 DIAGNOSIS — I69351 Hemiplegia and hemiparesis following cerebral infarction affecting right dominant side: Secondary | ICD-10-CM | POA: Diagnosis not present

## 2023-07-10 DIAGNOSIS — D509 Iron deficiency anemia, unspecified: Secondary | ICD-10-CM | POA: Diagnosis not present

## 2023-07-10 NOTE — Telephone Encounter (Signed)
 Can hold chlorthalidone  and update us  on bp's next week   JINNY Ross MD

## 2023-07-10 NOTE — Telephone Encounter (Signed)
 Left message for emergency contact, Leita Pouch, to hold patients chlorthalidone  and call us  back next week with bp's

## 2023-07-14 LAB — MICROALBUMIN / CREATININE URINE RATIO: Albumin/Creatinine Ratio, Urine, POC: 26

## 2023-07-16 ENCOUNTER — Telehealth: Payer: Self-pay

## 2023-07-16 DIAGNOSIS — F321 Major depressive disorder, single episode, moderate: Secondary | ICD-10-CM | POA: Diagnosis not present

## 2023-07-16 DIAGNOSIS — I700 Atherosclerosis of aorta: Secondary | ICD-10-CM | POA: Diagnosis not present

## 2023-07-16 DIAGNOSIS — E785 Hyperlipidemia, unspecified: Secondary | ICD-10-CM | POA: Diagnosis not present

## 2023-07-16 DIAGNOSIS — D509 Iron deficiency anemia, unspecified: Secondary | ICD-10-CM | POA: Diagnosis not present

## 2023-07-16 DIAGNOSIS — I69351 Hemiplegia and hemiparesis following cerebral infarction affecting right dominant side: Secondary | ICD-10-CM | POA: Diagnosis not present

## 2023-07-16 DIAGNOSIS — E1169 Type 2 diabetes mellitus with other specified complication: Secondary | ICD-10-CM | POA: Diagnosis not present

## 2023-07-16 DIAGNOSIS — J4489 Other specified chronic obstructive pulmonary disease: Secondary | ICD-10-CM | POA: Diagnosis not present

## 2023-07-16 DIAGNOSIS — L4050 Arthropathic psoriasis, unspecified: Secondary | ICD-10-CM | POA: Diagnosis not present

## 2023-07-16 DIAGNOSIS — K219 Gastro-esophageal reflux disease without esophagitis: Secondary | ICD-10-CM | POA: Diagnosis not present

## 2023-07-16 NOTE — Telephone Encounter (Signed)
 Copied from CRM [id number]. Topic: Clinical - Home Health Verbal Orders >> Jul 16, 2023  1:38 PM Sasha H wrote: Caller/Agency: L[facility]/ [facility] Callback Number: [phone] Service Requested: Physical Therapy Frequency:  Any new concerns about the patient? Pt's HR 48 at rest and 55 after walking, pt's family states pt's HR is always in 40s

## 2023-07-16 NOTE — Telephone Encounter (Signed)
 FYI

## 2023-07-17 NOTE — Telephone Encounter (Signed)
 This is normal for this patient

## 2023-07-18 ENCOUNTER — Telehealth: Payer: Self-pay | Admitting: Cardiology

## 2023-07-18 NOTE — Telephone Encounter (Signed)
 Daughter is aware to stop Metoprolol . And advised her that will send to scheduling for an appointment and someone will reach out to her. Daughter stated that she was ok with going to either [place] or [place]. Patient verbalized understanding.

## 2023-07-18 NOTE — Telephone Encounter (Signed)
 Spoke with daughter regarding medication-Hydralazine  50 mg currently taking three times daily. She stated that has been working better since it was decreased. But, when she is checking his BP 2 hours after he has taken his medication in the morning it is low, this morning it was 97/47 HR 47. She checked it while we was on the phone and he hasn't had his afternoon it was 141/63 HR 48. Reported that he was no longer taking the Chlorthalidone , was making him dehydrated. He is currently in Physical Therapy and they were concerned with his HR being so low and his BP dropping when he is going from sitting to standing. No readings to provide. She stated that his HR doesn't get any higher from 42-55. But gets higher when he is up moving around to about 59 at least. She gave full reading of BP from yesterday morning 119/55 HR-59 afternoon 119/57 HR -56 and evening 109/51 HR-65. Advised her will send to Dr. Alvan for further recommendations.

## 2023-07-18 NOTE — Telephone Encounter (Signed)
 Would stop the metoprolol  for now. Can he get a PA appt please  JINNY Ross MD

## 2023-07-18 NOTE — Telephone Encounter (Signed)
 Pt's daughter is requesting a callback regarding her wanting to decrease pt medication due to her having concerns. Please.

## 2023-07-23 ENCOUNTER — Encounter: Payer: Self-pay | Admitting: Nurse Practitioner

## 2023-07-23 ENCOUNTER — Ambulatory Visit: Attending: Nurse Practitioner | Admitting: Nurse Practitioner

## 2023-07-23 VITALS — BP 128/80 | HR 72 | Ht 67.5 in | Wt 178.8 lb

## 2023-07-23 DIAGNOSIS — E785 Hyperlipidemia, unspecified: Secondary | ICD-10-CM

## 2023-07-23 DIAGNOSIS — E782 Mixed hyperlipidemia: Secondary | ICD-10-CM

## 2023-07-23 DIAGNOSIS — I10 Essential (primary) hypertension: Secondary | ICD-10-CM

## 2023-07-23 DIAGNOSIS — Z79899 Other long term (current) drug therapy: Secondary | ICD-10-CM

## 2023-07-23 DIAGNOSIS — N179 Acute kidney failure, unspecified: Secondary | ICD-10-CM

## 2023-07-23 DIAGNOSIS — Z8673 Personal history of transient ischemic attack (TIA), and cerebral infarction without residual deficits: Secondary | ICD-10-CM | POA: Diagnosis not present

## 2023-07-23 DIAGNOSIS — E875 Hyperkalemia: Secondary | ICD-10-CM | POA: Diagnosis not present

## 2023-07-23 DIAGNOSIS — R0602 Shortness of breath: Secondary | ICD-10-CM

## 2023-07-23 NOTE — Progress Notes (Unsigned)
 Cardiology Office Note   Date:  07/23/2023 ID:  Rodney Hernandez, DOB [birth year], MRN [id number] PCP: Lunger Mustard, FNP  [facility] Providers Cardiologist:  Alvan Carrier, MD     History of Present Illness Rodney Hernandez is a 78 y.o. male with a PMH of hypertension, hyperlipidemia, past history of CVA, s/p left CEA, PVCs, asthma, GERD, HLD, and vertigo, presents today for BP evaluation. He is a former smoker.   Last seen by Dr. Carrier Alvan on April 23, 2023. BP was above goal.  He was started on chlorthalidone  12.5 mg every other day to start.  Was noted he had prior issues with low blood pressures on hydrochlorothiazide  years ago.  Could not tolerate Norvasc  in the past due to leg edema.  Also with his history of low normal heart rates, was recommended to not titrate Toprol .  Ultimately chlorthalidone  caused some AKI, this medicine was discontinued and due to low magnesium he was started on magnesium oxide 400 mg twice daily x 5 days, then instructed to stop by Dr. Alvan.  His PCP stated that he need to be on a daily magnesium supplement and recommended to get one over-the-counter.  Today he presents for hypertension evaluation with his daughter.  He states while working with physical therapy, they have noticed low BP readings. SBP 99-102, was symptomatic with this. Medications were adjusted by another provider and still continued to experience dizziness, unable to walk. Our office was contacted and Dr. Alvan stated to stop the diuretic - see recent telephone notes. After medication has been adjusted recently, he is feeling better. Denies any dizziness or weakness. Denies any orthostatic symptoms. Does occasionally have SBP listed as 104, but overall doing well. He has not had repeat blood work performed. Denies any chest pain, shortness of breath, palpitations, syncope, presyncope, dizziness, orthopnea, PND, swelling or significant weight changes, acute bleeding, or  claudication.  ROS: Negative. See HPI.   Studies Reviewed  EKG: EKG is not ordered today.   Cardiac monitor report 12/2021:  See file under CV Proc  Lexiscan  11/2019:  There was no ST segment deviation noted during stress. The left ventricular ejection fraction is normal (55-65%). Nuclear stress EF: 60%. Defect 1: There is a medium fixed defect of moderate severity present in the basal inferoseptal, basal inferior, mid inferoseptal, mid inferior and apical inferior location. Given fixed defect with normal wall motion in this area, consistent with artifact No T wave inversion was noted during stress. The study is normal. This is a low risk study.  Echo 11/2018:   1. Left ventricular ejection fraction, by visual estimation, is 60 to  65%. The left ventricle has normal function. There is no left ventricular  hypertrophy.   2. The left ventricle has no regional wall motion abnormalities.   3. Global right ventricle has normal systolic function.The right  ventricular size is normal. No increase in right ventricular wall  thickness.   4. Left atrial size was normal.   5. Right atrial size was normal.   6. The mitral valve is normal in structure. No evidence of mitral valve  regurgitation. No evidence of mitral stenosis.   7. The tricuspid valve is normal in structure. Tricuspid valve  regurgitation is not demonstrated.   8. The aortic valve is normal in structure. Aortic valve regurgitation is  not visualized. No evidence of aortic valve sclerosis or stenosis.   9. The pulmonic valve was normal in structure. Pulmonic valve  regurgitation is not visualized.  10.  The inferior vena cava is normal in size with greater than 50%  respiratory variability, suggesting right atrial pressure of 3 mmHg.   Physical Exam VS:  BP 128/80   Pulse 72   Ht 5' 7.5 (1.715 m)   Wt 178 lb 12.8 oz (81.1 kg)   SpO2 98%   BMI 27.59 kg/m   Wt Readings from Last 3 Encounters:  07/23/23 178 lb  12.8 oz (81.1 kg)  06/27/23 176 lb (79.8 kg)  05/25/23 176 lb (79.8 kg)    GEN: Well nourished, well developed in no acute distress NECK: No JVD; No carotid bruits CARDIAC: S1/S2, RRR, no murmurs, rubs, gallops RESPIRATORY:  Clear to auscultation without rales, wheezing or rhonchi  ABDOMEN: Soft, non-tender, non-distended EXTREMITIES:  No edema; No deformity   ASSESSMENT AND PLAN  HTN BP trends and symptoms have improved after medication changes. Discussed to monitor BP at home at least 2 hours after medications and sitting for 5-10 minutes. No medication changes at this time. Heart healthy diet and regular cardiovascular exercise encouraged.   HLD Most recent LDL 30 05/2023. Continue atorvastatin . Heart healthy diet and regular cardiovascular exercise encouraged.   Hx of CVA, s/p left CEA Denies any recent symptoms. Continue current medication regimen. Heart healthy diet and regular cardiovascular exercise encouraged.   AKI, hyperkalemia, hypomagnesemia, medication management Most recent sCr was 1.40 with eGFR of 51 in setting of chlorthalidone  use (no longer taking). K+ was 5.3 and most recent Mag on 05/10/2023 was 1.1. 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 Will recheck a BMET and Mag. Continue to follow with PCP. Encouraged hydration.    Dispo: Follow-up with MD/APP in 6 weeks or sooner if anything changes.   Signed, Almarie Crate, NP

## 2023-07-23 NOTE — Patient Instructions (Addendum)
 Medication Instructions:  Your physician recommends that you continue on your current medications as directed. Please refer to the Current Medication list given to you today.  Labwork: In 1-2 weeks with [facility]   Testing/Procedures: None   Follow-Up: Your physician recommends that you schedule a follow-up appointment in: 6 weeks   Any Other Special Instructions Will Be Listed Below (If Applicable).  If you need a refill on your cardiac medications before your next appointment, please call your pharmacy.

## 2023-07-27 DIAGNOSIS — E1169 Type 2 diabetes mellitus with other specified complication: Secondary | ICD-10-CM | POA: Diagnosis not present

## 2023-07-27 DIAGNOSIS — K219 Gastro-esophageal reflux disease without esophagitis: Secondary | ICD-10-CM | POA: Diagnosis not present

## 2023-07-27 DIAGNOSIS — L4050 Arthropathic psoriasis, unspecified: Secondary | ICD-10-CM | POA: Diagnosis not present

## 2023-07-27 DIAGNOSIS — E785 Hyperlipidemia, unspecified: Secondary | ICD-10-CM | POA: Diagnosis not present

## 2023-07-27 DIAGNOSIS — F321 Major depressive disorder, single episode, moderate: Secondary | ICD-10-CM | POA: Diagnosis not present

## 2023-07-27 DIAGNOSIS — J4489 Other specified chronic obstructive pulmonary disease: Secondary | ICD-10-CM | POA: Diagnosis not present

## 2023-07-27 DIAGNOSIS — I700 Atherosclerosis of aorta: Secondary | ICD-10-CM | POA: Diagnosis not present

## 2023-07-27 DIAGNOSIS — I69351 Hemiplegia and hemiparesis following cerebral infarction affecting right dominant side: Secondary | ICD-10-CM | POA: Diagnosis not present

## 2023-07-27 DIAGNOSIS — D509 Iron deficiency anemia, unspecified: Secondary | ICD-10-CM | POA: Diagnosis not present

## 2023-07-27 MED ORDER — SPIRONOLACTONE 25 MG PO TABS: 12.5000 mg | ORAL_TABLET | Freq: Every day | ORAL | 6 refills | Status: DC

## 2023-07-27 NOTE — Telephone Encounter (Signed)
 Spoke with daughter Rayfield Reasoner and informed of E. Peck's response. Pt to decrease Aldactone  to 12.5 mg daily and monitor BP and call in 1 week with readings.

## 2023-07-27 NOTE — Addendum Note (Signed)
 Addended by: Deran Barro G on: 07/27/2023 01:52 PM   Modules accepted: Orders

## 2023-07-27 NOTE — Telephone Encounter (Signed)
 Called daughter to notify of med changes. No answer. Left msg to call back.

## 2023-07-27 NOTE — Telephone Encounter (Signed)
 error

## 2023-07-27 NOTE — Telephone Encounter (Signed)
 Called Leita daughter to get BP readings. No answer. Left msg to call back.

## 2023-07-27 NOTE — Telephone Encounter (Signed)
 Spoke with Daughter Leita and current BP's are as follows  7/15 100/83 71         114/50 71 7/16 128/50 79         134/68 75 7/17 109/56 57         144/66 50 7/18 113/48 67

## 2023-08-03 ENCOUNTER — Telehealth: Payer: Self-pay | Admitting: Nurse Practitioner

## 2023-08-03 NOTE — Telephone Encounter (Signed)
 Patient informed and verbalized understanding of plan.

## 2023-08-03 NOTE — Telephone Encounter (Signed)
 Patient is feeling much better no more dizziness or lightheadedness. He is doing much better since the medication change

## 2023-08-03 NOTE — Telephone Encounter (Signed)
 New Message:      Patient's Spironolactone  dose was lowered. Patient was told to call back with a week of blood pressure readings after taking the lower dose.    07-27-23    11:00 AM 113/48 pulse 67  5:00 PM 111/57  pulse 71  07-28-23      11:00 AM 136/54 pulse 64  5:00 PM 125/57 pulse 68  07-29-23       11:00 AM 123/51 pulse 59   5:00 PM  138/59 pulse 57  07-30-23        11:00 AM  103/46 pulse 73   5:00 PM  136/59 pulse 61  07-31-23         11:00 AM  116/50 pulse 66   5:00 PM  116/49 pulse 63  08-01-23          11:00 AM  153/55 pulse 72  5:00 PM   150/63 pulse 60- daughter says he was more active that day  08-02-23           11:00 AM 125/50 pulse 63  5:00 PM  125/56 pulse 64  08-03-23             11:00 AM 104/53 pulse 65

## 2023-08-16 ENCOUNTER — Other Ambulatory Visit: Payer: Self-pay | Admitting: Nurse Practitioner

## 2023-08-16 DIAGNOSIS — D509 Iron deficiency anemia, unspecified: Secondary | ICD-10-CM

## 2023-08-28 ENCOUNTER — Ambulatory Visit (INDEPENDENT_AMBULATORY_CARE_PROVIDER_SITE_OTHER): Admitting: Nurse Practitioner

## 2023-08-28 ENCOUNTER — Encounter: Payer: Self-pay | Admitting: Nurse Practitioner

## 2023-08-28 VITALS — BP 129/65 | HR 66 | Temp 98.1°F | Ht 67.0 in | Wt 181.0 lb

## 2023-08-28 DIAGNOSIS — E119 Type 2 diabetes mellitus without complications: Secondary | ICD-10-CM | POA: Diagnosis not present

## 2023-08-28 DIAGNOSIS — E785 Hyperlipidemia, unspecified: Secondary | ICD-10-CM | POA: Diagnosis not present

## 2023-08-28 DIAGNOSIS — K219 Gastro-esophageal reflux disease without esophagitis: Secondary | ICD-10-CM

## 2023-08-28 DIAGNOSIS — I700 Atherosclerosis of aorta: Secondary | ICD-10-CM

## 2023-08-28 DIAGNOSIS — L4050 Arthropathic psoriasis, unspecified: Secondary | ICD-10-CM

## 2023-08-28 DIAGNOSIS — Z0001 Encounter for general adult medical examination with abnormal findings: Secondary | ICD-10-CM | POA: Diagnosis not present

## 2023-08-28 DIAGNOSIS — E1169 Type 2 diabetes mellitus with other specified complication: Secondary | ICD-10-CM

## 2023-08-28 DIAGNOSIS — F5101 Primary insomnia: Secondary | ICD-10-CM

## 2023-08-28 DIAGNOSIS — F321 Major depressive disorder, single episode, moderate: Secondary | ICD-10-CM

## 2023-08-28 DIAGNOSIS — Z7984 Long term (current) use of oral hypoglycemic drugs: Secondary | ICD-10-CM | POA: Diagnosis not present

## 2023-08-28 DIAGNOSIS — J449 Chronic obstructive pulmonary disease, unspecified: Secondary | ICD-10-CM | POA: Diagnosis not present

## 2023-08-28 DIAGNOSIS — D509 Iron deficiency anemia, unspecified: Secondary | ICD-10-CM

## 2023-08-28 DIAGNOSIS — Z8673 Personal history of transient ischemic attack (TIA), and cerebral infarction without residual deficits: Secondary | ICD-10-CM

## 2023-08-28 DIAGNOSIS — I63239 Cerebral infarction due to unspecified occlusion or stenosis of unspecified carotid arteries: Secondary | ICD-10-CM

## 2023-08-28 DIAGNOSIS — I6930 Unspecified sequelae of cerebral infarction: Secondary | ICD-10-CM | POA: Diagnosis not present

## 2023-08-28 DIAGNOSIS — R600 Localized edema: Secondary | ICD-10-CM

## 2023-08-28 DIAGNOSIS — Z79899 Other long term (current) drug therapy: Secondary | ICD-10-CM | POA: Diagnosis not present

## 2023-08-28 DIAGNOSIS — I10 Essential (primary) hypertension: Secondary | ICD-10-CM

## 2023-08-28 DIAGNOSIS — R0602 Shortness of breath: Secondary | ICD-10-CM | POA: Diagnosis not present

## 2023-08-28 DIAGNOSIS — E782 Mixed hyperlipidemia: Secondary | ICD-10-CM | POA: Diagnosis not present

## 2023-08-28 DIAGNOSIS — Z0000 Encounter for general adult medical examination without abnormal findings: Secondary | ICD-10-CM

## 2023-08-28 LAB — LIPID PANEL

## 2023-08-28 LAB — BAYER DCA HB A1C WAIVED: HB A1C (BAYER DCA - WAIVED): 6.2 % — ABNORMAL HIGH (ref 4.8–5.6)

## 2023-08-28 MED ORDER — PANTOPRAZOLE SODIUM 40 MG PO TBEC
40.0000 mg | DELAYED_RELEASE_TABLET | Freq: Two times a day (BID) | ORAL | 1 refills | Status: AC
Start: 2023-08-28 — End: ?

## 2023-08-28 MED ORDER — ESCITALOPRAM OXALATE 20 MG PO TABS
20.0000 mg | ORAL_TABLET | Freq: Every day | ORAL | 1 refills | Status: AC
Start: 2023-08-28 — End: ?

## 2023-08-28 MED ORDER — CLOPIDOGREL BISULFATE 75 MG PO TABS
75.0000 mg | ORAL_TABLET | Freq: Every day | ORAL | 1 refills | Status: AC
Start: 2023-08-28 — End: ?

## 2023-08-28 MED ORDER — QUETIAPINE FUMARATE 50 MG PO TABS: 50.0000 mg | ORAL_TABLET | Freq: Every day | ORAL | 1 refills | Status: DC

## 2023-08-28 MED ORDER — TRAMADOL HCL 50 MG PO TABS: 50.0000 mg | ORAL_TABLET | Freq: Two times a day (BID) | ORAL | 2 refills | Status: DC

## 2023-08-28 MED ORDER — POLYSACCHARIDE IRON COMPLEX 150 MG PO CAPS: 150.0000 mg | ORAL_CAPSULE | Freq: Every day | ORAL | 1 refills | Status: AC

## 2023-08-28 MED ORDER — METOPROLOL SUCCINATE ER 25 MG PO TB24: 12.5000 mg | ORAL_TABLET | Freq: Every day | ORAL | 1 refills | Status: DC

## 2023-08-28 MED ORDER — OLMESARTAN MEDOXOMIL 40 MG PO TABS: 40.0000 mg | ORAL_TABLET | Freq: Every day | ORAL | 1 refills | Status: AC

## 2023-08-28 MED ORDER — ATORVASTATIN CALCIUM 40 MG PO TABS: 40.0000 mg | ORAL_TABLET | Freq: Every day | ORAL | 1 refills | Status: AC

## 2023-08-28 MED ORDER — SITAGLIPTIN PHOSPHATE 50 MG PO TABS: 50.0000 mg | ORAL_TABLET | Freq: Every day | ORAL | 1 refills | Status: AC

## 2023-08-28 NOTE — Progress Notes (Signed)
 That'  Subjective:    Patient ID: Rodney Hernandez, male    DOB: April 15, 1945, 78 y.o.   MRN: [id number]  Chief Complaint: annual phjysical    HPI:  Rodney Hernandez is a 78 y.o. who identifies as a male who was assigned male at birth.   Social history: Lives with: by hisself- his daughters check on him daily Work history: retired   Water engineer in today for follow up of the following chronic medical issues:  1. Essential hypertension No c/o chest pain, sob or headache. Does check blood pressure at home. Blood pressure has been running around 150's systolic. BP Readings from Last 3 Encounters:  07/23/23 128/80  06/27/23 (!) 101/49  05/25/23 (!) 101/49      2. Carotid stenosis, symptomatic, with infarction [facility]) Patient states that had doppler done in oct 2023 and was only 40% occluded. He denies any dizziness  3. COPD  GOLD 2 Patient saw DR. Wert 11/23/22. They stopped his BREO and started him on spirivia. That has really helped with his cough.  4. Diabetes mellitus treated with oral medication (HCC) Fasting blood sugars are running around 140-170. He is now only doing januvia  and is doing well. Lab Results  Component Value Date   HGBA1C 5.8 (H) 05/25/2023     5. Hyperlipidemia associated with type 2 diabetes mellitus (HCC) Does not watch diet very closely and is mot as active as he use to be. Lab Results  Component Value Date   CHOL 83 (L) 05/25/2023   HDL 34 (L) 05/25/2023   LDLCALC 30 05/25/2023   TRIG 99 05/25/2023   CHOLHDL 2.4 05/25/2023     6. Iron  deficiency anemia, unspecified iron  deficiency anemia type No c/o fatigue Lab Results  Component Value Date   HGB 11.7 (L) 05/25/2023     7. History of  cerebrovascular accident (CVA) No permanent effects  8. Depression, major, single episode, moderate (HCC) Is on lexapro  and is doing well    08/28/2023    3:12 PM 06/27/2023   10:41 AM 05/25/2023    1:47 PM  Depression screen PHQ 2/9  Decreased Interest 0 0 2   Down, Depressed, Hopeless 0 1 0  PHQ - 2 Score 0 1 2  Altered sleeping  3 3  Tired, decreased energy  3 3  Change in appetite  0 1  Feeling bad or failure about yourself   1 0  Trouble concentrating  0 3  Moving slowly or fidgety/restless  0 1  Suicidal thoughts  0 0  PHQ-9 Score  8 13  Difficult doing work/chores  Somewhat difficult Not difficult at all     9. Peripheral edema Has occasional swelling of lower ext. Has been beter  10. Hx of prostate cancer No recent voiding issues Lab Results  Component Value Date   PSA1 1.5 05/25/2023   PSA1 0.1 12/27/2022   PSA1 0.3 05/15/2022    11. Psoriatic arthritis Sees rheumatology every 6months  New complaints: None  today  Allergies  Allergen Reactions   Nsaids Shortness Of Breath   Aspirin Other (See Comments)    Affects patient breathing   Morphine And Codeine Hives and Itching   Norvasc  [Amlodipine  Besylate] Swelling    Leg swelling   Sulfa Antibiotics    Outpatient Encounter Medications as of 08/28/2023  Medication Sig   Accu-Chek Softclix Lancets lancets Test BS up to QID Dx E11.9   albuterol  (VENTOLIN  HFA) 108 (90 Base) MCG/ACT inhaler TAKE 2 PUFFS BY MOUTH  EVERY 6 HOURS AS NEEDED FOR WHEEZE OR SHORTNESS OF BREATH   aspirin 81 MG chewable tablet Chew 81 mg by mouth daily.   atorvastatin  (LIPITOR) 40 MG tablet Take 1 tablet (40 mg total) by mouth daily.   blood glucose meter kit and supplies KIT Dispense based on patient and insurance preference. Use up to four times daily as directed. (FOR ICD-9 250.00, 250.01).   cholecalciferol (VITAMIN D3) 25 MCG (1000 UNIT) tablet Take 1 tablet (1,000 Units total) by mouth daily.   clopidogrel  (PLAVIX ) 75 MG tablet Take 1 tablet (75 mg total) by mouth daily.   cyanocobalamin  (VITAMIN B12) 1000 MCG/ML injection 1 ml weekly for 4 weeks, then every other week for 4 weeks then monthly   escitalopram  (LEXAPRO ) 20 MG tablet Take 1 tablet (20 mg total) by mouth daily.   glucose blood  (ACCU-CHEK GUIDE) test strip Test BS up to QID Dx E11.9   hydrALAZINE  (APRESOLINE ) 100 MG tablet Take 0.5 tablets (50 mg total) by mouth 3 (three) times daily.   iron  polysaccharides (IFEREX 150) 150 MG capsule TAKE ONE CAPSULE ONCE DAILY   Leuprolide  Acetate, 6 Month, (LUPRON ) 45 MG injection Inject 45 mg into the muscle every 6 (six) months.   Magnesium 300 MG CAPS Take by mouth.   metoprolol  succinate (TOPROL -XL) 25 MG 24 hr tablet Take 0.5 tablets (12.5 mg total) by mouth daily.   olmesartan  (BENICAR ) 40 MG tablet Take 1 tablet (40 mg total) by mouth daily.   ondansetron  (ZOFRAN ) 4 MG tablet Take 1 tablet (4 mg total) by mouth every 8 (eight) hours as needed for nausea or vomiting.   pantoprazole  (PROTONIX ) 40 MG tablet Take 1 tablet (40 mg total) by mouth 2 (two) times daily.   QUEtiapine  (SEROQUEL ) 50 MG tablet Take 1 tablet (50 mg total) by mouth at bedtime.   sitaGLIPtin  (JANUVIA ) 50 MG tablet Take 1 tablet (50 mg total) by mouth daily.   spironolactone  (ALDACTONE ) 25 MG tablet Take 0.5 tablets (12.5 mg total) by mouth daily.   Tiotropium Bromide -Olodaterol 2.5-2.5 MCG/ACT AERS Inhale 2 puffs into the lungs daily.   traMADol  (ULTRAM ) 50 MG tablet TAKE ONE TABLET TWICE DAILY   sucralfate  (CARAFATE ) 1 g tablet Take 1 g by mouth daily as needed. (Patient not taking: Reported on 08/28/2023)   [DISCONTINUED] chlorthalidone  (HYGROTON ) 25 MG tablet Take 0.5 tablets (12.5 mg total) by mouth every other day.   No facility-administered encounter medications on file as of 08/28/2023.    Past Surgical History:  Procedure Laterality Date   collapsed lung     HERNIA REPAIR     PROSTATE SURGERY      Family History  Problem Relation Age of Onset   Lung cancer Father    Prostate cancer Father    Stomach cancer Maternal Grandmother    Prostate cancer Paternal Uncle    Cervical cancer Daughter    Psoriasis Daughter    Arthritis Daughter        psoriatic arthritis      Controlled substance  contract: n/a     Review of Systems  Constitutional:  Negative for diaphoresis.  Eyes:  Negative for pain.  Respiratory:  Negative for shortness of breath.   Cardiovascular:  Negative for chest pain, palpitations and leg swelling.  Gastrointestinal:  Negative for abdominal pain.  Endocrine: Negative for polydipsia.  Skin:  Negative for rash.  Neurological:  Negative for dizziness, weakness and headaches.  Hematological:  Does not bruise/bleed easily.  All other  systems reviewed and are negative.      Objective:   Physical Exam Vitals and nursing note reviewed.  Constitutional:      Appearance: Normal appearance. He is well-developed.  HENT:     Head: Normocephalic.     Nose: Nose normal.     Mouth/Throat:     Mouth: Mucous membranes are moist.     Pharynx: Oropharynx is clear.  Eyes:     Pupils: Pupils are equal, round, and reactive to light.  Neck:     Thyroid : No thyroid  mass or thyromegaly.     Vascular: No carotid bruit or JVD.     Trachea: Phonation normal.  Cardiovascular:     Rate and Rhythm: Normal rate and regular rhythm.  Pulmonary:     Effort: Pulmonary effort is normal. No respiratory distress.     Breath sounds: Normal breath sounds.  Abdominal:     General: Bowel sounds are normal.     Palpations: Abdomen is soft.     Tenderness: There is no abdominal tenderness.     Hernia: A hernia (ventral hernia soft and nontender) is present.  Musculoskeletal:        General: Normal range of motion.     Cervical back: Normal range of motion and neck supple.     Right lower leg: Edema (1+) present.     Left lower leg: Edema (1+) present.     Comments: Pain on palpation right achilles tendon Pain on flexion and extension of right foot  Lymphadenopathy:     Cervical: No cervical adenopathy.  Skin:    General: Skin is warm and dry.  Neurological:     Mental Status: He is alert and oriented to person, place, and time.  Psychiatric:        Behavior: Behavior  normal.        Thought Content: Thought content normal.        Judgment: Judgment normal.     BP 129/65   Pulse 66   Temp 98.1 F (36.7 C) (Temporal)   Ht 5' 7 (1.702 m)   Wt 181 lb (82.1 kg)   SpO2 95%   BMI 28.35 kg/m     Hgbac1 6.2%     Assessment & Plan:  Rodney Hernandez comes in today with chief complaint of annual physical   Diagnosis and orders addressed:  1. Essential hypertension (Primary) Low sodium diet\decrease benicar  to 1/2 tablet daily. - CMP14+EGFR - olmesartan  (BENICAR ) 40 MG tablet; Take 1 tablet (40 mg total) by mouth daily.  Dispense: 90 tablet; Refill: 1  2. Carotid stenosis, symptomatic, with infarction [facility]) Will repeat scan next year  3. COPD  GOLD 2 Keep follow up with pulmonology  4. Diabetes mellitus treated with oral medication (HCC) Take januvia  Stop metformin  due to side effects Continue to watch blood sugars - Bayer DCA Hb A1c Waived - metFORMIN  (GLUCOPHAGE ) 1000 MG tablet; Take 1 tablet (1,000 mg total) by mouth 2 (two) times daily with a meal.  Dispense: 180 tablet; Refill: 1  5. Hyperlipidemia associated with type 2 diabetes mellitus (HCC) Low fta diet - Lipid panel - atorvastatin  (LIPITOR) 40 MG tablet; Take 1 tablet (40 mg total) by mouth daily.  Dispense: 90 tablet; Refill: 1  6. Iron  deficiency anemia, unspecified iron  deficiency anemia type Continue iron  supplements - CBC with Differential/Platelet - iron  polysaccharides (NIFEREX) 150 MG capsule; TAKE 1 CAPSULE BY MOUTH EVERY DAY  Dispense: 90 capsule; Refill: 1  7. History of CVA (  cerebrovascular accident) - clopidogrel  (PLAVIX ) 75 MG tablet; Take 1 tablet (75 mg total) by mouth daily.  Dispense: 90 tablet; Refill: 1  8. Depression, major, single episode, moderate (HCC) Stress management Added seroquel  25mg  qhs - escitalopram  (LEXAPRO ) 20 MG tablet; Take 1 tablet (20 mg total) by mouth daily.  Dispense: 90 tablet; Refill: 1  9. Peripheral edema Elevate legs when  sitting  10. History prostate cancer Report any voiding issues  11. Aortic atherosclerosis (HCC) - metoprolol  succinate (TOPROL -XL) 25 MG 24 hr tablet; Take 0.5 tablets (12.5 mg total) by mouth daily.  Dispense: 90 tablet; Refill: 1  12. Gastroesophageal reflux disease without esophagitis Avoid spicy foods Do not eat 2 hours prior to bedtime  - pantoprazole  (PROTONIX ) 40 MG tablet; Take 1 tablet (40 mg total) by mouth 2 (two) times daily.  Dispense: 180 tablet; Refill: 1  13. Psoriatric arthritis Keep follow up with rheumatology  14. insomnia Bedtime routine Increase seroquel  to 50mg  nightly  15. Unsteady gait Doing much better- no more PT needed   BP 129/65   Pulse 66   Temp 98.1 F (36.7 C) (Temporal)   Ht 5' 7 (1.702 m)   Wt 181 lb (82.1 kg)   SpO2 95%   BMI 28.35 kg/m      Labs pending Health Maintenance reviewed Diet and exercise encouraged  Follow up plan: 3 months   Mary-Margaret Gladis, FNP

## 2023-08-29 ENCOUNTER — Ambulatory Visit: Payer: Self-pay | Admitting: Nurse Practitioner

## 2023-08-29 ENCOUNTER — Other Ambulatory Visit: Payer: Self-pay

## 2023-08-29 ENCOUNTER — Emergency Department (HOSPITAL_COMMUNITY)
Admission: EM | Admit: 2023-08-29 | Discharge: 2023-08-29 | Disposition: A | Discharge status: Home / Self Care | Attending: Emergency Medicine | Admitting: Emergency Medicine

## 2023-08-29 ENCOUNTER — Encounter (HOSPITAL_COMMUNITY): Payer: Self-pay | Admitting: Emergency Medicine

## 2023-08-29 ENCOUNTER — Telehealth: Payer: Self-pay | Admitting: Cardiology

## 2023-08-29 DIAGNOSIS — Z79899 Other long term (current) drug therapy: Secondary | ICD-10-CM | POA: Diagnosis not present

## 2023-08-29 DIAGNOSIS — Z7902 Long term (current) use of antithrombotics/antiplatelets: Secondary | ICD-10-CM | POA: Insufficient documentation

## 2023-08-29 DIAGNOSIS — E860 Dehydration: Secondary | ICD-10-CM | POA: Insufficient documentation

## 2023-08-29 DIAGNOSIS — Z7982 Long term (current) use of aspirin: Secondary | ICD-10-CM | POA: Diagnosis not present

## 2023-08-29 DIAGNOSIS — I10 Essential (primary) hypertension: Secondary | ICD-10-CM | POA: Diagnosis not present

## 2023-08-29 DIAGNOSIS — R531 Weakness: Secondary | ICD-10-CM | POA: Diagnosis present

## 2023-08-29 LAB — CBC WITH DIFFERENTIAL/PLATELET
Abs Immature Granulocytes: 0.08 K/uL — ABNORMAL HIGH (ref 0.00–0.07)
Basophils Absolute: 0.1 x10E3/uL (ref 0.0–0.2)
Basophils Absolute: 0.1 K/uL (ref 0.0–0.1)
Basophils Relative: 1 %
Basos: 1 %
EOS (ABSOLUTE): 0.5 x10E3/uL — ABNORMAL HIGH (ref 0.0–0.4)
Eos: 5 %
Eosinophils Absolute: 0.5 K/uL (ref 0.0–0.5)
Eosinophils Relative: 5 %
HCT: 33.1 % — ABNORMAL LOW (ref 39.0–52.0)
Hematocrit: 35.4 % — ABNORMAL LOW (ref 37.5–51.0)
Hemoglobin: 11.3 g/dL — ABNORMAL LOW (ref 13.0–17.7)
Hemoglobin: 11 g/dL — ABNORMAL LOW (ref 13.0–17.0)
Immature Grans (Abs): 0.1 x10E3/uL (ref 0.0–0.1)
Immature Granulocytes: 1 %
Immature Granulocytes: 1 %
Lymphocytes Absolute: 1.6 x10E3/uL (ref 0.7–3.1)
Lymphocytes Relative: 16 %
Lymphs Abs: 1.6 K/uL (ref 0.7–4.0)
Lymphs: 17 %
MCH: 29.4 pg (ref 26.6–33.0)
MCH: 30.1 pg (ref 26.0–34.0)
MCHC: 31.9 g/dL (ref 31.5–35.7)
MCHC: 33.2 g/dL (ref 30.0–36.0)
MCV: 92 fL (ref 79–97)
MCV: 90.4 fL (ref 80.0–100.0)
Monocytes Absolute: 1 x10E3/uL — ABNORMAL HIGH (ref 0.1–0.9)
Monocytes Absolute: 1 K/uL (ref 0.1–1.0)
Monocytes Relative: 11 %
Monocytes: 11 %
Neutro Abs: 6.6 K/uL (ref 1.7–7.7)
Neutrophils Absolute: 6 x10E3/uL (ref 1.4–7.0)
Neutrophils Relative %: 66 %
Neutrophils: 65 %
Platelets: 340 x10E3/uL (ref 150–450)
Platelets: 322 K/uL (ref 150–400)
RBC: 3.84 x10E6/uL — ABNORMAL LOW (ref 4.14–5.80)
RBC: 3.66 MIL/uL — ABNORMAL LOW (ref 4.22–5.81)
RDW: 12.7 % (ref 11.6–15.4)
RDW: 13 % (ref 11.5–15.5)
WBC: 9.1 x10E3/uL (ref 3.4–10.8)
WBC: 9.9 K/uL (ref 4.0–10.5)
nRBC: 0 % (ref 0.0–0.2)

## 2023-08-29 LAB — CMP14+EGFR
ALT: 7 IU/L (ref 0–44)
AST: 12 IU/L (ref 0–40)
Albumin: 4.8 g/dL (ref 3.8–4.8)
Alkaline Phosphatase: 69 IU/L (ref 44–121)
BUN/Creatinine Ratio: 20 (ref 10–24)
BUN: 38 mg/dL — AB (ref 8–27)
Bilirubin Total: 0.3 mg/dL (ref 0.0–1.2)
CO2: 23 mmol/L (ref 20–29)
Calcium: 11.8 mg/dL — AB (ref 8.6–10.2)
Chloride: 97 mmol/L (ref 96–106)
Creatinine, Ser: 1.94 mg/dL — AB (ref 0.76–1.27)
Globulin, Total: 2 g/dL (ref 1.5–4.5)
Glucose: 118 mg/dL — AB (ref 70–99)
Potassium: 6.2 mmol/L — AB (ref 3.5–5.2)
Sodium: 136 mmol/L (ref 134–144)
Total Protein: 6.8 g/dL (ref 6.0–8.5)
eGFR: 34 mL/min/1.73 — AB (ref >59)

## 2023-08-29 LAB — BASIC METABOLIC PANEL WITH GFR
Anion gap: 13 (ref 5–15)
BUN: 35 mg/dL — ABNORMAL HIGH (ref 8–23)
CO2: 23 mmol/L (ref 22–32)
Calcium: 11.1 mg/dL — ABNORMAL HIGH (ref 8.9–10.3)
Chloride: 97 mmol/L — ABNORMAL LOW (ref 98–111)
Creatinine, Ser: 1.56 mg/dL — ABNORMAL HIGH (ref 0.61–1.24)
GFR, Estimated: 45 mL/min — ABNORMAL LOW (ref >60)
Glucose, Bld: 123 mg/dL — ABNORMAL HIGH (ref 70–99)
Potassium: 4.6 mmol/L (ref 3.5–5.1)
Sodium: 133 mmol/L — ABNORMAL LOW (ref 135–145)

## 2023-08-29 LAB — LIPID PANEL
Cholesterol, Total: 94 mg/dL — AB (ref 100–199)
HDL: 37 mg/dL — AB (ref >39)
LDL CALC COMMENT:: 2.5 ratio (ref 0.0–5.0)
LDL Chol Calc (NIH): 40 mg/dL (ref 0–99)
Triglycerides: 84 mg/dL (ref 0–149)
VLDL Cholesterol Cal: 17 mg/dL (ref 5–40)

## 2023-08-29 MED ORDER — FUROSEMIDE 20 MG PO TABS: 10.0000 mg | ORAL_TABLET | Freq: Every day | ORAL | 3 refills | Status: DC

## 2023-08-29 NOTE — Telephone Encounter (Signed)
 Caller [facility]) is reporting critical results.

## 2023-08-29 NOTE — Discharge Instructions (Signed)
 Drink more fluids and follow-up with your doctor as planned

## 2023-08-29 NOTE — ED Provider Notes (Signed)
 [place] EMERGENCY DEPARTMENT AT [facility] Provider Note   CSN: [id number] Arrival date & time: 08/29/23  1553     Patient presents with: Abnormal Labs   Rodney Hernandez is a 78 y.o. male.  {Add pertinent medical, surgical, social history, OB history to YEP:67052} Patient had his blood work done by his doctor yesterday and it showed his potassium to be elevated.  He was sent over here for evaluation.  Patient has a history of hypertension   Weakness      Prior to Admission medications   Medication Sig Start Date End Date Taking? Authorizing Provider  Accu-Chek Softclix Lancets lancets Test BS up to QID Dx E11.9 08/23/20   Lunger Mustard, FNP  albuterol  (VENTOLIN  HFA) 108 (90 Base) MCG/ACT inhaler TAKE 2 PUFFS BY MOUTH EVERY 6 HOURS AS NEEDED FOR WHEEZE OR SHORTNESS OF BREATH 02/13/22   Lunger Mustard, FNP  aspirin 81 MG chewable tablet Chew 81 mg by mouth daily.    [provider]  atorvastatin  (LIPITOR) 40 MG tablet Take 1 tablet (40 mg total) by mouth daily. 08/28/23   Lunger Mary-Margaret, FNP  blood glucose meter kit and supplies KIT Dispense based on patient and insurance preference. Use up to four times daily as directed. (FOR ICD-9 250.00, 250.01). 12/03/18   Vann, Jessica U, DO  cholecalciferol (VITAMIN D3) 25 MCG (1000 UNIT) tablet Take 1 tablet (1,000 Units total) by mouth daily. 02/05/20   Joesph Annabella HERO, FNP  clopidogrel  (PLAVIX ) 75 MG tablet Take 1 tablet (75 mg total) by mouth daily. 08/28/23   Lunger Mustard, FNP  cyanocobalamin  (VITAMIN B12) 1000 MCG/ML injection 1 ml weekly for 4 weeks, then every other week for 4 weeks then monthly 05/29/23   Lunger Mustard, FNP  escitalopram  (LEXAPRO ) 20 MG tablet Take 1 tablet (20 mg total) by mouth daily. 08/28/23   Lunger Mary-Margaret, FNP  furosemide  (LASIX ) 20 MG tablet Take 0.5 tablets (10 mg total) by mouth daily. 08/29/23   Lunger Mary-Margaret, FNP  glucose blood (ACCU-CHEK  GUIDE) test strip Test BS up to QID Dx E11.9 08/23/20   Lunger Mustard, FNP  hydrALAZINE  (APRESOLINE ) 100 MG tablet Take 0.5 tablets (50 mg total) by mouth 3 (three) times daily. 07/06/23   Alvan Dorn FALCON, MD  iron  polysaccharides (IFEREX 150) 150 MG capsule Take 1 capsule (150 mg total) by mouth daily. 08/28/23   Lunger Mary-Margaret, FNP  Leuprolide  Acetate, 6 Month, (LUPRON ) 45 MG injection Inject 45 mg into the muscle every 6 (six) months. 06/21/22   [provider]  Magnesium 300 MG CAPS Take by mouth.    [provider]  metoprolol  succinate (TOPROL -XL) 25 MG 24 hr tablet Take 0.5 tablets (12.5 mg total) by mouth daily. 08/28/23   Lunger Mary-Margaret, FNP  olmesartan  (BENICAR ) 40 MG tablet Take 1 tablet (40 mg total) by mouth daily. 08/28/23   Lunger Mary-Margaret, FNP  ondansetron  (ZOFRAN ) 4 MG tablet Take 1 tablet (4 mg total) by mouth every 8 (eight) hours as needed for nausea or vomiting. 05/25/23   Lunger, Mary-Margaret, FNP  pantoprazole  (PROTONIX ) 40 MG tablet Take 1 tablet (40 mg total) by mouth 2 (two) times daily. 08/28/23   Lunger, Mary-Margaret, FNP  QUEtiapine  (SEROQUEL ) 25 MG tablet Take 25 mg by mouth at bedtime. 08/09/23   [provider]  sitaGLIPtin  (JANUVIA ) 50 MG tablet Take 1 tablet (50 mg total) by mouth daily. 08/28/23   Lunger Mary-Margaret, FNP  spironolactone  (ALDACTONE ) 25 MG tablet Take 0.5 tablets (  12.5 mg total) by mouth daily. 07/27/23   Miriam Norris, NP  sucralfate  (CARAFATE ) 1 g tablet Take 1 g by mouth daily as needed. Patient not taking: Reported on 08/28/2023    [provider]  Tiotropium Bromide -Olodaterol 2.5-2.5 MCG/ACT AERS Inhale 2 puffs into the lungs daily. 01/22/23   Darlean Ozell NOVAK, MD  traMADol  (ULTRAM ) 50 MG tablet Take 1 tablet (50 mg total) by mouth 2 (two) times daily. 08/28/23   Gladis Mustard, FNP    Allergies: Nsaids, Aspirin, Sulfa antibiotics, Morphine and codeine, and Norvasc  [amlodipine   besylate]    Review of Systems  Neurological:  Positive for weakness.    Updated Vital Signs BP 135/68   Pulse 66   Temp 98.2 F (36.8 C) (Oral)   Resp 17   SpO2 100%   Physical Exam  (all labs ordered are listed, but only abnormal results are displayed) Labs Reviewed  CBC WITH DIFFERENTIAL/PLATELET - Abnormal; Notable for the following components:      Result Value   RBC 3.66 (*)    Hemoglobin 11.0 (*)    HCT 33.1 (*)    Abs Immature Granulocytes 0.08 (*)    All other components within normal limits  BASIC METABOLIC PANEL WITH GFR - Abnormal; Notable for the following components:   Sodium 133 (*)    Chloride 97 (*)    Glucose, Bld 123 (*)    BUN 35 (*)    Creatinine, Ser 1.56 (*)    Calcium  11.1 (*)    GFR, Estimated 45 (*)    All other components within normal limits    EKG: None  Radiology: No results found.  {Document cardiac monitor, telemetry assessment procedure when appropriate:32947} Procedures   Medications Ordered in the ED - No data to display    {Click here for ABCD2, HEART and other calculators REFRESH Note before signing:1}                              Medical Decision Making Amount and/or Complexity of Data Reviewed Labs: ordered. ECG/medicine tests: ordered.   Patient with dehydration and possibly elevated potassium.  He will hydrate at home and follow-up with his doctor  {Document critical care time when appropriate  Document review of labs and clinical decision tools ie CHADS2VASC2, etc  Document your independent review of radiology images and any outside records  Document your discussion with family members, caretakers and with consultants  Document social determinants of health affecting pt's care  Document your decision making why or why not admission, treatments were needed:32947:::1}   Final diagnoses:  Dehydration    ED Discharge Orders     None

## 2023-08-29 NOTE — Telephone Encounter (Signed)
 Call placed to daughter Sophronia) - critical lab on his Potassium (6.2).    Discussed with Dr. Alvan - advised patient needs to be seen in ED - Zelda Salmon to have this addressed.  She verbalized understanding.

## 2023-08-29 NOTE — Telephone Encounter (Signed)
 Tried to reach Triage but were not available.

## 2023-08-29 NOTE — ED Triage Notes (Signed)
 Pt was sent by PCP for abnormal lab result of K of over 6  No complaints

## 2023-08-30 ENCOUNTER — Ambulatory Visit: Payer: Self-pay | Admitting: Nurse Practitioner

## 2023-08-30 LAB — BASIC METABOLIC PANEL WITH GFR
BUN/Creatinine Ratio: 21 (ref 10–24)
BUN: 39 mg/dL — ABNORMAL HIGH (ref 8–27)
CO2: 23 mmol/L (ref 20–29)
Calcium: 11.5 mg/dL — ABNORMAL HIGH (ref 8.6–10.2)
Chloride: 96 mmol/L (ref 96–106)
Creatinine, Ser: 1.9 mg/dL — ABNORMAL HIGH (ref 0.76–1.27)
Glucose: 117 mg/dL — ABNORMAL HIGH (ref 70–99)
Potassium: 6 mmol/L (ref 3.5–5.2)
Sodium: 134 mmol/L (ref 134–144)
eGFR: 35 mL/min/1.73 — ABNORMAL LOW (ref >59)

## 2023-08-30 LAB — MAGNESIUM: Magnesium: 2.1 mg/dL (ref 1.6–2.3)

## 2023-09-03 ENCOUNTER — Ambulatory Visit: Attending: Nurse Practitioner | Admitting: Nurse Practitioner

## 2023-09-03 ENCOUNTER — Encounter: Payer: Self-pay | Admitting: Nurse Practitioner

## 2023-09-03 VITALS — BP 124/70 | HR 68 | Ht 67.5 in | Wt 182.6 lb

## 2023-09-03 DIAGNOSIS — E785 Hyperlipidemia, unspecified: Secondary | ICD-10-CM

## 2023-09-03 DIAGNOSIS — Z8673 Personal history of transient ischemic attack (TIA), and cerebral infarction without residual deficits: Secondary | ICD-10-CM

## 2023-09-03 DIAGNOSIS — Z79899 Other long term (current) drug therapy: Secondary | ICD-10-CM | POA: Diagnosis not present

## 2023-09-03 DIAGNOSIS — E875 Hyperkalemia: Secondary | ICD-10-CM | POA: Diagnosis not present

## 2023-09-03 DIAGNOSIS — N179 Acute kidney failure, unspecified: Secondary | ICD-10-CM | POA: Diagnosis not present

## 2023-09-03 DIAGNOSIS — I10 Essential (primary) hypertension: Secondary | ICD-10-CM | POA: Diagnosis not present

## 2023-09-03 NOTE — Progress Notes (Unsigned)
 Cardiology Office Note   Date: 09/03/2023 ID:  Bron Snellings, CHARLYNNE [birth year], MRN [id number] PCP: Lunger Mustard, FNP  [facility] Providers Cardiologist:  Alvan Carrier, MD     History of Present Illness Ruby Dilone is a 78 y.o. male with a PMH of hypertension, hyperlipidemia, past history of CVA, s/p left CEA, PVCs, asthma, GERD, HLD, and vertigo, presents today for BP evaluation. He is a former smoker.   Last seen by Dr. Carrier Alvan on April 23, 2023. BP was above goal.  He was started on chlorthalidone  12.5 mg every other day to start.  Was noted he had prior issues with low blood pressures on hydrochlorothiazide  years ago.  Could not tolerate Norvasc  in the past due to leg edema.  Also with his history of low normal heart rates, was recommended to not titrate Toprol .  Ultimately chlorthalidone  caused some AKI, this medicine was discontinued and due to low magnesium he was started on magnesium oxide 400 mg twice daily x 5 days, then instructed to stop by Dr. Alvan.  His PCP stated that he need to be on a daily magnesium supplement and recommended to get one over-the-counter.  07/23/2023-today he presents for hypertension evaluation with his daughter.  He states while working with physical therapy, they have noticed low BP readings. SBP 99-102, was symptomatic with this. Medications were adjusted by another provider and still continued to experience dizziness, unable to walk. Our office was contacted and Dr. Alvan stated to stop the diuretic - see recent telephone notes. After medication has been adjusted recently, he is feeling better. Denies any dizziness or weakness. Denies any orthostatic symptoms. Does occasionally have SBP listed as 104, but overall doing well. He has not had repeat blood work performed. Denies any chest pain, shortness of breath, palpitations, syncope, presyncope, dizziness, orthopnea, PND, swelling or significant weight changes, acute bleeding, or  claudication.  ED visit on August 29, 2023 -presented to ED after potassium level was found to be elevated after blood work showed it to be 6.0 and 6.2. serum creatinine was found to be 1.90, had AKI. Most recent K+ level returned to normal with improvement in sCr.   Today he presents for follow-up with his daughter. Feeling much better since I last saw him per his report.  He is continuing to take spironolactone . Denies any chest pain, shortness of breath, palpitations, syncope, presyncope, dizziness, orthopnea, PND, swelling or significant weight changes, acute bleeding, or claudication.   ROS: Negative. See HPI.   Studies Reviewed  EKG: EKG is not ordered today.   Cardiac monitor report 12/2021:  See file under CV Proc  Lexiscan  11/2019:  There was no ST segment deviation noted during stress. The left ventricular ejection fraction is normal (55-65%). Nuclear stress EF: 60%. Defect 1: There is a medium fixed defect of moderate severity present in the basal inferoseptal, basal inferior, mid inferoseptal, mid inferior and apical inferior location. Given fixed defect with normal wall motion in this area, consistent with artifact No T wave inversion was noted during stress. The study is normal. This is a low risk study.  Echo 11/2018:   1. Left ventricular ejection fraction, by visual estimation, is 60 to  65%. The left ventricle has normal function. There is no left ventricular  hypertrophy.   2. The left ventricle has no regional wall motion abnormalities.   3. Global right ventricle has normal systolic function.The right  ventricular size is normal. No increase in right ventricular wall  thickness.  4. Left atrial size was normal.   5. Right atrial size was normal.   6. The mitral valve is normal in structure. No evidence of mitral valve  regurgitation. No evidence of mitral stenosis.   7. The tricuspid valve is normal in structure. Tricuspid valve  regurgitation is not  demonstrated.   8. The aortic valve is normal in structure. Aortic valve regurgitation is  not visualized. No evidence of aortic valve sclerosis or stenosis.   9. The pulmonic valve was normal in structure. Pulmonic valve  regurgitation is not visualized.  10. The inferior vena cava is normal in size with greater than 50%  respiratory variability, suggesting right atrial pressure of 3 mmHg.   Physical Exam VS:  BP 124/70   Pulse 68   Ht 5' 7.5 (1.715 m)   Wt 182 lb 9.6 oz (82.8 kg)   SpO2 99%   BMI 28.18 kg/m   Wt Readings from Last 3 Encounters:  09/03/23 182 lb 9.6 oz (82.8 kg)  08/28/23 181 lb (82.1 kg)  07/23/23 178 lb 12.8 oz (81.1 kg)    GEN: Well nourished, well developed in no acute distress NECK: No JVD; No carotid bruits CARDIAC: S1/S2, RRR, no murmurs, rubs, gallops RESPIRATORY:  Clear to auscultation without rales, wheezing or rhonchi  ABDOMEN: Soft, non-tender, non-distended EXTREMITIES:  No edema; No deformity   ASSESSMENT AND PLAN  HTN BP is stable. Will discontinue his spironolactone  due to his history of AKI and hyperkalemia.  Discussed to monitor BP at home at least 2 hours after medications and sitting for 5-10 minutes. No medication changes at this time. Heart healthy diet and regular cardiovascular exercise encouraged.  Will repeat BMET in 2 to 3 weeks-see below.   HLD Most recent LDL 30 05/2023. Continue atorvastatin . Heart healthy diet and regular cardiovascular exercise encouraged.   Hx of CVA, s/p left CEA Denies any recent symptoms. Continue current medication regimen. Heart healthy diet and regular cardiovascular exercise encouraged.   AKI, hyperkalemia, medication management Most recent sCr was 1.56  with eGFR of 45 in setting of dehydration and diuretic use.  Will discontinue spironolactone  due to his history of AKI and hyperkalemia.  Most recent serum creatinine shows improvement in kidney function-still elevated.  Will repeat BMET in 2 to 3  weeks-encouraged him to stay well-hydrated.  Continue to follow with PCP.   I spent a total duration of 30 minutes reviewing prior notes, reviewing outside records including  labs, face-to-face counseling of medical condition, pathophysiology, evaluation, management, and documenting the findings in the note.    Dispo: Follow-up with MD/APP in 6 months or sooner if anything changes.   Signed, Almarie Crate, NP

## 2023-09-03 NOTE — Patient Instructions (Addendum)
 Medication Instructions:  Your physician has recommended you make the following change in your medication:  Please stop Spironolactone    Labwork: In 3 weeks with [facility]   Testing/Procedures: None   Follow-Up: Your physician recommends that you schedule a follow-up appointment in: 6 Months   Any Other Special Instructions Will Be Listed Below (If Applicable).  If you need a refill on your cardiac medications before your next appointment, please call your pharmacy.

## 2023-09-24 ENCOUNTER — Other Ambulatory Visit

## 2023-09-24 DIAGNOSIS — Z79899 Other long term (current) drug therapy: Secondary | ICD-10-CM | POA: Diagnosis not present

## 2023-09-24 DIAGNOSIS — N179 Acute kidney failure, unspecified: Secondary | ICD-10-CM | POA: Diagnosis not present

## 2023-09-24 DIAGNOSIS — E875 Hyperkalemia: Secondary | ICD-10-CM | POA: Diagnosis not present

## 2023-09-25 LAB — BASIC METABOLIC PANEL WITH GFR
BUN/Creatinine Ratio: 16 (ref 10–24)
BUN: 26 mg/dL (ref 8–27)
CO2: 23 mmol/L (ref 20–29)
Calcium: 10.7 mg/dL — ABNORMAL HIGH (ref 8.6–10.2)
Chloride: 98 mmol/L (ref 96–106)
Creatinine, Ser: 1.58 mg/dL — ABNORMAL HIGH (ref 0.76–1.27)
Glucose: 115 mg/dL — ABNORMAL HIGH (ref 70–99)
Potassium: 5.4 mmol/L — ABNORMAL HIGH (ref 3.5–5.2)
Sodium: 136 mmol/L (ref 134–144)
eGFR: 44 mL/min/1.73 — ABNORMAL LOW (ref >59)

## 2023-10-01 ENCOUNTER — Ambulatory Visit: Payer: Self-pay | Admitting: Nurse Practitioner

## 2023-10-01 ENCOUNTER — Other Ambulatory Visit: Payer: Self-pay | Admitting: Nurse Practitioner

## 2023-10-01 DIAGNOSIS — E875 Hyperkalemia: Secondary | ICD-10-CM

## 2023-10-01 NOTE — Progress Notes (Signed)
 Called daughter and reviewed instructions about meds Stop spirolactone No potassium supplement Hold omesartan fro 3 days then start back at 20mg  daily Repeat CMP on Friday- scheduled and orders placed. Mary-Margaret Gladis, FNP

## 2023-10-05 ENCOUNTER — Other Ambulatory Visit

## 2023-10-05 DIAGNOSIS — E875 Hyperkalemia: Secondary | ICD-10-CM

## 2023-10-06 LAB — CMP14+EGFR
ALT: 10 IU/L (ref 0–44)
AST: 11 IU/L (ref 0–40)
Albumin: 4.2 g/dL (ref 3.8–4.8)
Alkaline Phosphatase: 72 IU/L (ref 47–123)
BUN/Creatinine Ratio: 15 (ref 10–24)
BUN: 19 mg/dL (ref 8–27)
Bilirubin Total: 0.2 mg/dL (ref 0.0–1.2)
CO2: 24 mmol/L (ref 20–29)
Calcium: 10.8 mg/dL — ABNORMAL HIGH (ref 8.6–10.2)
Chloride: 100 mmol/L (ref 96–106)
Creatinine, Ser: 1.26 mg/dL (ref 0.76–1.27)
Globulin, Total: 2.1 g/dL (ref 1.5–4.5)
Glucose: 114 mg/dL — ABNORMAL HIGH (ref 70–99)
Potassium: 4.8 mmol/L (ref 3.5–5.2)
Sodium: 137 mmol/L (ref 134–144)
Total Protein: 6.3 g/dL (ref 6.0–8.5)
eGFR: 58 mL/min/1.73 — ABNORMAL LOW (ref >59)

## 2023-10-08 ENCOUNTER — Ambulatory Visit: Payer: Self-pay | Admitting: Nurse Practitioner

## 2023-10-08 DIAGNOSIS — L570 Actinic keratosis: Secondary | ICD-10-CM | POA: Diagnosis not present

## 2023-10-08 DIAGNOSIS — L821 Other seborrheic keratosis: Secondary | ICD-10-CM | POA: Diagnosis not present

## 2023-10-08 DIAGNOSIS — Z85828 Personal history of other malignant neoplasm of skin: Secondary | ICD-10-CM | POA: Diagnosis not present

## 2023-10-08 DIAGNOSIS — L814 Other melanin hyperpigmentation: Secondary | ICD-10-CM | POA: Diagnosis not present

## 2023-10-17 ENCOUNTER — Ambulatory Visit

## 2023-10-17 ENCOUNTER — Ambulatory Visit (INDEPENDENT_AMBULATORY_CARE_PROVIDER_SITE_OTHER): Admitting: *Deleted

## 2023-10-17 DIAGNOSIS — Z23 Encounter for immunization: Secondary | ICD-10-CM

## 2023-10-25 ENCOUNTER — Other Ambulatory Visit: Payer: Self-pay | Admitting: Nurse Practitioner

## 2023-10-25 DIAGNOSIS — R0609 Other forms of dyspnea: Secondary | ICD-10-CM

## 2023-11-27 ENCOUNTER — Encounter: Payer: Self-pay | Admitting: Nurse Practitioner

## 2023-11-27 ENCOUNTER — Ambulatory Visit: Payer: Self-pay | Admitting: Nurse Practitioner

## 2023-11-27 VITALS — BP 103/52 | HR 63 | Temp 98.2°F | Ht 67.0 in | Wt 186.0 lb

## 2023-11-27 DIAGNOSIS — Z8546 Personal history of malignant neoplasm of prostate: Secondary | ICD-10-CM

## 2023-11-27 DIAGNOSIS — L4050 Arthropathic psoriasis, unspecified: Secondary | ICD-10-CM

## 2023-11-27 MED ORDER — TRAMADOL HCL 50 MG PO TABS: 50.0000 mg | ORAL_TABLET | Freq: Two times a day (BID) | ORAL | 2 refills | Status: AC

## 2023-11-27 NOTE — Progress Notes (Signed)
 Subjective:    Patient ID: Rodney Hernandez, male    DOB: 01-30-1945, 78 y.o.   MRN: [id number]   Chief Complaint: pain management  HPI  Pain assessment: Cause of pain- psoriatic arthritis Pain location- joints Pain on scale of 1-10- 0/10 today Frequency- daily What increases pain-to much activity What makes pain Better-rest helps Effects on ADL - none Any change in general medical condition-none  Current opioids rx- ultram  50mg  BID # meds rx- 60 Effectiveness of current meds-helps Adverse reactions from pain meds-none Morphine equivalent- 10 MME  Pill count performed-No Last drug screen - never ( high risk q53m, moderate risk q60m, low risk yearly ) Urine drug screen today- Yes Was the NCCSR reviewed- yes  If yes were their any concerning findings? - no   Overdose risk: 1    Pain contract signed on: 11/27/23  Urologist needs PSA checked today Patient Active Problem List   Diagnosis Date Noted   COPD  GOLD 2 11/23/2022   Depression, major, single episode, moderate (HCC) 11/11/2021   History of CVA (cerebrovascular accident) 05/06/2020   Diabetes mellitus treated with oral medication (HCC) 05/06/2020   History of left-sided carotid endarterectomy 02/04/2020   Anemia 02/04/2020   Carotid stenosis, symptomatic, with infarction (HCC) 01/22/2020   Peripheral edema 08/24/2016   Aortic atherosclerosis 07/20/2015   Hyperlipidemia associated with type 2 diabetes mellitus (HCC) 05/13/2015   Psoriasis 05/13/2015   Hypogonadism in male 06/11/2014   GERD (gastroesophageal reflux disease) 05/20/2012   Essential hypertension 05/20/2012   Eczema 05/20/2012   Asthma, chronic 05/20/2012   BPH (benign prostatic hyperplasia) 05/20/2012   Psoriatic arthritis (HCC) 05/20/2012   Helicobacter positive gastritis 05/20/2012   History of adenomatous polyp of colon 08/09/2011   Umbilical hernia 06/27/2011   Acquired cyst of kidney 07/27/2009   Arthropathy 01/11/2009       Review  of Systems  Constitutional:  Negative for diaphoresis.  Eyes:  Negative for pain.  Respiratory:  Negative for shortness of breath.   Cardiovascular:  Negative for chest pain, palpitations and leg swelling.  Gastrointestinal:  Negative for abdominal pain.  Endocrine: Negative for polydipsia.  Skin:  Negative for rash.  Neurological:  Negative for dizziness, weakness and headaches.  Hematological:  Does not bruise/bleed easily.  All other systems reviewed and are negative.      Objective:   Physical Exam Constitutional:      Appearance: Normal appearance.  Cardiovascular:     Rate and Rhythm: Normal rate and regular rhythm.     Heart sounds: Normal heart sounds.  Pulmonary:     Effort: Pulmonary effort is normal.     Breath sounds: Normal breath sounds.  Skin:    General: Skin is warm.  Neurological:     General: No focal deficit present.     Mental Status: He is alert and oriented to person, place, and time.  Psychiatric:        Mood and Affect: Mood normal.        Behavior: Behavior normal.     BP (!) 103/52   Pulse 63   Temp 98.2 F (36.8 C) (Temporal)   Ht 5' 7 (1.702 m)   Wt 186 lb (84.4 kg)   SpO2 92%   BMI 29.13 kg/m        Assessment & Plan:   Rodney Hernandez in today with chief complaint of No chief complaint on file.   1. Psoriatic arthritis (HCC) Moist heat as needed - ToxASSURE Select 13 (  MW), Urine - traMADol  (ULTRAM ) 50 MG tablet; Take 1 tablet (50 mg total) by mouth 2 (two) times daily.  Dispense: 60 tablet; Refill: 2  2. History of prostate cancer (Primary) Labs done for urology - PSA, total and free    The above assessment and management plan was discussed with the patient. The patient verbalized understanding of and has agreed to the management plan. Patient is aware to call the clinic if symptoms persist or worsen. Patient is aware when to return to the clinic for a follow-up visit. Patient educated on when it is appropriate to go to the  emergency department.   Mary-Margaret Gladis, FNP

## 2023-11-28 LAB — PSA, TOTAL AND FREE
PSA, Free Pct: 10 %
PSA, Free: 0.04 ng/mL
Prostate Specific Ag, Serum: 0.4 ng/mL (ref 0.0–4.0)

## 2023-11-29 ENCOUNTER — Ambulatory Visit: Payer: Self-pay | Admitting: Nurse Practitioner

## 2023-11-29 NOTE — Telephone Encounter (Signed)
 Daughter Rayfield is aware and result forwarded to [facility] as per her request.

## 2023-11-30 LAB — TOXASSURE SELECT 13 (MW), URINE

## 2023-12-07 ENCOUNTER — Other Ambulatory Visit: Payer: Self-pay | Admitting: Internal Medicine

## 2023-12-07 ENCOUNTER — Other Ambulatory Visit: Payer: Self-pay | Admitting: Nurse Practitioner

## 2023-12-07 DIAGNOSIS — E538 Deficiency of other specified B group vitamins: Secondary | ICD-10-CM

## 2023-12-10 NOTE — Telephone Encounter (Signed)
 Stiolto refill - pt will need appt for further refills.

## 2023-12-12 DIAGNOSIS — N400 Enlarged prostate without lower urinary tract symptoms: Secondary | ICD-10-CM | POA: Diagnosis not present

## 2024-01-31 ENCOUNTER — Other Ambulatory Visit: Payer: Self-pay | Admitting: Internal Medicine

## 2024-02-01 ENCOUNTER — Telehealth: Payer: Self-pay

## 2024-02-01 ENCOUNTER — Other Ambulatory Visit: Payer: Self-pay

## 2024-02-01 NOTE — Telephone Encounter (Signed)
 Copied from CRM #[id number]. Topic: Clinical - Prescription Issue >> Jan 31, 2024 11:55 AM Rodney Hernandez wrote: Reason for CRM:   Pt's daughter Rodney Hernandez, is calling clinic regarding his prescribed Tiotropium Bromide -Olodaterol (STIOLTO RESPIMAT ) 2.5-2.5 MCG/ACT AERS.   Pt is in need of an emergency refill of this medication if possible. He inserted the cartridge into his inhaler yesterday and did not realize he inserted in the wrong way. The cartridge was punctured and the medicine leaked out.   Daughter contacted pharmacy and they advised reaching out to clinic to request emergency prescription to cover him until his next refill  Requested call back  CB#  Sent to pharm  [phone](Laura) >> Feb 01, 2024 12:52 PM Dedra B wrote: Patient's daughter Rodney Hernandez calling again to follow up on this. >> Feb 01, 2024 10:53 AM Dedra B wrote: Patient daughter Rodney Hernandez calling to follow up again.  >> Jan 31, 2024  3:31 PM Benton KIDD wrote: Patient daughter is calling back concerning the issue with patient inhaler . Patient  will need inhaler before the weather gets bad . Please reach out to patient daughter . Patient punctured the inhaler and it does not have any more medicine in the inhaler . Patient is needing another inhaler because the incident with the other one left him with no medication and the daughters are worried .

## 2024-02-25 ENCOUNTER — Ambulatory Visit: Admitting: Nurse Practitioner

## 2024-02-26 ENCOUNTER — Ambulatory Visit: Admitting: Internal Medicine

## 2024-03-06 ENCOUNTER — Ambulatory Visit: Admitting: Nurse Practitioner

## 2024-06-27 ENCOUNTER — Ambulatory Visit: Payer: Self-pay
